# Patient Record
Sex: Female | Born: 1935 | Race: White | Hispanic: No | Marital: Married | Smoking: Never smoker
Health system: Southern US, Community
[De-identification: ages and names within clinical notes are randomized; demographics above are authoritative.]

## PROBLEM LIST (undated history)

## (undated) DIAGNOSIS — Z950 Presence of cardiac pacemaker: Secondary | ICD-10-CM

## (undated) DIAGNOSIS — I2510 Atherosclerotic heart disease of native coronary artery without angina pectoris: Secondary | ICD-10-CM

## (undated) DIAGNOSIS — Z8719 Personal history of other diseases of the digestive system: Secondary | ICD-10-CM

## (undated) DIAGNOSIS — D649 Anemia, unspecified: Secondary | ICD-10-CM

## (undated) DIAGNOSIS — J029 Acute pharyngitis, unspecified: Secondary | ICD-10-CM

## (undated) DIAGNOSIS — I10 Essential (primary) hypertension: Secondary | ICD-10-CM

## (undated) DIAGNOSIS — E785 Hyperlipidemia, unspecified: Secondary | ICD-10-CM

## (undated) DIAGNOSIS — R0989 Other specified symptoms and signs involving the circulatory and respiratory systems: Secondary | ICD-10-CM

## (undated) DIAGNOSIS — K219 Gastro-esophageal reflux disease without esophagitis: Secondary | ICD-10-CM

## (undated) DIAGNOSIS — K922 Gastrointestinal hemorrhage, unspecified: Secondary | ICD-10-CM

## (undated) DIAGNOSIS — I480 Paroxysmal atrial fibrillation: Secondary | ICD-10-CM

## (undated) DIAGNOSIS — I447 Left bundle-branch block, unspecified: Secondary | ICD-10-CM

## (undated) HISTORY — PX: COLONOSCOPY: SHX174

## (undated) HISTORY — DX: Paroxysmal atrial fibrillation: I48.0

## (undated) HISTORY — PX: CORONARY ANGIOPLASTY: SHX604

## (undated) HISTORY — DX: Gastro-esophageal reflux disease without esophagitis: K21.9

## (undated) HISTORY — DX: Essential (primary) hypertension: I10

## (undated) HISTORY — DX: Gastrointestinal hemorrhage, unspecified: K92.2

## (undated) HISTORY — PX: CATARACT EXTRACTION W/ INTRAOCULAR LENS  IMPLANT, BILATERAL: SHX1307

## (undated) HISTORY — PX: EYE SURGERY: SHX253

## (undated) HISTORY — DX: Left bundle-branch block, unspecified: I44.7

## (undated) HISTORY — DX: Hyperlipidemia, unspecified: E78.5

## (undated) HISTORY — DX: Other specified symptoms and signs involving the circulatory and respiratory systems: R09.89

## (undated) HISTORY — DX: Anemia, unspecified: D64.9

## (undated) HISTORY — DX: Atherosclerotic heart disease of native coronary artery without angina pectoris: I25.10

## (undated) HISTORY — PX: ESOPHAGOGASTRECTOMY: SHX1528

## (undated) SURGERY — ECHOCARDIOGRAM, TRANSESOPHAGEAL
Anesthesia: Moderate Sedation

---

## 1999-05-07 ENCOUNTER — Other Ambulatory Visit: Admission: RE | Admit: 1999-05-07 | Discharge: 1999-05-07 | Payer: Self-pay | Admitting: Internal Medicine

## 2002-05-25 ENCOUNTER — Ambulatory Visit (HOSPITAL_COMMUNITY): Admission: RE | Admit: 2002-05-25 | Discharge: 2002-05-25 | Payer: Self-pay

## 2002-07-22 ENCOUNTER — Other Ambulatory Visit: Admission: RE | Admit: 2002-07-22 | Discharge: 2002-07-22 | Payer: Self-pay | Admitting: Internal Medicine

## 2005-12-10 ENCOUNTER — Ambulatory Visit (HOSPITAL_COMMUNITY): Admission: RE | Admit: 2005-12-10 | Discharge: 2005-12-10 | Payer: Self-pay | Admitting: *Deleted

## 2006-08-07 ENCOUNTER — Inpatient Hospital Stay (HOSPITAL_COMMUNITY): Admission: EM | Admit: 2006-08-07 | Discharge: 2006-08-09 | Payer: Self-pay | Admitting: Emergency Medicine

## 2006-08-11 ENCOUNTER — Other Ambulatory Visit: Admission: RE | Admit: 2006-08-11 | Discharge: 2006-08-11 | Payer: Self-pay | Admitting: Internal Medicine

## 2006-09-10 ENCOUNTER — Encounter (INDEPENDENT_AMBULATORY_CARE_PROVIDER_SITE_OTHER): Payer: Self-pay | Admitting: *Deleted

## 2006-09-10 ENCOUNTER — Ambulatory Visit (HOSPITAL_COMMUNITY): Admission: RE | Admit: 2006-09-10 | Discharge: 2006-09-10 | Payer: Self-pay | Admitting: *Deleted

## 2008-01-26 ENCOUNTER — Ambulatory Visit (HOSPITAL_COMMUNITY): Admission: RE | Admit: 2008-01-26 | Discharge: 2008-01-26 | Payer: Self-pay | Admitting: *Deleted

## 2008-01-26 ENCOUNTER — Encounter (INDEPENDENT_AMBULATORY_CARE_PROVIDER_SITE_OTHER): Payer: Self-pay | Admitting: *Deleted

## 2008-10-18 ENCOUNTER — Ambulatory Visit (HOSPITAL_COMMUNITY): Admission: RE | Admit: 2008-10-18 | Discharge: 2008-10-18 | Payer: Self-pay | Admitting: *Deleted

## 2008-11-03 ENCOUNTER — Inpatient Hospital Stay (HOSPITAL_COMMUNITY): Admission: EM | Admit: 2008-11-03 | Discharge: 2008-11-04 | Payer: Self-pay | Admitting: Emergency Medicine

## 2008-11-08 ENCOUNTER — Encounter: Admission: RE | Admit: 2008-11-08 | Discharge: 2008-11-08 | Payer: Self-pay | Admitting: *Deleted

## 2009-10-03 ENCOUNTER — Other Ambulatory Visit: Admission: RE | Admit: 2009-10-03 | Discharge: 2009-10-03 | Payer: Self-pay | Admitting: Internal Medicine

## 2010-04-30 ENCOUNTER — Inpatient Hospital Stay (HOSPITAL_COMMUNITY): Admission: EM | Admit: 2010-04-30 | Discharge: 2010-04-30 | Payer: Self-pay | Admitting: Emergency Medicine

## 2010-04-30 ENCOUNTER — Encounter (INDEPENDENT_AMBULATORY_CARE_PROVIDER_SITE_OTHER): Payer: Self-pay | Admitting: Internal Medicine

## 2010-05-16 ENCOUNTER — Encounter: Admission: RE | Admit: 2010-05-16 | Discharge: 2010-05-16 | Payer: Self-pay | Admitting: Internal Medicine

## 2010-05-21 ENCOUNTER — Inpatient Hospital Stay (HOSPITAL_COMMUNITY): Admission: EM | Admit: 2010-05-21 | Discharge: 2010-05-23 | Payer: Self-pay | Admitting: Emergency Medicine

## 2010-07-23 ENCOUNTER — Encounter: Payer: Self-pay | Admitting: *Deleted

## 2010-09-11 LAB — CBC
HCT: 21.8 % — ABNORMAL LOW (ref 36.0–46.0)
HCT: 27 % — ABNORMAL LOW (ref 36.0–46.0)
HCT: 27.8 % — ABNORMAL LOW (ref 36.0–46.0)
HCT: 28.9 % — ABNORMAL LOW (ref 36.0–46.0)
Hemoglobin: 7.4 g/dL — ABNORMAL LOW (ref 12.0–15.0)
Hemoglobin: 9.1 g/dL — ABNORMAL LOW (ref 12.0–15.0)
Hemoglobin: 9.4 g/dL — ABNORMAL LOW (ref 12.0–15.0)
Hemoglobin: 9.8 g/dL — ABNORMAL LOW (ref 12.0–15.0)
MCH: 29.3 pg (ref 26.0–34.0)
MCH: 29.4 pg (ref 26.0–34.0)
MCH: 29.4 pg (ref 26.0–34.0)
MCH: 29.7 pg (ref 26.0–34.0)
MCH: 31.1 pg (ref 26.0–34.0)
MCHC: 33.7 g/dL (ref 30.0–36.0)
MCHC: 33.8 g/dL (ref 30.0–36.0)
MCHC: 33.9 g/dL (ref 30.0–36.0)
MCV: 86.8 fL (ref 78.0–100.0)
MCV: 86.8 fL (ref 78.0–100.0)
MCV: 87.3 fL (ref 78.0–100.0)
MCV: 87.7 fL (ref 78.0–100.0)
MCV: 91.6 fL (ref 78.0–100.0)
Platelets: 178 K/uL (ref 150–400)
Platelets: 180 K/uL (ref 150–400)
Platelets: 196 10*3/uL (ref 150–400)
Platelets: 207 K/uL (ref 150–400)
RBC: 2.38 MIL/uL — ABNORMAL LOW (ref 3.87–5.11)
RBC: 3.11 MIL/uL — ABNORMAL LOW (ref 3.87–5.11)
RBC: 3.17 MIL/uL — ABNORMAL LOW (ref 3.87–5.11)
RBC: 3.3 MIL/uL — ABNORMAL LOW (ref 3.87–5.11)
RBC: 3.33 MIL/uL — ABNORMAL LOW (ref 3.87–5.11)
RDW: 13.4 % (ref 11.5–15.5)
RDW: 16.7 % — ABNORMAL HIGH (ref 11.5–15.5)
RDW: 17 % — ABNORMAL HIGH (ref 11.5–15.5)
WBC: 5.3 K/uL (ref 4.0–10.5)
WBC: 5.4 K/uL (ref 4.0–10.5)
WBC: 5.7 K/uL (ref 4.0–10.5)

## 2010-09-11 LAB — CROSSMATCH
ABO/RH(D): B POS
Antibody Screen: NEGATIVE
Unit division: 0
Unit division: 0

## 2010-09-11 LAB — COMPREHENSIVE METABOLIC PANEL
ALT: 13 U/L (ref 0–35)
Albumin: 3.1 g/dL — ABNORMAL LOW (ref 3.5–5.2)
Creatinine, Ser: 0.51 mg/dL (ref 0.4–1.2)
Total Bilirubin: 2.6 mg/dL — ABNORMAL HIGH (ref 0.3–1.2)

## 2010-09-11 LAB — COMPREHENSIVE METABOLIC PANEL WITH GFR
ALT: 15 U/L (ref 0–35)
AST: 23 U/L (ref 0–37)
Albumin: 3.7 g/dL (ref 3.5–5.2)
Alkaline Phosphatase: 47 U/L (ref 39–117)
BUN: 19 mg/dL (ref 6–23)
CO2: 24 meq/L (ref 19–32)
Calcium: 9.3 mg/dL (ref 8.4–10.5)
Chloride: 111 meq/L (ref 96–112)
Creatinine, Ser: 0.61 mg/dL (ref 0.4–1.2)
GFR calc Af Amer: >60 mL/min (ref >60)
GFR calc non Af Amer: >60 mL/min (ref >60)
Glucose, Bld: 97 mg/dL (ref 70–99)
Potassium: 3.7 meq/L (ref 3.5–5.1)
Sodium: 140 meq/L (ref 135–145)
Total Bilirubin: 0.6 mg/dL (ref 0.3–1.2)
Total Protein: 6.3 g/dL (ref 6.0–8.3)

## 2010-09-11 LAB — URINALYSIS, ROUTINE W REFLEX MICROSCOPIC
Bilirubin Urine: NEGATIVE
Glucose, UA: NEGATIVE mg/dL
Hgb urine dipstick: NEGATIVE
Ketones, ur: NEGATIVE mg/dL
Nitrite: NEGATIVE
Protein, ur: NEGATIVE mg/dL
Specific Gravity, Urine: 1.008 (ref 1.005–1.030)
Urobilinogen, UA: 0.2 mg/dL (ref 0.0–1.0)
pH: 7 (ref 5.0–8.0)

## 2010-09-11 LAB — BASIC METABOLIC PANEL
BUN: 14 mg/dL (ref 6–23)
CO2: 24 mEq/L (ref 19–32)
Chloride: 113 mEq/L — ABNORMAL HIGH (ref 96–112)
Creatinine, Ser: 0.6 mg/dL (ref 0.4–1.2)
GFR calc Af Amer: >60 mL/min (ref >60)
Glucose, Bld: 103 mg/dL — ABNORMAL HIGH (ref 70–99)

## 2010-09-11 LAB — DIFFERENTIAL
Basophils Absolute: 0 K/uL (ref 0.0–0.1)
Basophils Relative: 0 % (ref 0–1)
Eosinophils Absolute: 0.1 K/uL (ref 0.0–0.7)
Eosinophils Relative: 1 % (ref 0–5)
Lymphocytes Relative: 37 % (ref 12–46)
Lymphs Abs: 2.1 K/uL (ref 0.7–4.0)
Monocytes Absolute: 0.4 K/uL (ref 0.1–1.0)
Monocytes Relative: 7 % (ref 3–12)
Neutro Abs: 3.1 K/uL (ref 1.7–7.7)
Neutrophils Relative %: 55 % (ref 43–77)

## 2010-09-11 LAB — GLUCOSE, CAPILLARY
Glucose-Capillary: 109 mg/dL — ABNORMAL HIGH (ref 70–99)
Glucose-Capillary: 114 mg/dL — ABNORMAL HIGH (ref 70–99)
Glucose-Capillary: 91 mg/dL (ref 70–99)

## 2010-09-11 LAB — PROTIME-INR
INR: 1.07 (ref 0.00–1.49)
INR: 1.14 (ref 0.00–1.49)
Prothrombin Time: 14.1 s (ref 11.6–15.2)
Prothrombin Time: 14.8 seconds (ref 11.6–15.2)

## 2010-09-11 LAB — CARDIAC PANEL(CRET KIN+CKTOT+MB+TROPI)
CK, MB: 1.3 ng/mL (ref 0.3–4.0)
CK, MB: 1.4 ng/mL (ref 0.3–4.0)
Relative Index: INVALID (ref 0.0–2.5)
Total CK: 55 U/L (ref 7–177)
Total CK: 57 U/L (ref 7–177)
Troponin I: 0.01 ng/mL (ref 0.00–0.06)

## 2010-09-11 LAB — TSH: TSH: 2.522 u[IU]/mL (ref 0.350–4.500)

## 2010-09-11 LAB — APTT: aPTT: 37 s (ref 24–37)

## 2010-09-11 LAB — ABO/RH: ABO/RH(D): B POS

## 2010-09-12 LAB — DIFFERENTIAL
Eosinophils Absolute: 0.1 10*3/uL (ref 0.0–0.7)
Eosinophils Relative: 2 % (ref 0–5)
Lymphs Abs: 4.1 10*3/uL — ABNORMAL HIGH (ref 0.7–4.0)
Monocytes Absolute: 0.5 10*3/uL (ref 0.1–1.0)
Monocytes Relative: 6 % (ref 3–12)

## 2010-09-12 LAB — BASIC METABOLIC PANEL
BUN: 10 mg/dL (ref 6–23)
Chloride: 107 mEq/L (ref 96–112)
Creatinine, Ser: 0.64 mg/dL (ref 0.4–1.2)

## 2010-09-12 LAB — CBC
MCH: 30.3 pg (ref 26.0–34.0)
MCV: 91.5 fL (ref 78.0–100.0)
Platelets: 287 10*3/uL (ref 150–400)
RDW: 13.2 % (ref 11.5–15.5)
WBC: 8 10*3/uL (ref 4.0–10.5)

## 2010-09-12 LAB — CARDIAC PANEL(CRET KIN+CKTOT+MB+TROPI)
Relative Index: INVALID (ref 0.0–2.5)
Total CK: 76 U/L (ref 7–177)

## 2010-09-12 LAB — HEPARIN LEVEL (UNFRACTIONATED): Heparin Unfractionated: 0.81 IU/mL — ABNORMAL HIGH (ref 0.30–0.70)

## 2010-09-12 LAB — APTT: aPTT: 29 seconds (ref 24–37)

## 2010-09-12 LAB — BRAIN NATRIURETIC PEPTIDE: Pro B Natriuretic peptide (BNP): 125 pg/mL — ABNORMAL HIGH (ref 0.0–100.0)

## 2010-09-12 LAB — PROTIME-INR: INR: 0.98 (ref 0.00–1.49)

## 2010-09-12 LAB — LIPID PANEL: Cholesterol: 149 mg/dL (ref 0–200)

## 2010-09-13 ENCOUNTER — Inpatient Hospital Stay (HOSPITAL_COMMUNITY)
Admission: RE | Admit: 2010-09-13 | Discharge: 2010-09-17 | DRG: 310 | Disposition: A | Discharge status: Home / Self Care | Payer: Medicare Other | Source: Ambulatory Visit | Attending: Cardiology | Admitting: Cardiology

## 2010-09-13 DIAGNOSIS — Z7982 Long term (current) use of aspirin: Secondary | ICD-10-CM

## 2010-09-13 DIAGNOSIS — I10 Essential (primary) hypertension: Secondary | ICD-10-CM | POA: Diagnosis present

## 2010-09-13 DIAGNOSIS — I447 Left bundle-branch block, unspecified: Secondary | ICD-10-CM | POA: Diagnosis present

## 2010-09-13 DIAGNOSIS — Z9861 Coronary angioplasty status: Secondary | ICD-10-CM | POA: Diagnosis present

## 2010-09-13 DIAGNOSIS — I4891 Unspecified atrial fibrillation: Principal | ICD-10-CM | POA: Diagnosis present

## 2010-09-13 DIAGNOSIS — H409 Unspecified glaucoma: Secondary | ICD-10-CM | POA: Diagnosis present

## 2010-09-13 DIAGNOSIS — I2510 Atherosclerotic heart disease of native coronary artery without angina pectoris: Secondary | ICD-10-CM | POA: Diagnosis present

## 2010-09-13 DIAGNOSIS — E876 Hypokalemia: Secondary | ICD-10-CM | POA: Diagnosis present

## 2010-09-13 DIAGNOSIS — I498 Other specified cardiac arrhythmias: Secondary | ICD-10-CM | POA: Diagnosis present

## 2010-09-13 DIAGNOSIS — K219 Gastro-esophageal reflux disease without esophagitis: Secondary | ICD-10-CM | POA: Diagnosis present

## 2010-09-13 DIAGNOSIS — E785 Hyperlipidemia, unspecified: Secondary | ICD-10-CM | POA: Diagnosis present

## 2010-09-13 DIAGNOSIS — Z79899 Other long term (current) drug therapy: Secondary | ICD-10-CM

## 2010-09-13 LAB — MAGNESIUM: Magnesium: 2.2 mg/dL (ref 1.5–2.5)

## 2010-09-13 LAB — PROTIME-INR: Prothrombin Time: 13 seconds (ref 11.6–15.2)

## 2010-09-13 LAB — BASIC METABOLIC PANEL
BUN: 9 mg/dL (ref 6–23)
Calcium: 10 mg/dL (ref 8.4–10.5)
GFR calc non Af Amer: >60 mL/min (ref >60)
Potassium: 3.9 mEq/L (ref 3.5–5.1)
Sodium: 142 mEq/L (ref 135–145)

## 2010-09-13 LAB — CBC
MCHC: 35.1 g/dL (ref 30.0–36.0)
MCV: 90.4 fL (ref 78.0–100.0)
Platelets: 239 10*3/uL (ref 150–400)
RDW: 13.3 % (ref 11.5–15.5)
WBC: 7.5 10*3/uL (ref 4.0–10.5)

## 2010-09-14 LAB — PROTIME-INR
INR: 1.06 (ref 0.00–1.49)
Prothrombin Time: 14 seconds (ref 11.6–15.2)

## 2010-09-14 LAB — HEPARIN LEVEL (UNFRACTIONATED): Heparin Unfractionated: 0.53 IU/mL (ref 0.30–0.70)

## 2010-09-14 NOTE — H&P (Signed)
Rhonda [name], Rhonda [name]               ACCOUNT NO.:  [id number]  MEDICAL RECORD NO.:  [id number]          PATIENT TYPE:  INP  LOCATION:  4710                         FACILITY:  [facility]  PHYSICIAN:  [place] Clemence Lengyel, MD         DATE OF BIRTH:  1930-03-10  DATE OF ADMISSION:  05/21/2010 DATE OF DISCHARGE:  05/23/2010                             HISTORY & PHYSICAL   CHIEF COMPLAINT:  Fast heart rate.  HISTORY OF PRESENT ILLNESS:  Rhonda [name] is a 77 year old female with a history of paroxysmal atrial fibrillation with a first episode in May 2010.  She had a second episode in October 2011, both of which were spontaneous conversion back to sinus rhythm.  In 2011, she was placed on Pradaxa.  Unfortunately, she had developed a subsequent GI bleed with a drop in hemoglobin from around 11 or 12 to 7.  After that episode, her anticoagulants was discontinued and she was kept on aspirin 81 mg.  She reports new-onset symptomatic atrial fibrillation last evening with some racing of her heart.  Due to low blood pressure and heart rate, she does not routinely take diltiazem, however, took the medication last night to slow the heart down.  Today, she presented to the office and was noted to be in atrial fibrillation with rapid ventricular response rate of 141.  She has a known left bundle branch block and therefore this is a wide complex irregular tachycardia.  PAST MEDICAL HISTORY:  Significant for hypertension, GERD, dyslipidemia and glaucoma.  She does have a history of coronary disease in the past and disease in the right coronary artery in 2008.  She underwent cardiac catheterization in 2010, which demonstrated a right dominant coronary artery system with moderate 50-60% disease in the right coronary that was predominantly unchanged from catheterization in 2008.  Although given the fact that she has known moderate coronary artery disease, she may not be a good candidate for certain typical  antiarrhythmic medications.  FAMILY HISTORY:  Significant for heart disease in both parents and three brothers.  SOCIAL HISTORY:  She is married with one child and one grandchild.  She does exercise three times a week at the [facility]  She is a nonsmoker. Denies alcohol or street drugs.  REVIEW OF SYSTEMS:  As per HPI.  ALLERGIES:  SULFA and intolerances to BETA BLOCKERS which causes bradycardia and NORVASC which causes dizziness.  MEDICATIONS: 1. Alphagan 0.15% 1 drop OD t.i.d. 2. Azor 5/40 mg 2 p.o. daily. 3. Calcium carbonate 3 daily. 4. Cosopt 1 drop OD b.i.d. 5. Lipitor 40 mg nightly. 6. Multivitamin daily. 7. Nitroglycerin p.r.n. 8. Protonix 40 mg p.o. daily. 9. Aspirin 81 mg daily. 10.Hydralazine 25 mg b.i.d. 11.Diltiazem 60 mg one q.8 h p.r.n. p.o. 12.Pataday 0.2% 1 drop OU daily. 13.Dorzolamide 10 mL 1 drop OD t.i.d. 14.Brimonidine 10 mL 1 drop OD t.i.d.  PHYSICAL EXAMINATION:  VITAL SIGNS:  Weight 126, pulse 141, blood pressure 160/90, height 5 feet 7 inches, BMI 19.5. GENERAL:  Awake, appears somewhat anxious and uncomfortable. HEENT:  Pupils equal, round, and reactive to light and accommodation. Extraocular muscles intact. NECK:  Supple.  No JVD, distention or carotid bruits. HEART:  Irregularly irregular and tachycardic. ABDOMEN:  Soft, nontender.  Bowel sounds present. EXTREMITIES:  Without edema.  EKG is a irregularly regular with a left bundle branch block demonstrates atrial fibrillation with rate of 141.  IMPRESSION: 1. Atrial fibrillation with rapid ventricular response.  PLAN:  Rhonda [name] has new-onset atrial fibrillation with rapid ventricular response.  The onset is less than 24 hours.  We discussed the possible management options.  At this point, I am leading toward hospitalization for IV heparin and plan to cardiovert today.  After cardioversion, we should strongly consider an antiarrhythmic medication. However, she does have known coronary  disease with a 50% RCA block, which has been stable by catheterization in 2010 and this may limit our antiarrhythmic options.  In addition, she has been intolerant to beta blockers, therefore we have to use medications such as sotalol with caution.  Flecainide may be also difficult to use given her underlying left bundle branch block.  We will also need to discuss with her options for ongoing anticoagulation.  Given her age of 36 and history of hypertension, she has least 2 traditional CHADS 2 to risk factors and therefore I would typically recommend Coumadin or Pradaxa, however, given her GI bleed in the past perhaps in addition of Plavix to her current dose of aspirin may be most beneficial.     [place] Ikran Patman, MD     CH/MEDQ  D:  09/13/2010  T:  09/13/2010  Job:  [id number]  cc:   Thereasa Solo. Little, M.D.  Electronically Signed by Kirtland Bouchard. Slevin Gunby M.D. on 09/14/2010 10:42:47 AM

## 2010-09-15 LAB — BASIC METABOLIC PANEL
CO2: 25 mEq/L (ref 19–32)
Calcium: 9.8 mg/dL (ref 8.4–10.5)
Creatinine, Ser: 0.56 mg/dL (ref 0.4–1.2)
GFR calc Af Amer: >60 mL/min (ref >60)
GFR calc non Af Amer: >60 mL/min (ref >60)
Glucose, Bld: 94 mg/dL (ref 70–99)

## 2010-09-15 LAB — CBC
HCT: 35.4 % — ABNORMAL LOW (ref 36.0–46.0)
Hemoglobin: 12.1 g/dL (ref 12.0–15.0)
MCH: 30.3 pg (ref 26.0–34.0)
MCHC: 34.2 g/dL (ref 30.0–36.0)
RDW: 13 % (ref 11.5–15.5)

## 2010-09-15 LAB — PROTIME-INR: Prothrombin Time: 13.7 seconds (ref 11.6–15.2)

## 2010-09-16 LAB — CBC
MCH: 31.1 pg (ref 26.0–34.0)
MCHC: 35 g/dL (ref 30.0–36.0)
Platelets: 177 10*3/uL (ref 150–400)
RDW: 13.1 % (ref 11.5–15.5)

## 2010-09-16 LAB — PROTIME-INR: Prothrombin Time: 17.5 seconds — ABNORMAL HIGH (ref 11.6–15.2)

## 2010-09-17 LAB — CBC
MCH: 30.7 pg (ref 26.0–34.0)
MCHC: 34.4 g/dL (ref 30.0–36.0)
MCV: 89.1 fL (ref 78.0–100.0)
Platelets: 183 10*3/uL (ref 150–400)
RDW: 13.1 % (ref 11.5–15.5)

## 2010-09-17 NOTE — Procedures (Signed)
  Rhonda [name], Rhonda [name]               ACCOUNT NO.:  [id number]  MEDICAL RECORD NO.:  [id number]           PATIENT TYPE:  LOCATION:                                 FACILITY:  PHYSICIAN:  Rhonda Solo. Lashya Passe, M.D. DATE OF BIRTH:  [birth year]  DATE OF PROCEDURE:  09/08/2010 DATE OF DISCHARGE:                           CARDIAC CATHETERIZATION   PROCEDURE:  Cardioversion.  INDICATIONS FOR TEST:  This 77 year old female has paroxysmal atrial fibrillation.  She was on Pradaxa because of this and had a substantial GI bleed requiring transfusion in November 2011.  She was seen by Dr. Bosie Clos with no GI source for the bleed being identified.  She has a remote history of coronary artery disease with an angioplasty in the 1990s.  Her most recent cath 2 years ago showed only mild occlusive coronary artery disease.  She had an echo done within the last year that showed left atrial dimension to be 2.8 cm.  Her ejection fraction is normal and her potassium was 3.9.  Around 10 o'clock last night, she developed the sudden onset of rapid atrial fibrillation.  She was still in rapid atrial fib this morning despite taking 30 mg of p.o. Cardizem.  She was seen in the office and sent to a Short Study for elective cardioversion within the first 24 hours of the onset of the atrial fibrillation and then admission to the hospital for anticoagulant and long-term antiarrhythmic treatment.  After obtaining 90 mg of bupivacaine, the patient obtained an appropriate level of anesthesia.  She underwent elective cardioversion with anterior and posterior pads and 120 watt seconds.  She converted from atrial fibrillation with a rate of 138 to sinus bradycardia with a rate of 53.  She is moving all 4 extremities following the cardioversion.  There was no evidence that she has had any type of embolic event.  She normally has a bradycardia in the low 50s.  I suspect what she really has is brady-tachy syndrome that  will probably ultimately require permanent pacemaker.  She has failed beta-blockers because of worsening bradycardia in the past.  She is not a good candidate for sotalol because of this and with her prior history of coronary artery disease flecainide is not a good option either.  I have asked the EP to render an opinion as to the best route of medical therapy.  In the interim, I will put her on Cardizem 30 mg q.8 h. with parameters to hold for significant bradycardia.  CONCLUSION:  Successful elective cardioversion from atrial fibrillation to sinus brady.          ______________________________ Rhonda [name], M.D.     ABL/MEDQ  D:  09/13/2010  T:  09/14/2010  Job:  [id number]  cc:   Soyla Murphy. Renne Crigler, M.D.  Electronically Signed by Julieanne Manson M.D. on 09/17/2010 04:26:05 PM

## 2010-09-19 ENCOUNTER — Other Ambulatory Visit: Payer: Self-pay | Admitting: *Deleted

## 2010-09-19 DIAGNOSIS — K921 Melena: Secondary | ICD-10-CM

## 2010-09-19 DIAGNOSIS — I4891 Unspecified atrial fibrillation: Secondary | ICD-10-CM

## 2010-09-19 MED ORDER — DOFETILIDE 500 MCG PO CAPS: 500.0000 ug | ORAL_CAPSULE | Freq: Two times a day (BID) | ORAL | Status: DC

## 2010-09-20 NOTE — Consult Note (Signed)
Rhonda [name], OGLESBY               ACCOUNT NO.:  [id number]  MEDICAL RECORD NO.:  [id number]           PATIENT TYPE:  I  LOCATION:  3703                         FACILITY:  [facility]  PHYSICIAN:  Duke Salvia, MD, FACCDATE OF BIRTH:  June 28, 1930  DATE OF CONSULTATION:  09/13/2010 DATE OF DISCHARGE:                                CONSULTATION   Thank you very much for asking [place] to see Ms. Candee Furbish in consultation because of atrial fibrillation.  She is a 77 year old lady with a history of paroxysmal atrial fibrillation dating back to May 2010 with a repeat hospitalization in October 2011, both of which were characterized by atrial fibrillation with rapid rates in the 130 range.  Anticoagulation with Pradaxa were interrupted by major GI bleeding, drop in hemoglobin from 11 down to 7. She was subsequently treated with aspirin.  At that juncture because of history of bradycardia and left bundle- branch block, it was elected to treat with p.r.n. calcium blockers. Last night she had recurrence of atrial fibrillation and I should note that all of her episodes of atrial fibrillation occurred at night which prompted her to take Cardizem.  Her heart rate slowed down.  She awakened this morning and had again significant symptoms related to this.  On arrival at [facility], her heart rate was 140.  Her blood pressure was stable at 160/90.  First 2 episodes of atrial fibrillation were characterized primarily by palpitations and anxiety.  There is mild shortness of breath.  There is no significant chest pain or fatigue.  There is also no lightheadedness or presyncope.  Her thromboembolic risk factors are notable for age - 2, hypertension - 1, and gender - 1 for CHAD-VAS score of 4 and her [place] score is 2.  Her other cardiac issues related to coronary artery disease for which she underwent POBA in the mid 1990s with nonobstructive coronary disease noted and  subsequently had repeat catheterization in 2008.  She has normal left ventricular function.  Her past medical history in addition to the above is notable for GE reflux disease, dyslipidemia, and glaucoma.  Her past surgical history is notable for glaucoma on one of her eyes.  Her family history is notable for coronary disease.  SOCIAL HISTORY:  She is married.  She has one child and one grandchild. She does not use cigarettes, alcohol, or recreational drugs.  She exercises regularly.  Her review of systems is negative apart from what is listed in the HPI and the past medical history.  OUTPATIENT MEDICATIONS: 1. Azor 540. 2. Cosopt eye drops. 3. Lipitor 40. 4. Protonix 40. 5. Aspirin 81. 6. Hydralazine 25 b.i.d. 7. __________ eye drops.  She is allergic to SULFA and BETA-BLOCKERS have caused bradycardia.  PHYSICAL EXAMINATION:  GENERAL:  She is an elderly Caucasian female appearing her stated age of 66.  She was in no acute distress and was well developed and well nourished. VITAL SIGNS:  Her blood pressure is 144/67.  Her pulse was 50, respirations were 18. HEENT:  Normal. NECK:  Veins were flat.  Her carotids are brisk and full bilaterally  without bruits. BACK:  Without kyphosis, scoliosis, or CVA tenderness. LUNGS:  Clear. HEART:  Sounds were regular but slow without murmurs or gallops. ABDOMEN:  Soft with active bowel sounds without midline pulsation or hepatomegaly. EXTREMITIES:  Femoral pulses were trace.  Distal pulses were intact. There is no clubbing, cyanosis, or edema. NEUROLOGICAL:  Grossly normal. SKIN:  Warm and dry. PSYCHIATRIC:  Her affect was engaging.  Electrocardiogram dated this afternoon demonstrated sinus rhythm at 50 with interval of 0.16/0.14/0.17 with a QTC of 0.44.  There was occasional sinus node arrest.  Electrocardiogram at 14:23 shortly after cardioversion demonstrated again sinus node arrest with actually every other beat not preceded  by a P-wave  Electrocardiogram at 15:53 hours demonstrated sinus rhythm at 61 with 1:1 AV conduction and P-waves across the electrocardiogram.  Laboratories are notable for potassium of 3.9, magnesium drawn in the fall was 2.5.  IMPRESSION: 1. Paroxysmal atrial fibrillation. 2. Bradycardia. 3. Left bundle-branch block. 4. Thromboembolic risk factors notable for hypertension, gender, and     age - 2. 5. Mild hypokalemia.  DISCUSSION:  We had a extensive discussion regarding strategies of atrial fibrillation management including rate control versus rhythm control.  As it relates to the former, she would certainly need backup brady pacing.  She would like to try and avoid this.  We talked about rhythm control as an alternative and the option would be for Tikosyn. We discussed potential for proarrhythmia and the need for 72 hours of hospitalization following initiation.  She understands this and would like to proceed along this route.  We also discussed the possibility of doing nothing; however, her anxiety I think precludes that as a possibility.  Based on the above, we will therefore: 1. Replete potassium. 2. Check magnesium. 3. Begin Tikosyn. 4. Continue her Coumadin anticoagulation.     Duke Salvia, MD, [facility]     SCK/MEDQ  D:  09/13/2010  T:  09/14/2010  Job:  [id number]  Electronically Signed by Sherryl Manges MD [facility] on 09/20/2010 08:36:03 AM

## 2010-09-30 NOTE — Discharge Summary (Signed)
NAMEVASHTI, [name]               ACCOUNT NO.:  [id number]  MEDICAL RECORD NO.:  [id number]           PATIENT TYPE:  I  LOCATION:  3703                         FACILITY:  [facility]  PHYSICIAN:  Thereasa Solo. Little, M.D. DATE OF BIRTH:  07-20-1929  DATE OF ADMISSION:  09/13/2010 DATE OF DISCHARGE:  09/17/2010                              DISCHARGE SUMMARY   DISCHARGE DIAGNOSES: 1. Atrial fibrillation with rapid ventricular response status post transesophageal echocardiography cardioversion on September 13, 2010, currently in sinus rhythm. 1. Hypertension. 2. Hypokalemia, repleted.  HOSPITAL COURSE:  Ms. Rhonda [name] is a 77 year old Caucasian female with history of paroxysmal atrial fibrillation, who is on Pradaxa.  Because of this, she had a substantial GI bleed, requiring transfusion in November 2011.  She was seen by Dr. Bosie Clos with no GI source of bleeding being identified.  She has a history of coronary artery disease with angioplasty back in the 1990s.  She had a cath approximately 2 years ago which showed only mild occlusive coronary artery disease.  She developed atrial fibrillation with an increased rate.  She has not been on anticoagulation because of her secondary to GI bleed.  She presented for elective cardioversion.  This was completed on September 13, 2010.  An EP consult was also requested, Dr. Graciela Husbands recommended she is to be started on Tikosyn.  On September 14, 2010, she stated she felt better.  She was also started on Coumadin.  She had no complaints throughout the weekend and she has been maintaining sinus rhythm.  Currently, her INR is 1.78, QTc interval of 0.468.  She had been seen by Dr. Clarene Duke who felt she was stable for discharge, follow up in 1 week.  She will be given 1 dose of Lovenox prior to discharge approximately at 1400 hours.  She will be also given increased dose of Coumadin at 7.5 mg and then she will start on 5 mg daily tomorrow, September 18, 2010.  She will follow up  with INR check in our office on Wednesday.  STUDIES/PROCEDURES: 1. Cardioversion version on September 13, 2010, was a successful     cardioversion from atrial fibrillation to sinus bradycardia.  DISCHARGE LABS:  WBC 4.7, hemoglobin 11.5, hematocrit 33.4, platelets 183.  INR 1.78.  PT 20.9.  Heparin level 0.52.  Sodium 136, potassium 3.7, chloride 106, carbon dioxide 25, glucose 94, BUN 17, creatinine 0.56, calcium of 9.8.  DISCHARGE MEDICATIONS: 1. Brimonidine 0.15% ophthalmic solution 1 drop in the right eye 2     times daily. 2. Pataday 0.2% ophthalmic both eyes 1 drop daily. 3. Tikosyn 500 mcg capsules 1 tablet twice daily. 4. Coumadin 5 mg p.o. daily. 5. Azor 5/40 mg 1 tablet by mouth in the morning. 6. Hydralazine 25 mg p.o. 1 tablet twice daily. 7. Lipitor 40 mg 1 tablet by mouth daily at bedtime. 8. Multivitamin over the counter 1 tablet daily. 9. Nitroglycerin sublingual 0.4 mg 1 tablet under the tongue every 5     minutes for chest pain up to 3 doses total. 10.Aspirin enteric coated 81 mg 1 tablet by mouth daily. 11.Protonix 40 mg 1 tablet by  mouth every morning. 12.Calcium carbonate over the counter 1 tablet by mouth 3 times daily. 13.Systane Ultra eyedrops 1 drop both eyes 3 times daily.  DISPOSITION:  Ms. [name] will be discharged home in stable condition, increase her activity slowly.  She is recommend she is to eat heart- healthy diet.  She will follow up with [facility] on Wednesday at 12:10 p.m. for an INR check and she was instructed to return for followup with Dr. Clarene Duke in 3 weeks on Monday, October 08, 2010, 11:40 a.m.    ______________________________ Wilburt Finlay, PA   ______________________________ Thereasa Solo. Little, M.D.    Jane Canary  D:  09/17/2010  T:  09/18/2010  Job:  [id number]  Electronically Signed by Wilburt Finlay PA on 09/27/2010 02:49:43 PM Electronically Signed by Julieanne Manson M.D. on 09/30/2010 10:27:05 AM

## 2010-10-09 LAB — COMPREHENSIVE METABOLIC PANEL
Alkaline Phosphatase: 52 U/L (ref 39–117)
BUN: 9 mg/dL (ref 6–23)
CO2: 26 mEq/L (ref 19–32)
Chloride: 109 mEq/L (ref 96–112)
GFR calc non Af Amer: >60 mL/min (ref >60)
Glucose, Bld: 112 mg/dL — ABNORMAL HIGH (ref 70–99)
Potassium: 3.8 mEq/L (ref 3.5–5.1)
Total Bilirubin: 0.7 mg/dL (ref 0.3–1.2)

## 2010-10-09 LAB — CARDIAC PANEL(CRET KIN+CKTOT+MB+TROPI)
CK, MB: 2.2 ng/mL (ref 0.3–4.0)
CK, MB: 2.5 ng/mL (ref 0.3–4.0)
CK, MB: 2.8 ng/mL (ref 0.3–4.0)
Relative Index: INVALID (ref 0.0–2.5)
Relative Index: INVALID (ref 0.0–2.5)
Total CK: 71 U/L (ref 7–177)
Total CK: 79 U/L (ref 7–177)
Total CK: 83 U/L (ref 7–177)
Total CK: 91 U/L (ref 7–177)
Troponin I: 0.03 ng/mL (ref 0.00–0.06)
Troponin I: 0.05 ng/mL (ref 0.00–0.06)
Troponin I: 0.06 ng/mL (ref 0.00–0.06)

## 2010-10-09 LAB — CBC
HCT: 29.9 % — ABNORMAL LOW (ref 36.0–46.0)
Hemoglobin: 10 g/dL — ABNORMAL LOW (ref 12.0–15.0)
MCHC: 33.7 g/dL (ref 30.0–36.0)
MCV: 83.8 fL (ref 78.0–100.0)
RBC: 3.55 MIL/uL — ABNORMAL LOW (ref 3.87–5.11)
RBC: 4.8 MIL/uL (ref 3.87–5.11)
WBC: 5.1 10*3/uL (ref 4.0–10.5)

## 2010-10-09 LAB — BASIC METABOLIC PANEL
BUN: 10 mg/dL (ref 6–23)
CO2: 29 mEq/L (ref 19–32)
Calcium: 8.9 mg/dL (ref 8.4–10.5)
Chloride: 105 mEq/L (ref 96–112)
Creatinine, Ser: 0.58 mg/dL (ref 0.4–1.2)
GFR calc Af Amer: >60 mL/min (ref >60)
GFR calc Af Amer: >60 mL/min (ref >60)
GFR calc non Af Amer: >60 mL/min (ref >60)
Glucose, Bld: 100 mg/dL — ABNORMAL HIGH (ref 70–99)
Glucose, Bld: 131 mg/dL — ABNORMAL HIGH (ref 70–99)
Potassium: 3.4 mEq/L — ABNORMAL LOW (ref 3.5–5.1)
Sodium: 140 mEq/L (ref 135–145)

## 2010-10-09 LAB — POCT CARDIAC MARKERS
CKMB, poc: 1.2 ng/mL (ref 1.0–8.0)
CKMB, poc: 1.7 ng/mL (ref 1.0–8.0)
Myoglobin, poc: 55.3 ng/mL (ref 12–200)
Troponin i, poc: <0.05 ng/mL (ref 0.00–0.09)

## 2010-10-09 LAB — CK TOTAL AND CKMB (NOT AT ARMC): Total CK: 77 U/L (ref 7–177)

## 2010-10-09 LAB — URINALYSIS, ROUTINE W REFLEX MICROSCOPIC
Bilirubin Urine: NEGATIVE
Hgb urine dipstick: NEGATIVE
Ketones, ur: 15 mg/dL — AB
Nitrite: NEGATIVE
Specific Gravity, Urine: 1.036 — ABNORMAL HIGH (ref 1.005–1.030)
pH: 6 (ref 5.0–8.0)

## 2010-10-09 LAB — DIFFERENTIAL
Basophils Relative: 1 % (ref 0–1)
Eosinophils Absolute: 0.1 10*3/uL (ref 0.0–0.7)
Monocytes Absolute: 0.5 10*3/uL (ref 0.1–1.0)
Neutrophils Relative %: 51 % (ref 43–77)

## 2010-10-09 LAB — HEMOGLOBIN A1C
Hgb A1c MFr Bld: 5.1 % (ref 4.6–6.1)
Mean Plasma Glucose: 100 mg/dL

## 2010-10-09 LAB — PROTIME-INR: Prothrombin Time: 13.4 seconds (ref 11.6–15.2)

## 2010-11-13 NOTE — Discharge Summary (Signed)
Rhonda [name], [place]               ACCOUNT NO.:  [id number]   MEDICAL RECORD NO.:  [id number]          PATIENT TYPE:  INP   LOCATION:  3108                         FACILITY:  [facility]   PHYSICIAN:  Antionette Char, MD    DATE OF BIRTH:  11/30/1929   DATE OF ADMISSION:  11/03/2008  DATE OF DISCHARGE:  11/04/2008                               DISCHARGE SUMMARY   FINAL DIAGNOSES:  1. Atrial fibrillation, new onset, now converted to sinus.  2. Coronary artery disease, stable, low risk.  3. Bradycardia, improved, off beta-blocker.  4. Hypertension, controlled.  5. Chest pain, atypical, noncardiac.   OPERATION DURING ADMISSION:  Cardiac cath by Dr. Mariah Milling on Nov 03, 2008.   REASON FOR ADMISSION:  This 77 year old female was admitted to [facility] on Nov 03, 2008, after the onset of palpitations, which  awakened her from sleep with pounding in her chest rapidly.  She had no  chest pain at that time, but went to the emergency room where she was  found to be in atrial fibrillation with a rapid ventricular response.  She was then admitted for medical treatment.   PAST HISTORY:  Coronary artery disease, single vessel, status post  angioplasty of her distal right coronary artery in 1994 and mild  restenosis in the middle segment.   HOSPITAL COURSE:  The patient's heart rate was controlled with  intravenous Cardizem and her palpitations improved.  She did complain of  some lower left chest pain, which radiated laterally into her back.  Her  cardiac enzymes were negative.  Her chest pain had had recent onset and  she had actually been scheduled for a cardiac stress test on the day of  admission.  With the new onset of this chest pain plus the new-onset  atrial fibrillation and past history of coronary artery disease, she was  felt to be a candidate for cardiac evaluation with cardiac cath to  define her coronary status.  She then underwent a cardiac cath on Nov 03, 2008, at noon and  findings were very similar to her findings in 2008  when she had moderate restenosis within the distal right coronary artery  stent and a new moderate stenosis in her mid right coronary artery.  This was then essentially unchanged and was felt to be stable and  represent a low risk cardiovascular status.  Following this, the patient  had a slow heart rate and mildly decreased blood pressure and her  Bystolic was held.  Her heart rate went down in the 30s also and her  Cardizem drip was discontinued.  She then converted from atrial  fibrillation and had a sinus bradycardia.  This sinus bradycardia has  improved gradually off the Bystolic and now is in the low 60s.  Her  blood pressure has also improved and she is now asymptomatic and felt to  be stable.  We would discharge her home today in improved and stable  condition.   DISPOSITION:  Followup appointment in 2 weeks with Dr. Aleen Campi in the  office.   DIET:  Heart-healthy diet.  ACTIVITY:  No restrictions.  Resume full activity with her exercise  program.   MEDICATIONS:  1. Lipitor 40 mg daily.  2. Lotrel 10/40 mg daily.  3. Allegra 180 mg daily.  4. Protonix 40 mg daily.  5. Iron tablet daily.  6. Boniva injection every 3 months.  7. Cosopt eye drops 1 right eye b.i.d.  8. Alphagan P eye drops 1 right eye b.i.d.  9. Aspirin 81 mg daily.  10.She was instructed to stop the Bystolic.   CONDITION ON DISCHARGE:  Improved and stable, now converted  spontaneously out of atrial fibrillation.      Antionette Char, MD  Electronically Signed     JRT/MEDQ  D:  11/04/2008  T:  11/05/2008  Job:  [id number]   cc:   Soyla Murphy. Renne Crigler, M.D.

## 2010-11-13 NOTE — Cardiovascular Report (Signed)
[name], [name] NO.:  [id number]   MEDICAL RECORD NO.:  [id number]          PATIENT TYPE:  INP   LOCATION:  3108                         FACILITY:  [facility]   PHYSICIAN:  Antonieta Iba, MD   DATE OF BIRTH:  1930-05-26   DATE OF PROCEDURE:  DATE OF DISCHARGE:                            CARDIAC CATHETERIZATION   PERFORMING PHYSICIAN:  Antonieta Iba, MD   REFERRING CARDIOLOGIST:  Antionette Char, MD   REASON FOR PROCEDURE:  Ms. Rhonda [name] is a pleasant 77 year old woman  with a history of mid RCA disease, estimated at 50-60% on previous  catheterizations in 2008 who presents to [facility] with chest  discomfort, new atrial fibrillation, and RVR.  She was brought to the  cardiac catheterization lab for further evaluation of her coronary  anatomy.   PROCEDURE DETAILS:  The risks and benefits of the procedure were  discussed with the patient.  The patient was brought to the cardiac  catheterization and prepped and draped in the usual sterile fashion.  The modified Seldinger technique was used to engage the right femoral  artery.  A 5-French sheath was introduced.  A 5-French JL4 catheter and  a JR4 catheter were used to engage the left main and the RCA  respectively.  A hand injection of contrast was used with cinematography  was recorded.  A 5-French pigtail catheter was used to cross the aortic  valve and for LV gram, and pullback recorded the aortic valve pressures.  At the end of the case, the sheath was removed and manual pressure held.  At the time of this dictation, no complications were reported.   CORONARY ANATOMY:  Left main; left main is a moderate to large size  vessel that bifurcates into the LAD and the left circumflex.  There is  no significant disease noted.   Left anterior descending; the left anterior descending is a moderate-to-  large size vessel that extends distally that tapers at the apex.  There  are several small  diagonal branches.  There is no significant disease  noted.   Left circumflex; the left circumflex is a moderate-sized vessel with a  distal bifurcating obtuse marginal branch.  There is no significant  disease noted.  There is a small continuing A-V groove circumflex, so  this is small in nature.   Right coronary artery; the RCA is a dominant branch that bifurcates  distally with a PL and PDA branch noted.  There is a lesion in the mid  RCA that is estimated at 50 and at most 60% that is relatively focal in  nature and around the region of the takeoff of a marginal branch.  Otherwise, there is no significant disease noted.   LV gram shows normal systolic function with estimated ejection fraction  of greater than 55%.  There is no significant mitral regurgitation and  no significant aortic stenosis on pullback of the catheter across the  aortic valve.   FINAL IMPRESSION:  Right dominant coronary artery system with moderate  disease noted in the mid right coronary artery that appears to  be  unchanged from previous catheterization in 2008.  Otherwise, no  significant  disease noted.  Her blood pressure was low with systolics in the 80s.  After very mild sedation during the case, this resulted in slightly  slower flow in the coronary arteries, most notably in the left coronary  system.  This flow improved as her blood pressure also improved.  Details of the case will be discussed with Dr. Aleen Campi.      Antonieta Iba, MD  Electronically Signed     TJG/MEDQ  D:  11/03/2008  T:  11/04/2008  Job:  [id number]

## 2010-11-13 NOTE — H&P (Signed)
NAMEEDELINE, GREENING               ACCOUNT NO.:  [id number]   MEDICAL RECORD NO.:  [id number]          PATIENT TYPE:  INP   LOCATION:  3108                         FACILITY:  [facility]   PHYSICIAN:  Antionette Char, MD    DATE OF BIRTH:  12-08-1929   DATE OF ADMISSION:  11/03/2008  DATE OF DISCHARGE:                              HISTORY & PHYSICAL   CHIEF COMPLAINT:  Palpitations.   HISTORY OF PRESENT ILLNESS:  A 77 year old white married female  presented to the emergency room around midnight with palpitations.  She  was awakened from sleep with pounding and fast heart rate.  She could  feel her heart beating rapidly in her chest.  She had no chest  discomfort at that time and presented to the emergency room.  In the ER  she was found to be atrial fibrillation, rapid ventricular response,  heart rate 132 with left bundle branch block which is old.  She received  15 mg IV Cardizem dripped at 5 mg an hour and then increased to 10 mg an  hour.  We have now given her another 10 mg bolus for her continued  elevated heart rate.  Currently she is on Cardizem drip at 10 mg an  hour.  She does have some chest discomfort left anterior chest with  radiation around to her back.  She has had this discomfort starting last  week.  Has not felt well this week and actually had seen Marciano Sequin,  the nurse practitioner for Dr. Aleen Campi and she was scheduled for a  Myoview study as [facility] today.   Additionally, the patient has a history of Barrett's esophagus and  because of this similar type of pain she has had in the past she just  recently underwent EGD in April and colonoscopy with Dr. Virginia Rochester.  She had  some old blood in her stomach but no other blood source was seen and her  colonoscopy was negative.   PAST MEDICAL HISTORY:  Coronary disease with single vessel disease with  angioplasty to the distal RCA in 1994 and has done well.  Her last cath  was in February of 2008.   She had a new right coronary lesion 50-60% in  the mid segment of the RCA.  The prior angioplasty site had been obtained.  A nuclear stress test was  done which was negative for ischemia.  She has been treated medically  for coronary disease since that time.  Cath also showed EF of 70% and  LVH.  She recently underwent 2-D echo through Dr. Adelene Idler office and  was told it was stable.  Other history hypertension, osteoporosis,  dyslipidemia with controlled HDL, last check was 62 with an LDL of 61.  Recent anemia.  Hemoglobin was 8 back in March and she has been on iron  since March and hemoglobin today is 13.   ALLERGIES:  SULFA.   OUTPATIENT MEDS:  1. Lipitor 40 daily.  2. Lotrel 10/40 daily.  This was just increased 2 weeks ago from 5/20      daily.  3.  Allegra 180 daily.  4. Protonix 40 daily.  5. Iron one daily.  Ferrous sulfate constipates the patient.  She was      getting prescription iron by Dr. Renne Crigler, unsure of the name and she      takes that once daily and it has not caused any constipation.  6. She takes Boniva injections for her osteoporosis every 3 months.      Boniva p.o. for her osteoporosis.  7. Bystolic 10 mg daily.  8. Cosopt eye drops one right eye twice a day.  9. Alphagan P eye drops one right eye twice a day.  10.Aspirin 81 mg daily.   FAMILY HISTORY:  Strongly positive for coronary disease.  Mother and  father both have coronary disease.  She has five siblings with coronary  disease.  Her youngest brother had sudden death at 70 due to an MI.  One  brother with bypass graft.  One brother with electrical problem of his  cardiac system.  The oldest brother had bypass in '76 and now has ICD  pacemaker and a sister had bypass grafting, redo bypass and then died  with pneumonia.   SOCIAL HISTORY:  She is married with one daughter, one grandson.  No  tobacco, no alcohol use.  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  Has not gone this week  due to not feeling well.   REVIEW OF SYSTEMS:  GENERAL:  Some low-grade fever over the weekend last  week, none recently in the last few days.  SKIN:  No rashes.  HEENT:  No  blurred vision.  No sinus infections.  CARDIOVASCULAR:  See HPI.  PULMONARY: No shortness breath.  GI:  Constipation with iron.  Otherwise  no melena that she is aware of.  Recent chest pain.  GU:  No hematuria.  ENDOCRINE:  No diabetes.  No thyroid disease.  MUSCULOSKELETAL:  Osteoporosis.  NEURO:  One episode of lightheadedness when her pain  started over the weekend or last week.   PHYSICAL EXAMINATION:  VITAL SIGNS:  Today initially blood pressure  160/86, pulse 130, respirations 20, temperature 97.8.  Blood pressure  now 139/78, heart rate 70s to 80s though with activity up to 115.  GENERAL:  Alert, oriented, pleasant white female in no acute distress.  SKIN:  Warm and dry.  Brisk capillary refill.  HEENT:  Sclerae clear.  Normocephalic.  NECK:  Supple.  No JVD, no bruits.  HEART:  S1-S2, irregularly irregular.  LUNGS:  Clear without rales, rhonchi or wheezes.  ABDOMEN:  Soft, nontender, positive bowel sounds.  Do not palpate liver,  spleen or masses.  LOWER EXTREMITIES:  With trace edema at the ankles only, posterior tib  pulses are present.  NEURO:  Alert and oriented x3.  Moves all extremities.  Follows  commands.   LABS:  Sodium 141, potassium 3.9, BUN 10, creatinine 0.58, glucose 131.  Pro-time 13.4, INR 1, myoglobin 67 initially and 55.3 on the second one.  CK-MB initially 1.7 and 1.2 on the second one.  Troponin I less than  0.05 x2.  Hemoglobin 13.6, hematocrit 40, platelets 257, WBC 6.8.  EKG  atrial fibrillation with rapid ventricular response and left bundle  branch block.  Portable chest x-ray no acute process and changes  compatible with COPD and probable bronchiectasis at both lung bases.   IMPRESSION:  1. Atrial fibrillation with rapid ventricular response.  2. Coronary disease with  remote angioplasty RCA  1994 and 50-60% known      stenosis to the RCA in 2008 but negative nuclear at that time.  3. Recent chest discomfort and now mild chest discomfort with negative      EGD.  Was for stress Myoview today.  4. Barrett's esophagus with negative EGD April 2010.  5. Hypertension.  6. Recent anemia on iron.  7. Dyslipidemia.   PLAN:  Atrial fibrillation could be related to ischemia with known  coronary disease in the RCA as well as recent episodes of chest  discomfort currently on Cardizem drip.  If heart rate remains low may  need to discontinue Cardizem and change to IV amiodarone.  The patient  went into the rhythm seemingly around 11:00 tonight.  Prior to  that she noticed no arrhythmias.  We will also add IV heparin monitoring  for any bleeding as well as CK-MB and troponin I, possible cath versus  stress test today so will keep n.p.o.  Dr. Aleen Campi will see her this  morning.  Additionally, if no conversion to sinus may need  cardioversion.      Darcella Gasman. Annie Paras, N.P.      Antionette Char, MD  Electronically Signed    LRI/MEDQ  D:  11/03/2008  T:  11/03/2008  Job:  [id number]

## 2010-11-13 NOTE — Op Note (Signed)
Rhonda [name], Rhonda [name]               ACCOUNT NO.:  [id number]   MEDICAL RECORD NO.:  [id number]          PATIENT TYPE:  AMB   LOCATION:  ENDO                         FACILITY:  [facility]   PHYSICIAN:  Georgiana Spinner, M.D.    DATE OF BIRTH:  06/08/1930   DATE OF PROCEDURE:  10/18/2008  DATE OF DISCHARGE:                               OPERATIVE REPORT   PROCEDURE:  Colonoscopy.   INDICATIONS:  Hemoccult positivity.   ANESTHESIA:  Fentanyl 25 mcg, Versed 2 mg.   PROCEDURE:  With the patient mildly sedated in the left lateral  decubitus position the Pentax videoscopic pediatric colonoscope was  passed under direct vision through a very tortuous colon.  We were able  to finally reach the cecum, identified by ileocecal valve and  appendiceal orifice both of which were photographed. From this point the  colonoscope was slowly withdrawn, taking circumferential views of  colonic mucosa, stopping only in the rectum which appeared normal on  direct and retroflexed view. The endoscope was straightened and  withdrawn.  The patient's vital signs and pulse oximeter remained  stable.  The patient tolerated procedure well without apparent  complications.   FINDINGS:  Negative examination but very tortuous, long colon.   PLAN:  See endoscopy note for further details.           ______________________________  Georgiana Spinner, M.D.     GMO/MEDQ  D:  10/18/2008  T:  10/18/2008  Job:  [id number]

## 2010-11-13 NOTE — Op Note (Signed)
NAMEEDOM, SCHMUHL               ACCOUNT NO.:  [id number]   MEDICAL RECORD NO.:  [id number]          PATIENT TYPE:  AMB   LOCATION:  ENDO                         FACILITY:  [facility]   PHYSICIAN:  Georgiana Spinner, M.D.    DATE OF BIRTH:  1929/08/24   DATE OF PROCEDURE:  10/18/2008  DATE OF DISCHARGE:                               OPERATIVE REPORT   PROCEDURE:  Upper endoscopy.   INDICATIONS:  Hemoccult positivity.   ANESTHESIA:  Fentanyl 50 mcg, Versed 4 mg.   DESCRIPTION OF PROCEDURE:  With the patient mildly sedated in the left  lateral decubitus position the Pentax videoscopic endoscope was inserted  in the mouth, passed under direct vision through the esophagus which  appeared normal except for one small area of Barrett's which had been  seen previously which we photographed only today.  Advance into the  stomach.  Fundus, body, antrum, duodenal bulb, second portion duodenum  were visualized . From this point the endoscope was slowly withdrawn  taking circumferential views of the duodenal mucosa until the endoscope  had been pulled back into the stomach and placed in retroflexion to view  the stomach from below.  The endoscope was straightened and withdrawn  taking circumferential views of remaining gastric and esophageal mucosa  stopping to photograph the fundus once again where two small flecks of  blood were seen.  The endoscope was withdrawn.  The patient's vital  signs and pulse oximeter remained stable.  The patient tolerated  procedure well without apparent complications.   FINDINGS:  1. Small area of Barrett's esophagus known previously.  2. Two small flecks of blood in the stomach, otherwise unremarkable      exam.   PLAN:  Proceed to colonoscopy.           ______________________________  Georgiana Spinner, M.D.     GMO/MEDQ  D:  10/18/2008  T:  10/18/2008  Job:  [id number]

## 2010-11-13 NOTE — Op Note (Signed)
NAMEYOSHIKA, Rhonda [name]               ACCOUNT NO.:  [id number]   MEDICAL RECORD NO.:  [id number]          PATIENT TYPE:  AMB   LOCATION:  ENDO                         FACILITY:  [facility]   PHYSICIAN:  Georgiana Spinner, M.D.    DATE OF BIRTH:  20-Nov-1929   DATE OF PROCEDURE:  01/26/2008  DATE OF DISCHARGE:                               OPERATIVE REPORT   PROCEDURE:  Upper endoscopy.   INDICATIONS:  Barrett's esophagus.   ANESTHESIA:  Fentanyl 50 mcg, Versed 5 mg.   PROCEDURE:  With the patient mildly sedated, in the left lateral  decubitus position, the Pentax videoscopic endoscope was inserted in the  mouth and passed under direct vision through the esophagus which  appeared normal until we reached distal esophagus and there were 2 small  islands of Barrett's esophagus tissue photographed and biopsied.  We  entered into the stomach; fundus, body, antrum, duodenal bulb, second  portion duodenum appeared normal.  From this point, the endoscope was  slowly withdrawn taking circumferential views of duodenal mucosa until  the endoscope had been pulled back into stomach, placed in retroflexion  to view the stomach from below.  The endoscope was straightened and  withdrawn taking circumferential views of the remaining gastric and  esophageal mucosa.  The patient's vital signs and pulse oximeter  remained stable.  The patient tolerated the procedure well without  apparent complication.   FINDINGS:  Two small islands of Barrett's esophagus biopsied.  Await  biopsy report.  Of note, there were small amounts of flecks of blood in  the stomach seen as well, but no other lesions noted.   PLAN:  Await biopsy report.  The patient will call me for results and  follow-up with me as needed as an outpatient.           ______________________________  Georgiana Spinner, M.D.     GMO/MEDQ  D:  01/26/2008  T:  01/26/2008  Job:  [id number]

## 2010-11-16 NOTE — Discharge Summary (Signed)
[name], [name]               ACCOUNT NO.:  [id number]   MEDICAL RECORD NO.:  [id number]          PATIENT TYPE:  INP   LOCATION:  6526                         FACILITY:  [facility]   PHYSICIAN:  Nanetta Batty, M.D.   DATE OF BIRTH:  1929/10/24   DATE OF ADMISSION:  08/07/2006  DATE OF DISCHARGE:  08/09/2006                               DISCHARGE SUMMARY   DISCHARGE DIAGNOSES:  1. Chest pain consistent with unstable angina.  2. Moderate coronary disease with catheterization this admission.  3. Prior right coronary artery intervention.  4. Treated hypertension.  5. Dyslipidemia.  6. Left bundle branch block.  7. Glaucoma.   HOSPITAL COURSE:  The patient is a 77 year old female who has had remote  PCI in the 90s to the RCA.  She is doing well from a cardiac standpoint.  She presented with chest pain, August 07, 2006, that was worrisome for  unstable angina.  She was unable to remember if her symptoms were  similar to her pre-PCI symptoms.  She was admitted by Dr. Laurice Record and  cath, the next day by Dr. Aleen Campi.  Catheterization by Dr. Aleen Campi,  August 08, 2006, showed a 50-60% RCA in mid vessel.  Prior distal RCA  angioplasty site looked okay.  She has a normal left coronary, normal LV  function.  She did receive Angio-Seal.  The patient underwent Cardiolite  study the next day on August 09, 2006.  These results are pending, but  she will be discharged later today if this is negative for ischemia.  We  did add a PPI to her current medications.   DISCHARGE MEDICATIONS:  1. Atenolol 50 mg a day.  2. Lipitor 40 mg a day.  3. Lotrel 5/20 daily.  4. Aspirin daily.  5. Alphagan and Trusopt ophthalmic drops.  6. Prevacid 20 mg a day.   LABORATORY:  White count 5.2, hemoglobin 11.7, hematocrit 33.4,  platelets 165.  Sodium 138, potassium 3.1, BUN 10, creatinine 0.5,  troponin are negative x2, LFTs are normal, LDL was 61, HDL 55, D-dimer  is less than 0.22.  Hemoglobin A1c is 5.5,  TSH is 1.47.  H. pylori less  than 0.4.  INR is 1.1, EKG shows sinus rhythm with left bundle branch  block.   DISPOSITION:  The patient will be discharged later today pending her  Cardiolite results.  She will follow up with Dr. Aleen Campi if this is  negative.      Abelino Derrick, P.A.      Nanetta Batty, M.D.  Electronically Signed    LKK/MEDQ  D:  08/09/2006  T:  08/10/2006  Job:  [id number]   cc:   Antionette Char, MD

## 2010-11-16 NOTE — Op Note (Signed)
NAMESANAYA, GWILLIAM               ACCOUNT NO.:  [id number]   MEDICAL RECORD NO.:  [id number]          PATIENT TYPE:  AMB   LOCATION:  ENDO                         FACILITY:  [facility]   PHYSICIAN:  Georgiana Spinner, M.D.    DATE OF BIRTH:  [birth year]   DATE OF PROCEDURE:  DATE OF DISCHARGE:                                 OPERATIVE REPORT   PROCEDURE:  Colonoscopy.   INDICATIONS:  Colon cancer screening.   ANESTHESIA:  Demerol 50, Versed 5 mg.   PROCEDURE:  With the patient mildly sedated in the left lateral decubitus  position the Olympus videoscopic colonoscope was inserted in the rectum and  passed under direct vision to the cecum identified by ileocecal valve and  appendiceal orifice, both which were photographed.  From this point  colonoscope was slowly withdrawn taking circumferential views of colonic  mucosa stopping only in the rectum which appeared normal on direct and  retroflexed view.  The endoscope was straightened and withdrawn.  The  patient's vital signs, pulse oximeter remained stable.  The patient  tolerated procedure well without apparent complications.   FINDINGS:  Diverticulosis of the sigmoid colon.  Otherwise an unremarkable  examination.   PLAN:  Have patient follow-up with me in 5-10 years as needed.           ______________________________  Georgiana Spinner, M.D.     GMO/MEDQ  D:  12/10/2005  T:  12/10/2005  Job:  [id number]

## 2010-11-16 NOTE — Op Note (Signed)
NAMEOREATHA, FABRY               ACCOUNT NO.:  [id number]   MEDICAL RECORD NO.:  [id number]          PATIENT TYPE:  AMB   LOCATION:  ENDO                         FACILITY:  [facility]   PHYSICIAN:  Georgiana Spinner, M.D.    DATE OF BIRTH:  December 08, 1929   DATE OF PROCEDURE:  09/10/2006  DATE OF DISCHARGE:  09/10/2006                               OPERATIVE REPORT   PROCEDURE:  Upper endoscopy.   INDICATIONS:  Abdominal pain.   ANESTHESIA:  Fentanyl 25 mcg, Versed 3 mg.   PROCEDURE:  With the patient mildly sedated in the left lateral  decubitus position, the Pentax videoscopic endoscope was inserted in the  mouth and passed under direct vision through the esophagus which  appeared normal until we reached distal esophagus and there was a  questionable area of an island of Barrett's esophagus, photographed and  biopsied.  We entered into the stomach.  Fundus, body, antrum, duodenal  bulb, second portion duodenum appeared normal.  From this point, the  endoscope was slowly withdrawn taking circumferential views of duodenal  mucosa until the endoscope had been pulled back into the stomach and  placed in retroflexion to view the stomach from below.  The endoscope  was straightened and withdrawn taking circumferential views of the  remaining gastric and esophageal mucosa.  The patient's vital signs and  pulse oximeter remained stable.  The patient tolerated procedure well  without apparent complications.   FINDINGS:  Question of Barrett's esophagus biopsied.  Await biopsy  report.  The patient will call me for results and follow-up with me as  an outpatient.           ______________________________  Georgiana Spinner, M.D.     GMO/MEDQ  D:  09/11/2006  T:  09/12/2006  Job:  [id number]

## 2010-11-16 NOTE — Cardiovascular Report (Signed)
Rhonda [name], Rhonda [name]               ACCOUNT NO.:  [id number]   MEDICAL RECORD NO.:  [id number]          PATIENT TYPE:  INP   LOCATION:  6526                         FACILITY:  [facility]   PHYSICIAN:  Antionette Char, MD    DATE OF BIRTH:  Mar 21, 1930   DATE OF PROCEDURE:  08/08/2006  DATE OF DISCHARGE:                            CARDIAC CATHETERIZATION   PROCEDURES:  1. Left heart catheterization.  2. Coronary cineangiography.  3. Intracoronary nitroglycerin injection.  4. Left ventricular cine angiogram.  5. AngioSeal of the right femoral artery.   INDICATIONS FOR PROCEDURE:  This 77 year old female has a history of  single-vessel coronary artery disease with angioplasty of her distal  right coronary artery in 1994.  She has had a repeat cardiac cath since  then showing very good long-term results in the distal segment lesion.  She has had negative cardiac stress test.  Yesterday, she presented with  the onset of anterior chest pressure which was somewhat relieved with  belching and somewhat atypical.  She was sent to the emergency room  where her electrocardiogram showed her chronic left bundle branch block  and her cardiac enzymes and cardiac markers were negative.  She was  admitted overnight because of her past history of coronary artery  disease and new onset of chest pain.  She was also scheduled for cardiac  cath today.   DESCRIPTION OF PROCEDURE:  After signing an informed consent, the  patient was premedicated with 5 mg of Valium by mouth and brought to the  cardiac catheterization lab at [facility].  Her right groin was  prepped and draped in a sterile fashion and anesthetized locally with 1%  lidocaine.  A 6-French introducer sheath was inserted percutaneously  into the right femoral artery.  A 6-French #4 Judkins coronary catheters  were used to make injections into the native coronary arteries.  Catheter tip spasm was noted in the ostium of the right coronary  artery  and the 6-French catheter was exchanged for a 4-French #4 Judkins  coronary catheter.  Further injections were made into the right coronary  artery and 200 micrograms of intracoronary nitroglycerin were given.  Follow-up injections into the right coronary artery showed partial  relief of the spasm and a residual 30% stenosis.  It also made the  midsegment lesion more prominent.  We then exchanged this catheter for a  6-French pigtail catheter which was advanced to the root of the aorta  and left ventricle.  Pressures were recorded and injections were made in  the left ventricle.  The patient tolerated the procedure well and no  complications were noted at the end of the procedure.  The catheter and  sheath removed from the right femoral artery and hemostasis was easily  obtained with an AngioSeal closure system.   MEDICATIONS GIVEN:  Intracoronary nitroglycerin 200 mcg in right  coronary artery.   HEMODYNAMIC DATA:  There is no gradient across the aortic valve.   CINE FINDINGS CORONARY CINEANGIOGRAPHY:  Left coronary artery:  The  ostium of the left main appeared normal.  Left anterior descending  appears normal.  Circumflex coronary artery appears normal.  Right  coronary artery:  Initial injections into the right coronary artery  shows a mild lesion in the ostium.  There is also a moderate lesion in  the middle segment with a gradual narrowing to approximately 50-60% in  the middle segment before the right ventricular branch.  The remainder  of the right coronary artery appears normal and has normal antegrade  flow with normal distal runoff.  The area of prior angioplasty on her  distal right coronary artery now appears normal.  Further cines showed  more stenosis in the ostium which was presumably due spasm and follow up  cines with a 4-French catheter and after intracoronary nitroglycerin  showed relief of the spasm and a residual stenosis of approximately 30%.  There was  very good antegrade flow and the abnormal areas had good  dilation secondary to the nitroglycerin.  This had an effect making the  mid-right coronary artery lesion more prominent and with the  postangioplasty appearance appeared to be in the 70% stenotic range.   LEFT VENTRICULAR CINE ANGIOGRAM:  The left ventricular chamber size and  contractility appeared normal.  The left ventricular wall thickness was  mild to moderately prominent.  The ejection fraction was estimated at  approximately 70%.  The mitral and aortic valves appeared normal.   FINAL DIAGNOSES:  1. Single-vessel coronary artery disease with moderate to severe      stenosis in the mid-right coronary artery.  2. Mild stenosis ostium right coronary artery.  3. Normal left coronary artery.  4. Evidence for spasm catheter-induced in the ostium of the right      coronary artery relieved with intracoronary nitroglycerin.  5. Evidence for concentric left ventricular hypertrophy with left      ventricular wall thickness prominent.  6. Normal left ventricular function, ejection fraction 70%.  7. Successful AngioSeal of the right femoral artery.   DISPOSITION:  When compared to the prior study, this is a new lesion in  the mid-right coronary artery and we reviewed it Dr. Jenne Campus who  recommended that a Persantine-Myoview study should be done tomorrow  prior to anticipating any intervention.  If the Myoview study is  abnormal showing ischemia in the right coronary artery distribution, he  will schedule her for stent placement on Monday.  Also when compared to  the prior study, there has been an improvement in the left ventricular  wall thickness which now appears only mild to moderate and also the left  ventricular ejection fraction is not as vigorous.  We will return to  6500 for further monitoring and to schedule the Persantine-Myoview for  tomorrow.      Antionette Char, MD  Electronically Signed    JRT/MEDQ  D:   08/08/2006  T:  08/09/2006  Job:  [id number]   cc:   Jenne Campus, M.D.  Cardiac Cath Lab at [facility]

## 2010-11-21 ENCOUNTER — Other Ambulatory Visit: Payer: Self-pay | Admitting: Gastroenterology

## 2010-11-21 ENCOUNTER — Ambulatory Visit (HOSPITAL_COMMUNITY)
Admission: RE | Admit: 2010-11-21 | Discharge: 2010-11-21 | Disposition: A | Discharge status: Home / Self Care | Payer: Medicare Other | Source: Ambulatory Visit | Attending: Gastroenterology | Admitting: Gastroenterology

## 2010-11-21 DIAGNOSIS — K5520 Angiodysplasia of colon without hemorrhage: Secondary | ICD-10-CM | POA: Insufficient documentation

## 2010-11-21 DIAGNOSIS — K5730 Diverticulosis of large intestine without perforation or abscess without bleeding: Secondary | ICD-10-CM | POA: Insufficient documentation

## 2010-11-21 DIAGNOSIS — R195 Other fecal abnormalities: Secondary | ICD-10-CM | POA: Insufficient documentation

## 2010-11-21 DIAGNOSIS — D649 Anemia, unspecified: Secondary | ICD-10-CM | POA: Insufficient documentation

## 2010-11-21 DIAGNOSIS — K449 Diaphragmatic hernia without obstruction or gangrene: Secondary | ICD-10-CM | POA: Insufficient documentation

## 2010-11-21 DIAGNOSIS — K648 Other hemorrhoids: Secondary | ICD-10-CM | POA: Insufficient documentation

## 2010-11-29 NOTE — Op Note (Signed)
  Rhonda [name], Rhonda [name]               ACCOUNT NO.:  [id number]  MEDICAL RECORD NO.:  [id number]           PATIENT TYPE:  O  LOCATION:  WLEN                         FACILITY:  [facility]  PHYSICIAN:  Shirley Friar, MDDATE OF BIRTH:  04/29/1930  DATE OF PROCEDURE: DATE OF DISCHARGE:                              OPERATIVE REPORT   PROCEDURE:  Colonoscopy.  INDICATIONS:  Heme-positive stool, anemia.  MEDICATIONS:  Fentanyl 125 mcg IV, Versed 5 mg IV.  FINDINGS:  Rectal exam was unremarkable.  A pediatric colonoscope was inserted into a tortuous colon and advanced to the cecum where ileocecal valve and appendiceal orifice were identified.  In order to reach the cecum, repeated loop reduction and abdominal pressure was necessary. The terminal ileum was intubated, was normal in appearance.  On careful withdrawal of the colonoscope, there was scattered left-sided diverticula seen.  There was evidence of nonbleeding colonic AVMs in the right colon that were noted.  Retroflexion in the rectum revealed small internal hemorrhoids.  SPECIMEN: 1. Nonbleeding colonic AVMs noted in right colon. 2. Left-sided diverticulosis. 3. Internal hemorrhoids.  PLAN: 1. Proceed with upper endoscopy. 2. No repeat colonoscopy in 10 years due to age.     Shirley Friar, MD     VCS/MEDQ  D:  11/21/2010  T:  11/21/2010  Job:  [id number]  cc:   Soyla Murphy. Renne Crigler, M.D. Fax: [phone]  Electronically Signed by Charlott Rakes MD on 11/29/2010 11:50:11 PM

## 2010-11-29 NOTE — Op Note (Signed)
  NAMEJASMARIE, Rhonda [name]               ACCOUNT NO.:  [id number]  MEDICAL RECORD NO.:  [id number]           PATIENT TYPE:  O  LOCATION:  WLEN                         FACILITY:  [facility]  PHYSICIAN:  Shirley Friar, MDDATE OF BIRTH:  Sep 30, 1929  DATE OF PROCEDURE: DATE OF DISCHARGE:                              OPERATIVE REPORT   PROCEDURE:  Upper endoscopy.  INDICATIONS:  Heme-positive, anemia.  MEDICATIONS:  Cetacaine spray x2, additional medicine given for preceding colonoscopy.  FINDINGS:  Endoscope was inserted through oropharynx, esophagus intubated which was normal in its entirety.  The Z-line was regular and noted to be approximately 38 cm from the incisors.  Endoscope was advanced down to stomach which revealed normal-appearing gastric mucosa. Retroflexion was done which revealed a small hiatal hernia.  Endoscope was straightened and advanced into the duodenal, bulb and second portion of duodenum which were unremarkable.  Biopsies were taken of the duodenum for histologic purposes to evaluate for celiac disease. Endoscope was withdrawn to confirm the above findings.  ASSESSMENT: 1. Small hiatal hernia. 2. Status post duodenal biopsies to check for celiac disease.  PLAN:  Follow up on path.     Shirley Friar, MD     VCS/MEDQ  D:  11/21/2010  T:  11/21/2010  Job:  [id number]  cc:   Soyla Murphy. Renne Crigler, M.D. Fax: [phone]  Electronically Signed by Charlott Rakes MD on 11/29/2010 11:50:18 PM

## 2011-01-25 ENCOUNTER — Other Ambulatory Visit: Payer: Self-pay | Admitting: Gastroenterology

## 2011-01-25 DIAGNOSIS — K639 Disease of intestine, unspecified: Secondary | ICD-10-CM

## 2011-01-30 ENCOUNTER — Other Ambulatory Visit: Payer: Self-pay | Admitting: Gastroenterology

## 2011-01-30 ENCOUNTER — Ambulatory Visit
Admission: RE | Admit: 2011-01-30 | Discharge: 2011-01-30 | Disposition: A | Discharge status: Home / Self Care | Payer: Medicare Other | Source: Ambulatory Visit | Attending: Gastroenterology | Admitting: Gastroenterology

## 2011-01-30 DIAGNOSIS — IMO0002 Reserved for concepts with insufficient information to code with codable children: Secondary | ICD-10-CM

## 2011-01-31 ENCOUNTER — Ambulatory Visit
Admission: RE | Admit: 2011-01-31 | Discharge: 2011-01-31 | Disposition: A | Discharge status: Home / Self Care | Payer: Medicare Other | Source: Ambulatory Visit | Attending: Gastroenterology | Admitting: Gastroenterology

## 2011-01-31 ENCOUNTER — Other Ambulatory Visit: Payer: Self-pay | Admitting: Internal Medicine

## 2011-01-31 DIAGNOSIS — R911 Solitary pulmonary nodule: Secondary | ICD-10-CM

## 2011-01-31 DIAGNOSIS — K921 Melena: Secondary | ICD-10-CM

## 2011-01-31 MED ORDER — IOHEXOL 300 MG/ML  SOLN
100.0000 mL | Freq: Once | INTRAMUSCULAR | Status: AC | PRN
  Administered 2011-01-31: 100 mL via INTRAVENOUS

## 2011-02-04 ENCOUNTER — Ambulatory Visit
Admission: RE | Admit: 2011-02-04 | Discharge: 2011-02-04 | Disposition: A | Discharge status: Home / Self Care | Payer: Medicare Other | Source: Ambulatory Visit | Attending: Internal Medicine | Admitting: Internal Medicine

## 2011-02-04 DIAGNOSIS — R911 Solitary pulmonary nodule: Secondary | ICD-10-CM

## 2011-02-05 ENCOUNTER — Other Ambulatory Visit: Payer: Self-pay | Admitting: Gastroenterology

## 2011-02-05 DIAGNOSIS — T148XXA Other injury of unspecified body region, initial encounter: Secondary | ICD-10-CM

## 2011-02-11 ENCOUNTER — Ambulatory Visit
Admission: RE | Admit: 2011-02-11 | Discharge: 2011-02-11 | Disposition: A | Discharge status: Home / Self Care | Payer: Medicare Other | Source: Ambulatory Visit | Attending: Gastroenterology | Admitting: Gastroenterology

## 2011-02-11 DIAGNOSIS — T148XXA Other injury of unspecified body region, initial encounter: Secondary | ICD-10-CM

## 2011-06-13 ENCOUNTER — Encounter (HOSPITAL_COMMUNITY): Payer: Self-pay

## 2011-06-13 ENCOUNTER — Other Ambulatory Visit: Payer: Self-pay | Admitting: Cardiology

## 2011-06-13 MED ORDER — SODIUM CHLORIDE 0.9 % IJ SOLN: 3.0000 mL | INTRAMUSCULAR | Status: DC | PRN

## 2011-06-13 NOTE — H&P (Signed)
[facility]       CONSULTATION NOTE   Reason for Admission: Symptomatic atrial fibrillation with RVR  HPI: This is a 77 y.o. female with a past medical history significant for PAF and bradycardia with beta blockers. Currently on Tikosyn for rhythm control, but not on AV node blocking agents. Symptoms present when she awoke today (as is her usual pattern). Denies presyncopal symptoms, chest pain or dyspnea, but feels generally unwell and weak.  PMHx:  PAF - last cardioversion 09/13/2010 CAD - remote PTCA to RCA, 50% mid RCA lesion by cath 2010 HTN Carotid bruits without obstructive lesions by [place] Hyperlipidemia Glaucoma GERD No past surgical history on file.  FAMHx:Numerous family members with heart disease  SOCHx:  does not have a smoking history on file. She does not have any smokeless tobacco history on file. Her alcohol and drug histories not on file.Married. Exercises regularly at [facility]  ALLERGIES: Allergies  Allergen Reactions  . Sulfa Drugs Cross Reactors Hives  Bradycardia with beta blockers, severe edema with amlodipine  ROS: Constitutional: positive for weakness Eyes: negative for redness and visual disturbance Ears, nose, mouth, throat, and face: negative for earaches, hearing loss, nasal congestion, snoring, sore throat and tinnitus Respiratory: negative for cough, dyspnea on exertion, hemoptysis, pleurisy/chest pain and wheezing Cardiovascular: negative for chest pain, chest pressure/discomfort, dyspnea, lower extremity edema, near-syncope, orthopnea and paroxysmal nocturnal dyspnea Gastrointestinal: negative for abdominal pain, constipation, diarrhea, dysphagia, melena, nausea and vomiting Genitourinary:negative for dysuria, frequency and urinary incontinence Integument/breast: negative Hematologic/lymphatic: negative for bleeding and easy bruising Musculoskeletal:negative for bone pain, muscle weakness, myalgias and stiff  joints Neurological: negative for dizziness, gait problems, memory problems, speech problems and vertigo Behavioral/Psych: negative for anxiety, bad mood and excessive alcohol consumption Endocrine: negative for diabetic symptoms including polydipsia, polyphagia, polyuria and weight loss  HOME MEDICATIONS: No prescriptions prior to admission  Tikosyn 500 mcg bid Azor 5/40 qd Lipitor 40 mg qd Pantoprazole 40 mg qd Hydralazine 50 mg bid Warfarin 5mg  qd except 2.5 mg q Friday Pataday 1 drop each eye daily ALPHAGAN 1 DOP RIGHT EYE TID DORZOLAMIDE 1 DOP RIGHT EYE BID iNTEGRA ONE DAILY mvi ONE DAILY CALCIUM SUPPL. TID  HOSPITAL MEDICATIONS: I have reviewed the patient's current medications.  VITALS: There were no vitals taken for this visit.  PHYSICAL EXAM: General appearance: alert and no distress Neck: no adenopathy, no carotid bruit, no JVD, supple, symmetrical, trachea midline and thyroid not enlarged, symmetric, no tenderness/mass/nodules Lungs: clear to auscultation bilaterally Heart: regular rate and rhythm, S1, S2 normal and systolic murmur: early systolic 1/6, crescendo and decrescendo at 2nd right intercostal space Abdomen: soft, non-tender; bowel sounds normal; no masses,  no organomegaly Extremities: extremities normal, atraumatic, no cyanosis or edema Pulses: 2+ and symmetric Skin: Skin color, texture, turgor normal. No rashes or lesions Neurologic: Alert and oriented X 3, normal strength and tone. Normal symmetric reflexes. Normal coordination and gait  LABS: No results found for this or any previous visit (from the past 48 hour(s)).  IMAGING: No results found.  IMPRESSION: 1. Symptomatic PAF with RVR. Schedule for DC sync cardioversion tomorrow.Unable to get anesthesiology assistance either today or tomorrow due to busy schedule. Plan to do in cath lab. Risks and benefits discussed and she agrees to proceed. She has been therapeutically anticoagulated consistently  since July.  RECOMMENDATION: 1. Synchronized DCCV. High likelihood she will require dual chamber permanent pacemaker for bradycardia due to necessary meds. Samples of Bystolic 10  mg daily (to start now) given for rate control.  Time Spent Directly with Patient: 45 minutes  Thurmon Fair, MD, [facility] [phone]  06/13/2011, 5:35 PM

## 2011-06-14 ENCOUNTER — Ambulatory Visit (HOSPITAL_COMMUNITY)
Admission: RE | Admit: 2011-06-14 | Discharge: 2011-06-14 | Disposition: A | Discharge status: Home / Self Care | Payer: Medicare Other | Source: Ambulatory Visit | Attending: Cardiovascular Disease | Admitting: Cardiovascular Disease

## 2011-06-14 ENCOUNTER — Encounter (HOSPITAL_COMMUNITY)
Admission: RE | Disposition: A | Discharge status: Home / Self Care | Payer: Self-pay | Source: Ambulatory Visit | Attending: Cardiovascular Disease

## 2011-06-14 ENCOUNTER — Other Ambulatory Visit: Payer: Self-pay

## 2011-06-14 DIAGNOSIS — R55 Syncope and collapse: Secondary | ICD-10-CM | POA: Insufficient documentation

## 2011-06-14 DIAGNOSIS — I4891 Unspecified atrial fibrillation: Secondary | ICD-10-CM | POA: Insufficient documentation

## 2011-06-14 DIAGNOSIS — Z5309 Procedure and treatment not carried out because of other contraindication: Secondary | ICD-10-CM | POA: Insufficient documentation

## 2011-06-14 SURGERY — CARDIOVERSION
Anesthesia: LOCAL

## 2011-06-14 MED ORDER — NEBIVOLOL HCL 10 MG PO TABS: 10.0000 mg | ORAL_TABLET | Freq: Every day | ORAL | Status: DC | PRN

## 2011-06-14 NOTE — Discharge Summary (Signed)
The patient presented for synchronized cardioversion, but was found to be in sinus bradycardia, 52 bpm, preexisting LBBB. Conversion occurred while in bed at 7 AM today and was associated with a brief episode of presyncope (suspect post arrhythmic sinus arrest).  Cardioversion deferred.  I recommend she undergo implantation of a dual chamber permanent pacemaker due to underlying sinus node dysfunction and AV node conduction disease, bradycardia due to necessary meds, tachy-brady sd.  Cannot tolerate beta blockers without PPM. Can take Bystolic prn AFib with RVR.  Will follow-up with Dr. Clarene Duke in a couple of weeks.

## 2011-09-23 ENCOUNTER — Encounter: Payer: Self-pay | Admitting: *Deleted

## 2011-09-23 ENCOUNTER — Encounter: Payer: Self-pay | Admitting: Internal Medicine

## 2011-09-23 ENCOUNTER — Ambulatory Visit (INDEPENDENT_AMBULATORY_CARE_PROVIDER_SITE_OTHER): Payer: Medicare Other | Admitting: Internal Medicine

## 2011-09-23 VITALS — BP 183/79 | HR 69 | Resp 18 | Ht 66.0 in | Wt 126.8 lb

## 2011-09-23 DIAGNOSIS — I4891 Unspecified atrial fibrillation: Secondary | ICD-10-CM

## 2011-09-23 DIAGNOSIS — I2510 Atherosclerotic heart disease of native coronary artery without angina pectoris: Secondary | ICD-10-CM

## 2011-09-23 DIAGNOSIS — I10 Essential (primary) hypertension: Secondary | ICD-10-CM

## 2011-09-23 DIAGNOSIS — I480 Paroxysmal atrial fibrillation: Secondary | ICD-10-CM

## 2011-09-23 DIAGNOSIS — R0989 Other specified symptoms and signs involving the circulatory and respiratory systems: Secondary | ICD-10-CM | POA: Insufficient documentation

## 2011-09-23 NOTE — Assessment & Plan Note (Signed)
Stable No change required today  

## 2011-09-23 NOTE — Assessment & Plan Note (Signed)
The patient has symptomatic paroxysmal atrial fibrillation refractory to medical therapy with tikosyn.  She is appropriately anticoagulated with coumadin. Therapeutic strategies for afib including medicine and ablation were discussed in detail with the patient today. Risk, benefits, and alternatives to EP study and radiofrequency ablation for afib were also discussed in detail today. These risks include but are not limited to stroke, bleeding, vascular damage, tamponade, perforation, damage to the esophagus, lungs, and other structures, pulmonary vein stenosis, worsening renal function, and death. She also recognizes that with LBBB, she has intrinsic conduction disease which may also increase her risks for AV block requiring PPM. The patient understands these risk and wishes to proceed.  We will therefore proceed with catheter ablation at the next available time. She has not had a recent echocardiogram.  I will obtain an echo to evaluate her LA size and to exclude valvular disease at this time. She will require a TEE in addition prior to her ablation procedure which I will arrange with Dr Rubie Maid.

## 2011-09-23 NOTE — Progress Notes (Signed)
Primary Care Physician: Londell Moh, MD, MD Referring Physician:  Dr Caprice Kluver   Rhonda [name] is a 78 y.o. female with a h/o paroxysmal atrial fibrillation who presents today for EP consultation regarding her atrial fibrillation.  She reports initially being diagnosed with atrial fibrillation 10 years ago after waking with "heart racing".  She was placed on atenolol.  She developed increasing frequency and duration of atrial fibrillation and was evaluated by Dr Graciela Husbands who placed her on tikosyn 3/12.  She required cardioversion at that time, though afib had been < 48 hours in duration.  She has had several episodes of atrial fibrillation since starting tikosyn.  She has not required further cardioversion.  Episodes "always occur at night".  Episodes typically last < 12 hours with associated symptoms of palpitations and fatigue. Today, she denies symptoms of chest pain, shortness of breath, orthopnea, PND, lower extremity edema, dizziness, presyncope, syncope, or neurologic sequela. The patient is tolerating medications without difficulties and is otherwise without complaint today.   Past Medical History  Diagnosis Date  . PAF (paroxysmal atrial fibrillation)   . Coronary disease     s/p PTCA 1990s  . Hypertension   . Glaucoma   . LBBB (left bundle branch block)   . GERD (gastroesophageal reflux disease)   . Anemia   . GI bleed     previously with pradaxa  . Hyperlipidemia    Past Surgical History  Procedure Date  . Coronary angioplasty 1990s    by Dr Aleen Campi    Current Outpatient Prescriptions  Medication Sig Dispense Refill  . amLODipine-olmesartan (AZOR) 5-40 MG per tablet Take 1 tablet by mouth daily.        Marland Kitchen atorvastatin (LIPITOR) 40 MG tablet Take 40 mg by mouth daily.        . brimonidine (ALPHAGAN P) 0.15 % ophthalmic solution Place 1 drop into the right eye 3 (three) times daily.        . Calcium Carbonate-Vitamin D (CALCIUM + D PO) Take 1 tablet by mouth 3  (three) times daily.      Marland Kitchen dofetilide (TIKOSYN) 500 MCG capsule Take 500 mcg by mouth 2 (two) times daily.      . dorzolamide (TRUSOPT) 2 % ophthalmic solution Place 1 drop into the right eye 2 (two) times daily.        . Fe Fum-FePoly-Vit C-Vit B3 (INTEGRA PO) Take 1 tablet by mouth at bedtime.        . hydrALAZINE (APRESOLINE) 50 MG tablet Take 50 mg by mouth 2 (two) times daily.        . Multiple Vitamin (MULITIVITAMIN WITH MINERALS) TABS Take 1 tablet by mouth daily.      . nebivolol (BYSTOLIC) 10 MG tablet Take 1 tablet (10 mg total) by mouth daily as needed (for rapid heart rate).  1 tablet  0  . Olopatadine HCl (PATADAY) 0.2 % SOLN Place 1 drop into both eyes daily.        . pantoprazole (PROTONIX) 40 MG tablet Take 40 mg by mouth daily.        Marland Kitchen warfarin (COUMADIN) 5 MG tablet Take 2.5-5 mg by mouth daily. On fridays only pt takes 2.5 mg ( 1/2 tablet) all other days pt takes 5 mg        No current facility-administered medications for this visit.   Facility-Administered Medications Ordered in Other Visits  Medication Dose Route Frequency Provider Last Rate Last Dose  . sodium chloride 0.9 %  injection 3 mL  3 mL Intravenous PRN         Allergies  Allergen Reactions  . Beta Adrenergic Blockers     Bradycardia.  . Norvasc (Amlodipine Besylate)     edema  . Sulfa Drugs Cross Reactors Hives    History   Social History  . Marital Status: Married    Spouse Name: N/A    Number of Children: N/A  . Years of Education: N/A   Occupational History  . Not on file.   Social History Main Topics  . Smoking status: Never Smoker   . Smokeless tobacco: Not on file  . Alcohol Use: No  . Drug Use: No  . Sexually Active: Not on file   Other Topics Concern  . Not on file   Social History Narrative   Pt lives in [place].  Retired form [employer].Attends [employer]    Family History  Problem Relation Age of Onset  . Hypertension      ROS- All systems are reviewed and  negative except as per the HPI above  Physical Exam: Filed Vitals:   09/23/11 1532  BP: 183/79  Pulse: 69  Resp: 18  Height: 5\' 6"  (1.676 m)  Weight: 126 lb 12.8 oz (57.516 kg)    GEN- The patient is thin appearing, alert and oriented x 3 today.   Head- normocephalic, atraumatic Eyes-  Sclera clear, conjunctiva pink Ears- hearing intact Oropharynx- clear Neck- supple, no JVP, + R carotid bruit Lymph- no cervical lymphadenopathy Lungs- Clear to ausculation bilaterally, normal work of breathing Heart- Regular rate and rhythm, no murmurs, rubs or gallops, PMI not laterally displaced GI- soft, NT, ND, + BS Extremities- no clubbing, cyanosis, or edema MS- no significant deformity or atrophy Skin- no rash or lesion Psych- euthymic mood, full affect Neuro- strength and sensation are intact  EKG today reveals sinus rhythm with LBBB  Assessment and Plan:

## 2011-09-23 NOTE — Patient Instructions (Signed)
Your physician has recommended that you have an ablation. Catheter ablation is a medical procedure used to treat some cardiac arrhythmias (irregular heartbeats). During catheter ablation, a long, thin, flexible tube is put into a blood vessel in your groin (upper thigh), or neck. This tube is called an ablation catheter. It is then guided to your heart through the blood vessel. Radio frequency waves destroy small areas of heart tissue where abnormal heartbeats may cause an arrhythmia to start. Please see the instruction sheet given to you today.   Your physician has requested that you have an echocardiogram. Echocardiography is a painless test that uses sound waves to create images of your heart. It provides your doctor with information about the size and shape of your heart and how well your heart's chambers and valves are working. This procedure takes approximately one hour. There are no restrictions for this procedure.  Your physician has requested that you have a carotid duplex. This test is an ultrasound of the carotid arteries in your neck. It looks at blood flow through these arteries that supply the brain with blood. Allow one hour for this exam. There are no restrictions or special instructions.---Dr Fredirick Maudlin office to do

## 2011-09-23 NOTE — Assessment & Plan Note (Addendum)
Elevated today Optimal blood pressure control is necessary for succesful treatment of her afib. She may benefit from addition of spironolactone.  I will defer this decision to Dr Clarene Duke.

## 2011-09-23 NOTE — Assessment & Plan Note (Addendum)
She has a R carotid bruit. I would like to have carotid dopplers performed prior to her ablation to exclude high risk stenosis. I will ask Dr Fredirick Maudlin office to perform this.

## 2011-10-09 ENCOUNTER — Telehealth: Payer: Self-pay | Admitting: Internal Medicine

## 2011-10-09 ENCOUNTER — Other Ambulatory Visit: Payer: Self-pay

## 2011-10-09 ENCOUNTER — Ambulatory Visit (HOSPITAL_COMMUNITY): Payer: Medicare Other | Attending: Internal Medicine

## 2011-10-09 DIAGNOSIS — I10 Essential (primary) hypertension: Secondary | ICD-10-CM | POA: Insufficient documentation

## 2011-10-09 DIAGNOSIS — I447 Left bundle-branch block, unspecified: Secondary | ICD-10-CM | POA: Insufficient documentation

## 2011-10-09 DIAGNOSIS — I2510 Atherosclerotic heart disease of native coronary artery without angina pectoris: Secondary | ICD-10-CM | POA: Insufficient documentation

## 2011-10-09 DIAGNOSIS — I517 Cardiomegaly: Secondary | ICD-10-CM | POA: Insufficient documentation

## 2011-10-09 DIAGNOSIS — I059 Rheumatic mitral valve disease, unspecified: Secondary | ICD-10-CM | POA: Insufficient documentation

## 2011-10-09 DIAGNOSIS — I4891 Unspecified atrial fibrillation: Secondary | ICD-10-CM | POA: Insufficient documentation

## 2011-10-09 NOTE — Telephone Encounter (Signed)
Walk In Pt Form " pt has question about ablation she is scheduled for"  Sent to [facility]  10/09/11/KM

## 2011-10-23 ENCOUNTER — Other Ambulatory Visit: Payer: Self-pay | Admitting: Internal Medicine

## 2011-10-28 ENCOUNTER — Encounter: Payer: Self-pay | Admitting: Internal Medicine

## 2011-11-04 ENCOUNTER — Encounter: Payer: Self-pay | Admitting: Internal Medicine

## 2011-11-05 ENCOUNTER — Other Ambulatory Visit (INDEPENDENT_AMBULATORY_CARE_PROVIDER_SITE_OTHER): Payer: Medicare Other

## 2011-11-05 DIAGNOSIS — I4891 Unspecified atrial fibrillation: Secondary | ICD-10-CM

## 2011-11-05 LAB — CBC WITH DIFFERENTIAL/PLATELET
Eosinophils Relative: 1.3 % (ref 0.0–5.0)
HCT: 36.3 % (ref 36.0–46.0)
Hemoglobin: 12.3 g/dL (ref 12.0–15.0)
Lymphs Abs: 1.4 10*3/uL (ref 0.7–4.0)
MCV: 94.4 fl (ref 78.0–100.0)
Monocytes Relative: 9 % (ref 3.0–12.0)
Neutro Abs: 2.2 10*3/uL (ref 1.4–7.7)
WBC: 4 10*3/uL — ABNORMAL LOW (ref 4.5–10.5)

## 2011-11-05 LAB — BASIC METABOLIC PANEL
Chloride: 106 mEq/L (ref 96–112)
GFR: 108.61 mL/min (ref >60.00)
Glucose, Bld: 81 mg/dL (ref 70–99)
Potassium: 3.9 mEq/L (ref 3.5–5.1)
Sodium: 140 mEq/L (ref 135–145)

## 2011-11-11 ENCOUNTER — Ambulatory Visit (HOSPITAL_COMMUNITY)
Admission: RE | Admit: 2011-11-11 | Discharge: 2011-11-11 | Disposition: A | Discharge status: Home / Self Care | Payer: Medicare Other | Source: Ambulatory Visit | Attending: Internal Medicine | Admitting: Internal Medicine

## 2011-11-11 ENCOUNTER — Other Ambulatory Visit: Payer: Self-pay | Admitting: *Deleted

## 2011-11-11 ENCOUNTER — Encounter (HOSPITAL_COMMUNITY): Payer: Self-pay | Admitting: *Deleted

## 2011-11-11 ENCOUNTER — Encounter (HOSPITAL_COMMUNITY)
Admission: RE | Disposition: A | Discharge status: Home / Self Care | Payer: Self-pay | Source: Ambulatory Visit | Attending: Internal Medicine

## 2011-11-11 DIAGNOSIS — I4891 Unspecified atrial fibrillation: Secondary | ICD-10-CM

## 2011-11-11 HISTORY — PX: TEE WITHOUT CARDIOVERSION: SHX5443

## 2011-11-11 SURGERY — ECHOCARDIOGRAM, TRANSESOPHAGEAL
Anesthesia: Moderate Sedation

## 2011-11-11 MED ORDER — MIDAZOLAM HCL 10 MG/2ML IJ SOLN: 10.0000 mg | Freq: Once | INTRAMUSCULAR | Status: DC

## 2011-11-11 MED ORDER — BUTAMBEN-TETRACAINE-BENZOCAINE 2-2-14 % EX AERO
INHALATION_SPRAY | CUTANEOUS | Status: DC | PRN
  Administered 2011-11-11: 2 via TOPICAL

## 2011-11-11 MED ORDER — FENTANYL CITRATE 0.05 MG/ML IJ SOLN
INTRAMUSCULAR | Status: DC | PRN
  Administered 2011-11-11 (×2): 25 ug via INTRAVENOUS

## 2011-11-11 MED ORDER — MIDAZOLAM HCL 10 MG/2ML IJ SOLN
INTRAMUSCULAR | Status: DC | PRN
  Administered 2011-11-11 (×4): 1 mg via INTRAVENOUS

## 2011-11-11 MED ORDER — FENTANYL CITRATE 0.05 MG/ML IJ SOLN
INTRAMUSCULAR | Status: AC
  Filled 2011-11-11: qty 4

## 2011-11-11 MED ORDER — SODIUM CHLORIDE 0.9 % IV SOLN
INTRAVENOUS | Status: DC
  Administered 2011-11-11: 500 mL via INTRAVENOUS

## 2011-11-11 MED ORDER — BENZOCAINE 20 % MT SOLN
1.0000 "application " | OROMUCOSAL | Status: DC | PRN
  Filled 2011-11-11: qty 57

## 2011-11-11 MED ORDER — LIDOCAINE VISCOUS 2 % MT SOLN
OROMUCOSAL | Status: AC
  Filled 2011-11-11: qty 15

## 2011-11-11 MED ORDER — MIDAZOLAM HCL 10 MG/2ML IJ SOLN
INTRAMUSCULAR | Status: AC
  Filled 2011-11-11: qty 4

## 2011-11-11 MED ORDER — FENTANYL CITRATE 0.05 MG/ML IJ SOLN: 250.0000 ug | Freq: Once | INTRAMUSCULAR | Status: DC

## 2011-11-11 MED ORDER — LIDOCAINE VISCOUS 2 % MT SOLN
OROMUCOSAL | Status: DC | PRN
  Administered 2011-11-11: 10 mL via OROMUCOSAL

## 2011-11-11 NOTE — Discharge Instructions (Addendum)
Endoscopy Care After Please read the instructions outlined below and refer to this sheet in the next few weeks. These discharge instructions provide you with general information on caring for yourself after you leave the hospital. Your doctor may also give you specific instructions. While your treatment has been planned according to the most current medical practices available, unavoidable complications occasionally occur. If you have any problems or questions after discharge, please call your doctor. HOME CARE INSTRUCTIONS Activity  You may resume your regular activity but move at a slower pace for the next 24 hours.   Take frequent rest periods for the next 24 hours.   Walking will help expel (get rid of) the air and reduce the bloated feeling in your abdomen.   No driving for 24 hours (because of the anesthesia (medicine) used during the test).   You may shower.   Do not sign any important legal documents or operate any machinery for 24 hours (because of the anesthesia used during the test).  Nutrition  Drink plenty of fluids.   You may resume your normal diet.   Begin with a light meal and progress to your normal diet.   Avoid alcoholic beverages for 24 hours or as instructed by your caregiver.  Medications You may resume your normal medications unless your caregiver tells you otherwise. What you can expect today  You may experience abdominal discomfort such as a feeling of fullness or "gas" pains.   You may experience a sore throat for 2 to 3 days. This is normal. Gargling with salt water may help this.  Follow-up Your doctor will discuss the results of your test with you. SEEK IMMEDIATE MEDICAL CARE IF:  You have excessive nausea (feeling sick to your stomach) and/or vomiting.   You have severe abdominal pain and distention (swelling).   You have trouble swallowing.   You have a temperature over 100 F (37.8 C).   You have rectal bleeding or vomiting of blood.    Document Released: 01/30/2004 Document Revised: 06/06/2011 Document Reviewed: 08/12/2007 Exit[employer] Patient Information 2012 [employer]. 

## 2011-11-11 NOTE — H&P (Signed)
     [facility]          INTERVAL PROCEDURE H&P   History and Physical Interval Note:  11/11/2011 1:03 PM  Rhonda [name] has presented today for their planned procedure. The various methods of treatment have been discussed with the patient and family. After consideration of risks, benefits and other options for treatment, the patient has consented to the procedure.  The patients' outpatient history has been reviewed, patient examined, and no change in status from most recent office note within the past 30 days. I have reviewed the patients' chart and labs and will proceed as planned. Questions were answered to the patient's satisfaction.   Chrystie Nose, MD, [facility] Attending Cardiologist [facility]  Altan Kraai C 11/11/2011, 1:03 PM

## 2011-11-11 NOTE — CV Procedure (Signed)
[facility]  TRANSESOPHAGEAL ECHOCARDIOGRAM (TEE) NOTE   INDICATIONS: a-fib, prior to ablation  PROCEDURE:   Informed consent was obtained prior to the procedure. The risks, benefits and alternatives for the procedure were discussed and the patient comprehended these risks.  Risks include, but are not limited to, cough, sore throat, vomiting, nausea, somnolence, esophageal and stomach trauma or perforation, bleeding, low blood pressure, aspiration, pneumonia, infection, trauma to the teeth and death.    After a procedural time-out, the patient was given 3 mg versed and 50 mcg fentanyl for moderate sedation.  The oropharynx was anesthetized 10 cc of topical 1% viscous lidocaine. 2 sprays of cetacaine were administered. The transesophageal probe was inserted in the esophagus and stomach without difficulty and multiple views were obtained.  The patient was kept under observation until the patient left the procedure room.  The patient left the procedure room in stable condition.   Agitated microbubble saline contrast was administered.  COMPLICATIONS:    There were no immediate complications.  FINDINGS:  1. LEFT VENTRICLE: The left ventricle is normal in structure and function without any thrombus or masses. LVEF 55-60% without focal wall motion abnormalities.  2. RIGHT VENTRICLE:  The right ventricle is normal in structure and function without any thrombus or masses.    3. LEFT ATRIUM:  The left atrium is dilated without any thrombus or masses.  4. LEFT ATRIAL APPENDAGE:  The left atrial appendage is free of any thrombus or masses.  5. ATRIAL SEPTUM:  The atrial septum is free of any thrombus or masses.  There is no evidence for interatrial shunting by color doppler or saline microbubble.  6. RIGHT ATRIUM:  The right atrium is normal in size and function without any thrombus or masses.  7. MITRAL VALVE:  The mitral valve is normal in structure and function  with trace regurgitation.  There were no vegetations or stenosis.  8. AORTIC VALVE:  The aortic valve is normal in structure and function without regurgitation.  There were no vegetations or stenosis   9. TRICUSPID VALVE:  The tricuspid valve is normal in structure and function with trace regurgitation.  There were no vegetations or stenosis.  10. PULMONIC VALVE:  The pulmonic valve is normal in structure and function without regurgitation.  There were no vegetations or stenosis. The pulmonary artery appears mildly dilated.  11. AORTIC ARCH, ASCENDING AND DESCENDING AORTA:  There is a small amount of atherosclerosis of the proximal aortic arch.   IMPRESSION:   1. Essentially normal TEE without evidence for LAA thrombus. 2. Normal LVEF of 55-60%. 3. No intra-atrial shunting.  RECOMMENDATIONS:    1. No evidence for intracardiac thrombus. Dilated LA with an intact atrial septum.  Time Spent Directly with the Patient:  45 minutes   Chrystie Nose, MD, [facility] Attending Cardiologist [facility]  11/11/2011, 3:35 PM

## 2011-11-12 ENCOUNTER — Inpatient Hospital Stay (HOSPITAL_COMMUNITY)
Admission: RE | Admit: 2011-11-12 | Discharge: 2011-11-14 | DRG: 244 | Disposition: A | Discharge status: Home / Self Care | Payer: Medicare Other | Source: Ambulatory Visit | Attending: Internal Medicine | Admitting: Internal Medicine

## 2011-11-12 ENCOUNTER — Encounter (HOSPITAL_COMMUNITY): Payer: Self-pay | Admitting: Anesthesiology

## 2011-11-12 ENCOUNTER — Ambulatory Visit (HOSPITAL_COMMUNITY): Payer: Medicare Other | Admitting: Anesthesiology

## 2011-11-12 ENCOUNTER — Encounter (HOSPITAL_COMMUNITY): Payer: Self-pay | Admitting: *Deleted

## 2011-11-12 ENCOUNTER — Encounter (HOSPITAL_COMMUNITY)
Admission: RE | Disposition: A | Discharge status: Home / Self Care | Payer: Self-pay | Source: Ambulatory Visit | Attending: Internal Medicine

## 2011-11-12 DIAGNOSIS — I442 Atrioventricular block, complete: Secondary | ICD-10-CM | POA: Diagnosis present

## 2011-11-12 DIAGNOSIS — K219 Gastro-esophageal reflux disease without esophagitis: Secondary | ICD-10-CM | POA: Diagnosis present

## 2011-11-12 DIAGNOSIS — I495 Sick sinus syndrome: Secondary | ICD-10-CM | POA: Diagnosis present

## 2011-11-12 DIAGNOSIS — Z9861 Coronary angioplasty status: Secondary | ICD-10-CM

## 2011-11-12 DIAGNOSIS — E876 Hypokalemia: Secondary | ICD-10-CM | POA: Diagnosis present

## 2011-11-12 DIAGNOSIS — I2510 Atherosclerotic heart disease of native coronary artery without angina pectoris: Secondary | ICD-10-CM | POA: Diagnosis present

## 2011-11-12 DIAGNOSIS — E785 Hyperlipidemia, unspecified: Secondary | ICD-10-CM | POA: Diagnosis present

## 2011-11-12 DIAGNOSIS — I10 Essential (primary) hypertension: Secondary | ICD-10-CM | POA: Diagnosis present

## 2011-11-12 DIAGNOSIS — I459 Conduction disorder, unspecified: Secondary | ICD-10-CM

## 2011-11-12 DIAGNOSIS — I4891 Unspecified atrial fibrillation: Secondary | ICD-10-CM

## 2011-11-12 DIAGNOSIS — D649 Anemia, unspecified: Secondary | ICD-10-CM | POA: Diagnosis present

## 2011-11-12 DIAGNOSIS — H409 Unspecified glaucoma: Secondary | ICD-10-CM | POA: Diagnosis present

## 2011-11-12 DIAGNOSIS — I480 Paroxysmal atrial fibrillation: Secondary | ICD-10-CM | POA: Diagnosis present

## 2011-11-12 DIAGNOSIS — I441 Atrioventricular block, second degree: Secondary | ICD-10-CM | POA: Diagnosis not present

## 2011-11-12 HISTORY — PX: TEMPORARY PACEMAKER INSERTION: SHX5471

## 2011-11-12 HISTORY — PX: ATRIAL FIBRILLATION ABLATION: SHX5456

## 2011-11-12 HISTORY — DX: Presence of cardiac pacemaker: Z95.0

## 2011-11-12 HISTORY — PX: OTHER SURGICAL HISTORY: SHX169

## 2011-11-12 LAB — POCT ACTIVATED CLOTTING TIME
Activated Clotting Time: 402 seconds
Activated Clotting Time: 325 seconds

## 2011-11-12 SURGERY — ATRIAL FIBRILLATION ABLATION
Anesthesia: Monitor Anesthesia Care

## 2011-11-12 MED ORDER — HEPARIN SODIUM (PORCINE) 1000 UNIT/ML IJ SOLN
INTRAMUSCULAR | Status: DC | PRN
  Administered 2011-11-12: 2000 [IU] via INTRAVENOUS
  Administered 2011-11-12: 10000 [IU] via INTRAVENOUS

## 2011-11-12 MED ORDER — HYDROCODONE-ACETAMINOPHEN 5-325 MG PO TABS
1.0000 | ORAL_TABLET | ORAL | Status: DC | PRN
  Administered 2011-11-13 (×2): 2 via ORAL
  Filled 2011-11-12 (×2): qty 2

## 2011-11-12 MED ORDER — HEPARIN SODIUM (PORCINE) 1000 UNIT/ML IJ SOLN
INTRAMUSCULAR | Status: AC
  Filled 2011-11-12: qty 1

## 2011-11-12 MED ORDER — SODIUM CHLORIDE 0.9 % IJ SOLN: 3.0000 mL | INTRAMUSCULAR | Status: DC | PRN

## 2011-11-12 MED ORDER — CHLORHEXIDINE GLUCONATE 4 % EX LIQD
60.0000 mL | Freq: Once | CUTANEOUS | Status: AC
  Administered 2011-11-12: 4 via TOPICAL
  Filled 2011-11-12: qty 60

## 2011-11-12 MED ORDER — CEFAZOLIN SODIUM-DEXTROSE 2-3 GM-% IV SOLR
2.0000 g | INTRAVENOUS | Status: DC
  Filled 2011-11-12: qty 50

## 2011-11-12 MED ORDER — SODIUM CHLORIDE 0.9 % IR SOLN
80.0000 mg | Status: DC
  Filled 2011-11-12: qty 2

## 2011-11-12 MED ORDER — SODIUM CHLORIDE 0.9 % IV SOLN: 250.0000 mL | INTRAVENOUS | Status: DC | PRN

## 2011-11-12 MED ORDER — PROPOFOL 10 MG/ML IV EMUL
INTRAVENOUS | Status: DC | PRN
  Administered 2011-11-12: 30 mg via INTRAVENOUS
  Administered 2011-11-12 (×2): 20 mg via INTRAVENOUS

## 2011-11-12 MED ORDER — SODIUM CHLORIDE 0.9 % IV SOLN
INTRAVENOUS | Status: DC
  Administered 2011-11-12: 1000 mL via INTRAVENOUS

## 2011-11-12 MED ORDER — ONDANSETRON HCL 4 MG/2ML IJ SOLN: 4.0000 mg | Freq: Four times a day (QID) | INTRAMUSCULAR | Status: DC | PRN

## 2011-11-12 MED ORDER — BRIMONIDINE TARTRATE 0.2 % OP SOLN
1.0000 [drp] | Freq: Three times a day (TID) | OPHTHALMIC | Status: DC
  Administered 2011-11-12 – 2011-11-14 (×6): 1 [drp] via OPHTHALMIC
  Filled 2011-11-12: qty 5

## 2011-11-12 MED ORDER — PROTAMINE SULFATE 10 MG/ML IV SOLN
INTRAVENOUS | Status: DC | PRN
  Administered 2011-11-12 (×3): 10 mg via INTRAVENOUS

## 2011-11-12 MED ORDER — FENTANYL CITRATE 0.05 MG/ML IJ SOLN
INTRAMUSCULAR | Status: DC | PRN
  Administered 2011-11-12 (×7): 25 ug via INTRAVENOUS

## 2011-11-12 MED ORDER — ACETAMINOPHEN 325 MG PO TABS
650.0000 mg | ORAL_TABLET | ORAL | Status: DC | PRN
  Administered 2011-11-12: 650 mg via ORAL
  Filled 2011-11-12: qty 2

## 2011-11-12 MED ORDER — SODIUM CHLORIDE 0.9 % IV SOLN
INTRAVENOUS | Status: DC | PRN
  Administered 2011-11-12: 06:00:00 via INTRAVENOUS

## 2011-11-12 MED ORDER — ONDANSETRON HCL 4 MG/2ML IJ SOLN: 4.0000 mg | Freq: Once | INTRAMUSCULAR | Status: AC | PRN

## 2011-11-12 MED ORDER — HYDROMORPHONE HCL PF 1 MG/ML IJ SOLN: 0.2500 mg | INTRAMUSCULAR | Status: DC | PRN

## 2011-11-12 MED ORDER — SODIUM CHLORIDE 0.45 % IV SOLN
INTRAVENOUS | Status: DC
  Administered 2011-11-13: 06:00:00 via INTRAVENOUS

## 2011-11-12 MED ORDER — SODIUM CHLORIDE 0.9 % IV SOLN
INTRAVENOUS | Status: DC
  Administered 2011-11-12: 22:00:00 via INTRAVENOUS

## 2011-11-12 MED ORDER — MIDAZOLAM HCL 5 MG/5ML IJ SOLN
INTRAMUSCULAR | Status: DC | PRN
  Administered 2011-11-12 (×2): 0.5 mg via INTRAVENOUS

## 2011-11-12 MED ORDER — PHENYLEPHRINE HCL 10 MG/ML IJ SOLN
INTRAMUSCULAR | Status: DC | PRN
  Administered 2011-11-12 (×2): 40 ug via INTRAVENOUS

## 2011-11-12 MED ORDER — BUPIVACAINE HCL (PF) 0.25 % IJ SOLN
INTRAMUSCULAR | Status: AC
  Filled 2011-11-12: qty 30

## 2011-11-12 MED ORDER — PANTOPRAZOLE SODIUM 40 MG PO TBEC
40.0000 mg | DELAYED_RELEASE_TABLET | Freq: Every day | ORAL | Status: DC
  Administered 2011-11-12 – 2011-11-14 (×3): 40 mg via ORAL
  Filled 2011-11-12 (×3): qty 1

## 2011-11-12 MED ORDER — PROPOFOL 10 MG/ML IV EMUL
INTRAVENOUS | Status: DC | PRN
  Administered 2011-11-12: 25 ug/kg/min via INTRAVENOUS

## 2011-11-12 MED ORDER — LACTATED RINGERS IV SOLN
INTRAVENOUS | Status: DC | PRN
  Administered 2011-11-12 (×2): via INTRAVENOUS

## 2011-11-12 MED ORDER — DORZOLAMIDE HCL 2 % OP SOLN
1.0000 [drp] | Freq: Two times a day (BID) | OPHTHALMIC | Status: DC
  Administered 2011-11-12 – 2011-11-14 (×4): 1 [drp] via OPHTHALMIC
  Filled 2011-11-12: qty 10

## 2011-11-12 MED ORDER — HYDRALAZINE HCL 50 MG PO TABS
50.0000 mg | ORAL_TABLET | Freq: Two times a day (BID) | ORAL | Status: DC
  Administered 2011-11-12 – 2011-11-14 (×4): 50 mg via ORAL
  Filled 2011-11-12 (×5): qty 1

## 2011-11-12 MED ORDER — WARFARIN - PHYSICIAN DOSING INPATIENT: Freq: Every day | Status: DC

## 2011-11-12 MED ORDER — BRIMONIDINE TARTRATE 0.15 % OP SOLN
1.0000 [drp] | Freq: Three times a day (TID) | OPHTHALMIC | Status: DC
  Filled 2011-11-12: qty 5

## 2011-11-12 MED ORDER — CHLORHEXIDINE GLUCONATE 4 % EX LIQD
60.0000 mL | Freq: Once | CUTANEOUS | Status: AC
  Administered 2011-11-13: 4 via TOPICAL
  Filled 2011-11-12: qty 60

## 2011-11-12 MED ORDER — WARFARIN SODIUM 5 MG PO TABS
5.0000 mg | ORAL_TABLET | Freq: Every day | ORAL | Status: DC
  Administered 2011-11-12: 5 mg via ORAL
  Filled 2011-11-12 (×2): qty 1

## 2011-11-12 MED ORDER — SODIUM CHLORIDE 0.9 % IJ SOLN
3.0000 mL | Freq: Two times a day (BID) | INTRAMUSCULAR | Status: DC
Start: 2011-11-12 — End: 2011-11-14
  Administered 2011-11-12 – 2011-11-13 (×2): 3 mL via INTRAVENOUS
  Administered 2011-11-13: 09:00:00 via INTRAVENOUS
  Administered 2011-11-14: 3 mL via INTRAVENOUS

## 2011-11-12 NOTE — H&P (Signed)
Primary Care Physician: Rhonda Moh, MD, MD  Referring Physician: Dr Rhonda [name]   Rhonda [name] is a 78 y.o. female with a h/o paroxysmal atrial fibrillation who presents today for EP consultation regarding her atrial fibrillation. She reports initially being diagnosed with atrial fibrillation 10 years ago after waking with "heart racing". She was placed on atenolol. She developed increasing frequency and duration of atrial fibrillation and was evaluated by Dr Rhonda [name] who placed her on tikosyn 3/12. She required cardioversion at that time, though afib had been < 48 hours in duration. She has had several episodes of atrial fibrillation since starting tikosyn. She has not required further cardioversion. Episodes "always occur at night". Episodes typically last < 12 hours with associated symptoms of palpitations and fatigue.  Today, she denies symptoms of chest pain, shortness of breath, orthopnea, PND, lower extremity edema, dizziness, presyncope, syncope, or neurologic sequela. The patient is tolerating medications without difficulties and is otherwise without complaint today.   Past Medical History   Diagnosis  Date   .  PAF (paroxysmal atrial fibrillation)    .  Coronary disease      s/p PTCA 1990s   .  Hypertension    .  Glaucoma    .  LBBB (left bundle branch block)    .  GERD (gastroesophageal reflux disease)    .  Anemia    .  GI bleed      previously with pradaxa   .  Hyperlipidemia     Past Surgical History   Procedure  Date   .  Coronary angioplasty  1990s     by Dr Rhonda [name]    Current Outpatient Prescriptions   Medication  Sig  Dispense  Refill   .  amLODipine-olmesartan (AZOR) 5-40 MG per tablet  Take 1 tablet by mouth daily.     Marland Kitchen  atorvastatin (LIPITOR) 40 MG tablet  Take 40 mg by mouth daily.     .  brimonidine (ALPHAGAN P) 0.15 % ophthalmic solution  Place 1 drop into the right eye 3 (three) times daily.     .  Calcium Carbonate-Vitamin D (CALCIUM + D PO)  Take  1 tablet by mouth 3 (three) times daily.     Marland Kitchen  dofetilide (TIKOSYN) 500 MCG capsule  Take 500 mcg by mouth 2 (two) times daily.     .  dorzolamide (TRUSOPT) 2 % ophthalmic solution  Place 1 drop into the right eye 2 (two) times daily.     .  Fe Fum-FePoly-Vit C-Vit B3 (INTEGRA PO)  Take 1 tablet by mouth at bedtime.     .  hydrALAZINE (APRESOLINE) 50 MG tablet  Take 50 mg by mouth 2 (two) times daily.     .  Multiple Vitamin (MULITIVITAMIN WITH MINERALS) TABS  Take 1 tablet by mouth daily.     .  nebivolol (BYSTOLIC) 10 MG tablet  Take 1 tablet (10 mg total) by mouth daily as needed (for rapid heart rate).  1 tablet  0   .  Olopatadine HCl (PATADAY) 0.2 % SOLN  Place 1 drop into both eyes daily.     .  pantoprazole (PROTONIX) 40 MG tablet  Take 40 mg by mouth daily.     Marland Kitchen  warfarin (COUMADIN) 5 MG tablet  Take 2.5-5 mg by mouth daily. On fridays only pt takes 2.5 mg ( 1/2 tablet) all other days pt takes 5 mg      No current facility-administered medications for this  visit.    Facility-Administered Medications Ordered in Other Visits   Medication  Dose  Route  Frequency  Provider  Last Rate  Last Dose   .  sodium chloride 0.9 % injection 3 mL  3 mL  Intravenous  PRN       Allergies   Allergen  Reactions   .  Beta Adrenergic Blockers      Bradycardia.   .  Norvasc (Amlodipine Besylate)      edema   .  Sulfa Drugs Cross Reactors  Hives    History    Social History   .  Marital Status:  Married     Spouse Name:  N/A     Number of Children:  N/A   .  Years of Education:  N/A    Occupational History   .  Not on file.    Social History Main Topics   .  Smoking status:  Never Smoker   .  Smokeless tobacco:  Not on file   .  Alcohol Use:  No   .  Drug Use:  No   .  Sexually Active:  Not on file    Other Topics  Concern   .  Not on file    Social History Narrative    Pt lives in [place]. Retired form [employer].Attends [employer]    Family History   Problem   Relation  Age of Onset   .  Hypertension      ROS- All systems are reviewed and negative except as per the HPI above  Physical Exam:  Filed Vitals:    09/23/11 1532   BP:  183/79   Pulse:  69   Resp:  18   Height:  5\' 6"  (1.676 m)   Weight:  126 lb 12.8 oz (57.516 kg)    GEN- The patient is thin appearing, alert and oriented x 3 today.  Head- normocephalic, atraumatic  Eyes- Sclera clear, conjunctiva pink  Ears- hearing intact  Oropharynx- clear  Neck- supple, no JVP, + R carotid bruit  Lymph- no cervical lymphadenopathy  Lungs- Clear to ausculation bilaterally, normal work of breathing  Heart- Regular rate and rhythm, no murmurs, rubs or gallops, PMI not laterally displaced  GI- soft, NT, ND, + BS  Extremities- no clubbing, cyanosis, or edema  MS- no significant deformity or atrophy  Skin- no rash or lesion  Psych- euthymic mood, full affect  Neuro- strength and sensation are intact  EKG today reveals sinus rhythm with LBBB  Assessment and Plan:   Paroxysmal atrial fibrillation - Rhonda Range, MD   The patient has symptomatic paroxysmal atrial fibrillation refractory to medical therapy with tikosyn. She is appropriately anticoagulated with coumadin.  Therapeutic strategies for afib including medicine and ablation were discussed in detail with the patient today. Risk, benefits, and alternatives to EP study and radiofrequency ablation for afib were also discussed in detail today. These risks include but are not limited to stroke, bleeding, vascular damage, tamponade, perforation, damage to the esophagus, lungs, and other structures, pulmonary vein stenosis, worsening renal function, and death. She also recognizes that with LBBB, she has intrinsic conduction disease which may also increase her risks for AV block requiring PPM. The patient understands these risk and wishes to proceed. We will therefore proceed with catheter ablation.

## 2011-11-12 NOTE — Transfer of Care (Signed)
Immediate Anesthesia Transfer of Care Note  Patient: Rhonda [name]  Procedure(s) Performed: Procedure(s) (LRB): ATRIAL FIBRILLATION ABLATION (N/A)  Patient Location: PACU and Cath Lab  Anesthesia Type: MAC  Level of Consciousness: awake, alert  and oriented  Airway & Oxygen Therapy: Patient Spontanous Breathing and Patient connected to nasal cannula oxygen  Post-op Assessment: Report given to PACU RN, Post -op Vital signs reviewed and stable and Patient moving all extremities  Post vital signs: Reviewed and stable  Complications: No apparent anesthesia complications

## 2011-11-12 NOTE — Anesthesia Postprocedure Evaluation (Signed)
  Anesthesia Post-op Note  Patient: Rhonda [name]  Procedure(s) Performed: Procedure(s) (LRB): ATRIAL FIBRILLATION ABLATION (N/A) TEMPORARY PACEMAKER INSERTION ()  Patient Location: PACU  Anesthesia Type: MAC  Level of Consciousness: awake, alert  and oriented  Airway and Oxygen Therapy: Patient Spontanous Breathing and Patient connected to nasal cannula oxygen  Post-op Pain: mild  Post-op Assessment: Post-op Vital signs reviewed and Patient's Cardiovascular Status Stable  Post-op Vital Signs: stable  Complications: No apparent anesthesia complications

## 2011-11-12 NOTE — Preoperative (Signed)
Beta Blockers   Reason not to administer Beta Blockers:Not Applicable 

## 2011-11-12 NOTE — Anesthesia Preprocedure Evaluation (Addendum)
Anesthesia Evaluation  Patient identified by MRN, date of birth, ID band Patient awake    Reviewed: Allergy & Precautions, H&P , NPO status , Patient's Chart, lab work & pertinent test results, reviewed documented beta blocker date and time   Airway Mallampati: II TM Distance: >3 FB     Dental  (+) Partial Upper and Teeth Intact   Pulmonary  breath sounds clear to auscultation        Cardiovascular hypertension, Pt. on medications and Pt. on home beta blockers + CAD + dysrhythmias Atrial Fibrillation Rhythm:Regular Rate:Normal     Neuro/Psych    GI/Hepatic GERD-  Medicated and Controlled,  Endo/Other    Renal/GU      Musculoskeletal   Abdominal   Peds  Hematology   Anesthesia Other Findings   Reproductive/Obstetrics                         Anesthesia Physical Anesthesia Plan  ASA: III  Anesthesia Plan: MAC   Post-op Pain Management:    Induction: Intravenous  Airway Management Planned: Simple Face Mask  Additional Equipment: Arterial line  Intra-op Plan:   Post-operative Plan:   Informed Consent: I have reviewed the patients History and Physical, chart, labs and discussed the procedure including the risks, benefits and alternatives for the proposed anesthesia with the patient or authorized representative who has indicated his/her understanding and acceptance.   Dental advisory given  Plan Discussed with: Anesthesiologist and Surgeon  Anesthesia Plan Comments:         Anesthesia Quick Evaluation

## 2011-11-12 NOTE — Op Note (Signed)
SURGEON:  [name], MD  PREPROCEDURE DIAGNOSES: 1. Paroxysmal atrial fibrillation. 2. Left bundle branch block  POSTPROCEDURE DIAGNOSES: 1. Paroxysmal  atrial fibrillation. 2. Left Bundle Branch Block 3. Mobitz II second degree AV block with transient complete heart block observed prior to ablation  PROCEDURES: 1. Comprehensive electrophysiologic study. 2. Coronary sinus pacing and recording. 3. Three-dimensional mapping of atrial fibrillation 4. Ablation of atrial fibrillation 5. Arterial blood pressure monitoring. 6. Intracardiac echocardiography. 7. Transseptal puncture of an intact septum. 8. Rotational Angiography with processing at an independent workstation 9. Temporary Transvenous pacemaker  INTRODUCTION:  Rhonda [name] is a 78 y.o. female with a history of paroxysmal atrial fibrillation and left bundle branch block who now presents for EP study and radiofrequency ablation.  The patient reports initially being diagnosed with atrial fibrillation more than 5 years ago. The patient reports increasing frequency and duration of atrial fibrillation since that time.  The patient has failed medical therapy with  Joice Lofts.  The patient therefore presents today for catheter ablation of atrial fibrillation.  DESCRIPTION OF PROCEDURE:  Informed written consent was obtained, and the patient was brought to the electrophysiology lab in a fasting state.  The patient was adequately sedated with intravenous medications as outlined in the anesthesia report.  The patient's left and right groins were prepped and draped in the usual sterile fashion by the EP lab staff.  Using a percutaneous Seldinger technique, two 7-French and one 8-French hemostasis sheaths were placed into the right common femoral vein.  A 4- [place] hemostasis sheath was placed in the right common femoral artery for blood pressure monitoring.  An 11-French hemostasis sheath was placed into the left common femoral vein.   3  Dimensional Rotational Angiography: A 5 french pigtail catheter was introduced through the right common femoral vein and advanced into the inferior venocava.  3 demential rotational angiography was then performed by power injection of 100cc of nonionic contrast.  Reprocessing at an independent work station was then performed.   This demonstrated a moderate sized left atrium with 4 separate pulmonary veins.  The superior PVs were moderate in size.  The inferior PVs were small in size.  There were no anomalous veins or significant abnormalities.  A 3 dimensional rendering of the left atrium was then merged using [employer] onto the WellPoint system and registered with intracardiac echo (see below).  The pigtail catheter was then removed.  Catheter Placement:  A 7-French Biosense Webster Decapolar coronary sinus catheter was introduced through the right common femoral vein and advanced into the coronary sinus for recording and pacing from this location.  A 6-French quadripolar Josephson catheter was introduced through the right common femoral vein and advanced into the right ventricle for recording and pacing.  This catheter was then pulled back to the His bundle location.    Initial Measurements: The patient presented to the electrophysiology lab in sinus rhythm.  Her PR interval measured with a QRS duringation of 162 (LBBB morphology) and a QT interval of 488 msec.  The AH interval measured 92 and the HV interval measured 55 msec.   The patient was observed to have frequent atrial beats with infrahisian block.  With additional sedation, the patient developed mobitz II second degree AV block and required temporary venous pacing.  With ventricular pacing concentric VA conduction was observed with an AV wenckebach cycle length of .  Intracardiac Echocardiography: A 10-French Biosense Webster AcuNav intracardiac echocardiography catheter was introduced through the left common  femoral vein and advanced into the right atrium. Intracardiac echocardiography was performed of the left atrium, and a three-dimensional anatomical rendering of the left atrium was performed using CARTO sound technology.  The patient was noted to have a moderate sized left atrium.  The interatrial septum was prominent but not aneurysmal. All 4 pulmonary veins were visualized and noted to have separate ostia.  The superior pulmonary veins were moderate in size and the inferior pumonary veins were small.  The left atrial appendage was visualized and did not reveal thrombus.   There was no evidence of pulmonary vein stenosis.   Transseptal Puncture: The middle right common femoral vein sheath was exchanged for an 8.5 [place] SL2 transseptal sheath and transseptal access was achieved in a standard fashion using a Brockenbrough needle under biplane fluoroscopy with intracardiac echocardiography confirmation of the transseptal puncture.  Once transseptal access had been achieved, heparin was administered intravenously and intra- arterially in order to maintain an ACT of greater than 350 seconds throughout the procedure.   3D Mapping and Ablation: The His bundle catheter was removed and in its place a 3.5 mm Biosense Webster EZ [employer] ablation catheter was advanced into the right atrium.  The transseptal sheath was pulled back into the IVC over a guidewire.  The ablation catheter was advanced across the transseptal hole using the wire as a guide.  The transseptal sheath was then re-advanced over the guidewire into the left atrium.  A duodecapolar Biosense Webster circular mapping catheter was introduced through the transseptal sheath and positioned over the mouth of all 4 pulmonary veins.  Three-dimensional electroanatomical mapping was performed using CARTO technology.  This demonstrated electrical activity within all four pulmonary veins at baseline. The patient underwent successful sequential electrical  isolation and anatomical encircling of all four pulmonary veins using radiofrequency current with a circular mapping catheter as a guide.  During catheter manipulation within the left superior pulmonary vein, she converted to afib.  Cardioversion: The patient was then cardioverted to sinus rhythm with a single synchronized 200-J biphasic shock with cardioversion electrodes in the anterior-posterior thoracic configuration. He maintained sinus rhythm but then had complete heart block and required ongoing ventricular pacing via a 5 french balloon tipped pacing electrode. The procedure was therefore considered completed.  All catheters were removed (except for the balloon tipped pacing electrode), and the sheaths were aspirated and flushed.  The patient was transferred to the recovery area for sheath removal per protocol.  A limited bedside transthoracic echocardiogram revealed no pericardial effusion.     CONCLUSIONS: 1. Sinus rhythm upon presentation with mobitz II second degree AV block  2. Rotational Angiography reveals a moderate sized left atrium with four separate pulmonary veins without evidence of pulmonary vein stenosis. 3. Successful electrical isolation and anatomical encircling of all four pulmonary veins with radiofrequency current. 4. Successful cardioversion to sinus rhythm. 5. Progression of mobitz II AV block to complete heart block requiring temporary back up pacing   Henritta Mutz,MD 11:26 AM 11/12/2011

## 2011-11-13 ENCOUNTER — Encounter (HOSPITAL_COMMUNITY)
Admission: RE | Disposition: A | Discharge status: Home / Self Care | Payer: Self-pay | Source: Ambulatory Visit | Attending: Internal Medicine

## 2011-11-13 DIAGNOSIS — I4891 Unspecified atrial fibrillation: Principal | ICD-10-CM

## 2011-11-13 HISTORY — PX: PERMANENT PACEMAKER INSERTION: SHX5480

## 2011-11-13 HISTORY — PX: PACEMAKER INSERTION: SHX728

## 2011-11-13 LAB — BASIC METABOLIC PANEL
BUN: 8 mg/dL (ref 6–23)
Creatinine, Ser: 0.47 mg/dL — ABNORMAL LOW (ref 0.50–1.10)
GFR calc Af Amer: >90 mL/min (ref >90)
GFR calc non Af Amer: >90 mL/min (ref >90)

## 2011-11-13 LAB — PROTIME-INR: Prothrombin Time: 32.6 seconds — ABNORMAL HIGH (ref 11.6–15.2)

## 2011-11-13 LAB — CBC
MCH: 31.6 pg (ref 26.0–34.0)
MCHC: 34.7 g/dL (ref 30.0–36.0)
MCV: 91.1 fL (ref 78.0–100.0)
Platelets: 171 10*3/uL (ref 150–400)
RDW: 12.6 % (ref 11.5–15.5)

## 2011-11-13 SURGERY — PERMANENT PACEMAKER INSERTION
Anesthesia: LOCAL

## 2011-11-13 MED ORDER — FENTANYL CITRATE 0.05 MG/ML IJ SOLN: 25.0000 ug | INTRAMUSCULAR | Status: DC | PRN

## 2011-11-13 MED ORDER — POTASSIUM CHLORIDE CRYS ER 20 MEQ PO TBCR: 40.0000 meq | EXTENDED_RELEASE_TABLET | Freq: Once | ORAL | Status: DC

## 2011-11-13 MED ORDER — ACETAMINOPHEN 325 MG PO TABS: 325.0000 mg | ORAL_TABLET | ORAL | Status: DC | PRN

## 2011-11-13 MED ORDER — POTASSIUM CHLORIDE CRYS ER 20 MEQ PO TBCR
EXTENDED_RELEASE_TABLET | ORAL | Status: AC
  Administered 2011-11-13: 40 meq
  Filled 2011-11-13: qty 2

## 2011-11-13 MED ORDER — ONDANSETRON HCL 4 MG/2ML IJ SOLN: 4.0000 mg | Freq: Four times a day (QID) | INTRAMUSCULAR | Status: DC | PRN

## 2011-11-13 MED ORDER — WARFARIN - PHYSICIAN DOSING INPATIENT: Freq: Every day | Status: DC

## 2011-11-13 MED ORDER — POTASSIUM CHLORIDE CRYS ER 20 MEQ PO TBCR
40.0000 meq | EXTENDED_RELEASE_TABLET | Freq: Once | ORAL | Status: AC
  Administered 2011-11-13: 40 meq via ORAL

## 2011-11-13 MED ORDER — FENTANYL CITRATE 0.05 MG/ML IJ SOLN
INTRAMUSCULAR | Status: AC
  Filled 2011-11-13: qty 2

## 2011-11-13 MED ORDER — LIDOCAINE HCL (PF) 1 % IJ SOLN
INTRAMUSCULAR | Status: AC
  Filled 2011-11-13: qty 60

## 2011-11-13 MED ORDER — CEFAZOLIN SODIUM 1-5 GM-% IV SOLN
1.0000 g | Freq: Four times a day (QID) | INTRAVENOUS | Status: AC
  Administered 2011-11-13 – 2011-11-14 (×3): 1 g via INTRAVENOUS
  Filled 2011-11-13 (×3): qty 50

## 2011-11-13 MED ORDER — MIDAZOLAM HCL 2 MG/2ML IJ SOLN
INTRAMUSCULAR | Status: AC
  Filled 2011-11-13: qty 2

## 2011-11-13 MED ORDER — HEPARIN (PORCINE) IN NACL 2-0.9 UNIT/ML-% IJ SOLN
INTRAMUSCULAR | Status: AC
  Filled 2011-11-13: qty 1000

## 2011-11-13 NOTE — CV Procedure (Signed)
Rhonda [name],Rhonda [name] Female, 78 y.o., 07-21-1929  Location: [facility]CATH [facility]  MRN: [id number]  CSN: [id number]  Procedure report  Procedure performed:  1. Implantation of new dual chamber permanent pacemaker 2. Fluoroscopy 3. Light sedation 4. Left upper extremity venogram  Reason for procedure: Tachycardia-bradycardia syndrome Bradycardia due to necessary medications Second degree atrioventricular block Mobitz type  II Infra-hisian block by electrophysiology study Paroxysmal atrial fibrillation status post recent radiofrequency ablation  Procedure performed by: Thurmon Fair, MD  Complications: None  Estimated blood loss: <10 mL  Medications administered during procedure: Ancef 1 g intravenously Lidocaine 1% 30 mL locally,  Fentanyl 75 mcg intravenously Versed 2 mg intravenously Omnipaque 50 mL IV  Device details:  Generator Medtronic Adapta model DDDR L1 serial number [id number] H Right atrial lead Medtronic 5076-45 cm serial number [id number] Right ventricular lead Medtronic 5076-52 cm serial number [id number]  Procedure details:  After the risks and benefits of the procedure were discussed the patient provided informed consent and was brought to the cardiac cath lab in the fasting state. The patient was prepped and draped in usual sterile fashion. Local anesthesia with 1% lidocaine was administered to to the left infraclavicular area. A 5-6 cm horizontal incision was made parallel with and 2-3 cm caudal to the left clavicle. Using electrocautery and blunt dissection a prepectoral pocket was created down to the level of the pectoralis major muscle fascia. The pocket was carefully inspected for hemostasis. Care was taken to ensure excellent hemostasis as the patient was fully anticoagulated with warfarin with an INR of 3.1An antibiotic-soaked sponge was placed in the pocket.  Under fluoroscopic guidance and using the modified Seldinger technique 2 separate venipunctures were  performed to access the left subclavian vein. considerable difficulty was encountered accessing the vein.  A venogram was performed. Central venous pressure was found to be very low and intravenous fluids are administered throughout the procedure. Two J-tip guidewires were subsequently exchanged for two 7 French safe sheaths.  Under fluoroscopic guidance the ventricular lead was advanced to level of the mid to apical right ventricular septum and thet active-fixation helix was deployed. Prominent current of injury was seen. Satisfactory pacing and sensing parameters were recorded. There was no evidence of diaphragmatic stimulation at maximum device output. The safe sheath was peeled away and the lead was secured in place with 2-0 silk.  In similar fashion the right atrial lead was advanced to the level of the atrial appendage. The active-fixation helix was deployed. There was prominent current of injury. Satisfactory  pacing and sensing parameters were recorded. There was no evidence of diaphragmatic stimulation with pacing at maximum device output. The safe sheath was peeled away and the lead was secured in place with 2-0 silk.  The antibiotic-soaked sponge was removed from the pocket. The pocket was flushed with copious amounts of antibiotic solution. Reinspection showed excellent hemostasis..  The ventricular lead was connected to the generator and appropriate ventricular pacing was seen. Subsequently the atrial lead was also connected. Repeat testing of the lead parameters later showed excellent values.  The entire system was then carefully inserted in the pocket with care been taking that the leads and device assumed a comfortable position without pressure on the incision. Great care was taken that the leads be located deep to the generator. The pocket was then closed in layers using 2 layers of 2-0 Vicryl and cutaneous staples, after which a sterile dressing was applied.  At the end of the procedure  the following lead parameters were encountered:  Right atrial lead  sensed P waves 2.4, impedance 659 ohms, threshold 1.1 V at 0.5 ms pulse width.  Right ventricular lead sensed R waves 10.6 mV, impedance 1020 ohms, threshold 0.7 V at 0.5 ms pulse width.   Cc: Julieanne Manson, MD, [name], MD  Thurmon Fair, MD, [facility] [phone] office [phone] pager 11/13/2011 12:50 PM

## 2011-11-13 NOTE — Progress Notes (Signed)
SUBJECTIVE:  Doing well.  Intrinsic conduction has returned this am.  At this time, she denies chest pain, shortness of breath, or any new concerns.     . brimonidine  1 drop Right Eye TID  .  ceFAZolin (ANCEF) IV  2 g Intravenous On Call  . chlorhexidine  60 mL Topical Once  . chlorhexidine  60 mL Topical Once  . dorzolamide  1 drop Right Eye BID  . gentamicin irrigation  80 mg Irrigation On Call  . hydrALAZINE  50 mg Oral BID  . pantoprazole  40 mg Oral Daily  . potassium chloride  40 mEq Oral Once  . potassium chloride  40 mEq Oral Once  . sodium chloride  3 mL Intravenous Q12H  . Warfarin - Physician Dosing Inpatient   Does not apply q1800  . DISCONTD: brimonidine  1 drop Right Eye TID  . DISCONTD: warfarin  5 mg Oral q1800      . sodium chloride 10 mL/hr at 11/13/11 0540  . sodium chloride 50 mL/hr at 11/13/11 0600  . DISCONTD: sodium chloride 1,000 mL (11/12/11 0634)    OBJECTIVE: Physical Exam: Filed Vitals:   11/13/11 0500 11/13/11 0600 11/13/11 0700 11/13/11 0800  BP: 134/74 150/62 127/67 139/69  Pulse: 59     Temp:      TempSrc:      Resp: 16 22 12 17   Height:      Weight:      SpO2: 95%       Intake/Output Summary (Last 24 hours) at 11/13/11 0827 Last data filed at 11/13/11 0600  Gross per 24 hour  Intake 1683.33 ml  Output   1930 ml  Net -246.67 ml    Telemetry reveals sinus rhythm  GEN- The patient is well appearing, alert and oriented x 3 today.   Head- normocephalic, atraumatic Eyes-  Sclera clear, conjunctiva pink Ears- hearing intact Oropharynx- clear Neck- supple, no JVP Lymph- no cervical lymphadenopathy Lungs- Clear to ausculation bilaterally, normal work of breathing Heart- Regular rate and rhythm, no murmurs, rubs or gallops, PMI not laterally displaced GI- soft, NT, ND, + BS Extremities- no clubbing, cyanosis, or edema R femoral sheath in place  LABS: Basic Metabolic Panel:  Basename 11/13/11 0445  NA 140  K 3.0*  CL 107    CO2 21  GLUCOSE 96  BUN 8  CREATININE 0.47*  CALCIUM 9.2  MG --  PHOS --   Liver Function Tests: No results found for this basename: AST:2,ALT:2,ALKPHOS:2,BILITOT:2,PROT:2,ALBUMIN:2 in the last 72 hours No results found for this basename: LIPASE:2,AMYLASE:2 in the last 72 hours CBC:  Basename 11/13/11 0445  WBC 6.6  NEUTROABS --  HGB 12.5  HCT 36.0  MCV 91.1  PLT 171   RADIOLOGY: No results found.  ASSESSMENT AND PLAN:  Active Problems:  Paroxysmal atrial fibrillation  CAD (coronary artery disease)  Hypertension  1. Afib- doing well s/p ablation Would keep INR 2-3 (hold coumadin tonight) tikosyn on hold with AV block  2. Mobitz II AV block Will likely have PPM by Dr Royann Shivers today  3. Hypokalemia- replete  Transfer to telemetry after PPM implant.     [name], MD 11/13/2011 8:27 AM

## 2011-11-13 NOTE — Progress Notes (Signed)
Pt was transferred to the cath lab on zoll with  RN

## 2011-11-13 NOTE — Progress Notes (Signed)
[facility]  DAILY PROGRESS NOTE   Subjective:  Rhonda [name] is a long-standing patient of Dr. Julieanne Manson who has had recurrent problems of paroxysmal atrial fibrillation and underwent atrial fibrillation ablation yesterday. Even before the ablation procedure was initiated was evidence of repeated episodes of infectious in block and following cardiac pacing she developed complete heart block without a ventricular escape rhythm. The ablation procedure was completed successfully and a temporary pacer was left in place overnight. She has had recurrence of intrinsic AV conduction but has a long-standing evidence of AV node disease by EKG. She needs a dual-chamber permanent pacemaker. She is fully anticoagulated and anticoagulations cannot be stopped due to the recent ablation procedure. Unfortunately Rhonda anticoagulations slightly supratherapeutic today with an INR of 3.1.  Objective:  Temp:  [97.7 F (36.5 C)-98.5 F (36.9 C)] 98.1 F (36.7 C) (05/15 0800) Pulse Rate:  [59-62] 59  (05/15 0500) Resp:  [10-22] 14  (05/15 1000) BP: (115-168)/(51-85) 147/55 mmHg (05/15 1000) SpO2:  [95 %-98 %] 95 % (05/15 0800) Weight:  [56.3 kg (124 lb 1.9 oz)] 56.3 kg (124 lb 1.9 oz) (05/14 1504) Weight change: -0.853 kg (-1 lb 14.1 oz)  Intake/Output from previous day: 05/14 0701 - 05/15 0700 In: 1743.3 [P.O.:120; I.V.:1623.3] Out: 1930 [Urine:1925; Blood:5]  Intake/Output from this shift: Total I/O In: 120 [I.V.:120] Out: 350 [Urine:350]  Medications: Current Facility-Administered Medications  Medication Dose Route Frequency Provider Last Rate Last Dose  . 0.45 % sodium chloride infusion   Intravenous Continuous [name], MD 10 mL/hr at 11/13/11 0700 10 mL/hr at 11/13/11 0700  . 0.9 %  sodium chloride infusion  250 mL Intravenous PRN [name], MD      . 0.9 %  sodium chloride infusion   Intravenous Continuous [name], MD 50 mL/hr at 11/13/11 0700 50 mL/hr at  11/13/11 0700  . acetaminophen (TYLENOL) tablet 650 mg  650 mg Oral Q4H PRN [name], MD   650 mg at 11/12/11 2340  . brimonidine (ALPHAGAN) 0.2 % ophthalmic solution 1 drop  1 drop Right Eye TID [name], MD   1 drop at 11/13/11 0834  . ceFAZolin (ANCEF) IVPB 2 g/50 mL premix  2 g Intravenous On Call [name], MD      . chlorhexidine (HIBICLENS) 4 % liquid 4 application  60 mL Topical Once [name], MD   4 application at 11/12/11 1945  . chlorhexidine (HIBICLENS) 4 % liquid 4 application  60 mL Topical Once [name], MD   4 application at 11/13/11 0540  . dorzolamide (TRUSOPT) 2 % ophthalmic solution 1 drop  1 drop Right Eye BID [name], MD   1 drop at 11/13/11 0834  . gentamicin (GARAMYCIN) 80 mg in sodium chloride irrigation 0.9 % 500 mL irrigation  80 mg Irrigation On Call [name], MD      . hydrALAZINE (APRESOLINE) tablet 50 mg  50 mg Oral BID [name], MD   50 mg at 11/13/11 0900  . HYDROcodone-acetaminophen (NORCO) 5-325 MG per tablet 1-2 tablet  1-2 tablet Oral Q4H PRN [name], MD      . HYDROmorphone (DILAUDID) injection 0.25-0.5 mg  0.25-0.5 mg Intravenous Q5 min PRN Kipp Brood, MD      . ondansetron [facility]) injection 4 mg  4 mg Intravenous Once PRN Kipp Brood, MD      . ondansetron [facility]) injection 4 mg  4 mg Intravenous Q6H PRN [name], MD      .  pantoprazole (PROTONIX) EC tablet 40 mg  40 mg Oral Daily [name], MD   40 mg at 11/13/11 0900  . potassium chloride SA (K-DUR,KLOR-CON) 20 MEQ CR tablet        40 mEq at 11/13/11 0843  . potassium chloride SA (K-DUR,KLOR-CON) CR tablet 40 mEq  40 mEq Oral Once [name], MD   40 mEq at 11/13/11 0829  . potassium chloride SA (K-DUR,KLOR-CON) CR tablet 40 mEq  40 mEq Oral Once [name], MD      . sodium chloride 0.9 % injection 3 mL  3 mL Intravenous Q12H [name], MD      . sodium chloride 0.9 % injection 3 mL  3 mL Intravenous PRN [name], MD      . Warfarin - Physician  Dosing Inpatient   Does not apply [id number] [name], MD      . DISCONTD: 0.9 %  sodium chloride infusion   Intravenous Continuous [name], MD 50 mL/hr at 11/12/11 0634 1,000 mL at 11/12/11 0634  . DISCONTD: brimonidine (ALPHAGAN) 0.15 % ophthalmic solution 1 drop  1 drop Right Eye TID [name], MD      . DISCONTD: warfarin (COUMADIN) tablet 5 mg  5 mg Oral q1800 [name], MD   5 mg at 11/12/11 1700   Facility-Administered Medications Ordered in Other Encounters  Medication Dose Route Frequency Provider Last Rate Last Dose  . sodium chloride 0.9 % injection 3 mL  3 mL Intravenous PRN       . DISCONTD: 0.9 %  sodium chloride infusion    Continuous PRN Randel K Temples, CRNA      . DISCONTD: fentaNYL (SUBLIMAZE) injection    PRN Randel K Temples, CRNA   25 mcg at 11/12/11 1005  . DISCONTD: heparin injection    PRN Randel K Temples, CRNA   2,000 Units at 11/12/11 1040  . DISCONTD: lactated ringers infusion    Continuous PRN Randel K Temples, CRNA      . DISCONTD: midazolam (VERSED) 5 MG/5ML injection    PRN Randel K Temples, CRNA   0.5 mg at 11/12/11 0815  . DISCONTD: phenylephrine (NEO-SYNEPHRINE) injection    PRN Randel K Temples, CRNA   40 mcg at 11/12/11 1112  . DISCONTD: propofol (DIPRIVAN) 10 mg/ml infusion    Continuous PRN Randel K Temples, CRNA   25 mcg/kg/min at 11/12/11 1103  . DISCONTD: propofol (DIPRIVAN) 10 mg/ml infusion    PRN Randel K Temples, CRNA   30 mg at 11/12/11 1054  . DISCONTD: protamine injection    PRN Randel K Temples, CRNA   10 mg at 11/12/11 1134    Physical Exam: General appearance: alert, cooperative and no distress Neck: no adenopathy, no carotid bruit, no JVD, supple, symmetrical, trachea midline and thyroid not enlarged, symmetric, no tenderness/mass/nodules Lungs: clear to auscultation bilaterally Heart: regular rate and rhythm, S1, S2 normal, no murmur, click, rub or gallop Abdomen: soft, non-tender; bowel sounds normal; no masses,  no  organomegaly Extremities: extremities normal, atraumatic, no cyanosis or edema and right femoral venous sheath site appears healthy without bleeding Pulses: 2+ and symmetric Skin: Skin color, texture, turgor normal. No rashes or lesions Neurologic: Alert and oriented X 3, normal strength and tone. Normal symmetric reflexes. Normal coordination and gait  Lab Results: Results for orders placed during the hospital encounter of 11/12/11 (from the past 48 hour(s))  PROTIME-INR     Status: Abnormal   Collection Time   11/12/11  6:16 AM      Component Value Range Comment   Prothrombin Time 27.1 (*) 11.6 - 15.2 (seconds)    INR 2.46 (*) 0.00 - 1.49    POCT ACTIVATED CLOTTING TIME     Status: Normal   Collection Time   11/12/11  9:53 AM      Component Value Range Comment   Activated Clotting Time 402     POCT ACTIVATED CLOTTING TIME     Status: Normal   Collection Time   11/12/11 10:05 AM      Component Value Range Comment   Activated Clotting Time 369     POCT ACTIVATED CLOTTING TIME     Status: Normal   Collection Time   11/12/11 10:32 AM      Component Value Range Comment   Activated Clotting Time 325     POCT ACTIVATED CLOTTING TIME     Status: Normal   Collection Time   11/12/11 11:18 AM      Component Value Range Comment   Activated Clotting Time 325     POCT ACTIVATED CLOTTING TIME     Status: Normal   Collection Time   11/12/11 12:15 PM      Component Value Range Comment   Activated Clotting Time 171     BASIC METABOLIC PANEL     Status: Abnormal   Collection Time   11/13/11  4:45 AM      Component Value Range Comment   Sodium 140  135 - 145 (mEq/L)    Potassium 3.0 (*) 3.5 - 5.1 (mEq/L)    Chloride 107  96 - 112 (mEq/L)    CO2 21  19 - 32 (mEq/L)    Glucose, Bld 96  70 - 99 (mg/dL)    BUN 8  6 - 23 (mg/dL)    Creatinine, Ser 4.09 (*) 0.50 - 1.10 (mg/dL)    Calcium 9.2  8.4 - 10.5 (mg/dL)    GFR calc non Af Amer >90  >90 (mL/min)    GFR calc Af Amer >90  >90 (mL/min)     PROTIME-INR     Status: Abnormal   Collection Time   11/13/11  4:45 AM      Component Value Range Comment   Prothrombin Time 32.6 (*) 11.6 - 15.2 (seconds)    INR 3.12 (*) 0.00 - 1.49    CBC     Status: Normal   Collection Time   11/13/11  4:45 AM      Component Value Range Comment   WBC 6.6  4.0 - 10.5 (K/uL)    RBC 3.95  3.87 - 5.11 (MIL/uL)    Hemoglobin 12.5  12.0 - 15.0 (g/dL)    HCT 81.1  91.4 - 78.2 (%)    MCV 91.1  78.0 - 100.0 (fL)    MCH 31.6  26.0 - 34.0 (pg)    MCHC 34.7  30.0 - 36.0 (g/dL)    RDW 95.6  21.3 - 08.6 (%)    Platelets 171  150 - 400 (K/uL)     Imaging: No results found.  Assessment:  1. Active Problems: 2.  Paroxysmal atrial fibrillation 3.  CAD (coronary artery disease) 4.  Hypertension 5.   Plan:  1. Proceed with dual-chamber permanent pacemaker implantation today after potassium has been repleted. The risks and benefits of dual-chamber permanent pacemaker implantation were discussed in detail with the patient. Specifically reviewed the the increased risk of bleeding because of supratherapeutic anticoagulation levels. This also  increases the risks of complications the case of lead perforation although this is an uncommon occurrence. We also reviewed the typical risks of the dislodgment and need for operation, pneumothorax and infection amongst others.  Consideration was given to delaying the procedure until tomorrow, but it is possible that anticoagulation levels will be even higher tomorrow. With a femoral transvenous temporary pacemaker in place the risk of complications exceeds the risk of bleeding at the surgical site in my opinion. Rhonda [name] and Rhonda [name] understand these considerations and are willing to proceed with pacemaker implantation today  Time Spent Directly with Patient:  30 minutes  Length of Stay:  LOS: 1 day    Destiney Sanabia 11/13/2011, 10:34 AM

## 2011-11-13 NOTE — Progress Notes (Signed)
1525 - Staining noted upon arrival to unit, staining marked, upon re-eval of site staining increased. Dr. Royann Shivers on floor and ordered pressure dsg.  Pressure dsg applied, pt tolerated well.  Will continue to monitor.  Ninetta Lights RN

## 2011-11-14 ENCOUNTER — Inpatient Hospital Stay (HOSPITAL_COMMUNITY): Payer: Medicare Other

## 2011-11-14 DIAGNOSIS — I441 Atrioventricular block, second degree: Secondary | ICD-10-CM | POA: Diagnosis not present

## 2011-11-14 DIAGNOSIS — I459 Conduction disorder, unspecified: Secondary | ICD-10-CM

## 2011-11-14 LAB — BASIC METABOLIC PANEL
BUN: 13 mg/dL (ref 6–23)
CO2: 20 mEq/L (ref 19–32)
Calcium: 9.4 mg/dL (ref 8.4–10.5)
Creatinine, Ser: 0.47 mg/dL — ABNORMAL LOW (ref 0.50–1.10)
GFR calc non Af Amer: >90 mL/min (ref >90)
Glucose, Bld: 90 mg/dL (ref 70–99)
Sodium: 140 mEq/L (ref 135–145)

## 2011-11-14 MED ORDER — HYDROCODONE-ACETAMINOPHEN 5-325 MG PO TABS: 1.0000 | ORAL_TABLET | ORAL | Status: AC | PRN

## 2011-11-14 MED ORDER — WARFARIN SODIUM 5 MG PO TABS: 5.0000 mg | ORAL_TABLET | Freq: Every day | ORAL | Status: DC

## 2011-11-14 NOTE — Discharge Summary (Signed)
CARDIOLOGY DISCHARGE SUMMARY   Patient ID: Rhonda [name] MRN: [id number] DOB/AGE: 11-20-1929 78 y.o.  Admit date: 11/12/2011 Discharge date: 11/14/2011  Primary Discharge Diagnosis: Atrial Fibrillation Secondary Discharge Diagnosis:  Past Medical History  Diagnosis Date  . PAF (paroxysmal atrial fibrillation)   . Coronary disease     s/p PTCA 1990s  . Hypertension   . Glaucoma   . LBBB (left bundle branch block)   . GERD (gastroesophageal reflux disease)   . Anemia   . GI bleed     previously with pradaxa  . Hyperlipidemia   . Pacemaker     temporary   Consults:   Procedures: 1. Comprehensive electrophysiologic study.  2. Coronary sinus pacing and recording.  3. Three-dimensional mapping of atrial fibrillation  4. Ablation of atrial fibrillation  5. Arterial blood pressure monitoring.  6. Intracardiac echocardiography.  7. Transseptal puncture of an intact septum.  8. Rotational Angiography with processing at an independent workstation  9. Temporary Transvenous pacemaker 10. Implantation of new dual chamber permanent pacemaker -  Medtronic Adapta model DDDR L1 serial number [id number] 11. Fluoroscopy  12. Light sedation  13. Left upper extremity venogram 14. 2 view CXR 15 TEE   Hospital Course: Rhonda [name] is a 78 year old female with a history of atrial fibrillation. She was referred to Dr Johney Frame for consideration of ablation. She came to the hospital for the procedure on 11/11/2011.   She had a TEE prior to the ablation which showed no thrombus, details below. She had the ablation and was successfully cardioverted to sinus rhythm but had Mobitz II heart block prior to the procedure which progressed to complete heart block, requiring a temporary pacer. Her Tikosyn was held.  Her intrinsic conduction returned the next day but with sinus node dysfunction, a permanent pacemaker was recommended. The permanent pacemaker (Medtronic Adapta model DDDR L1 serial number  [id number]) was inserted on 5/15 by Dr Royann Shivers without complication.   She had tolerated both procedures well. She had some bleeding at the pacemaker site but this resolved with a pressure dressing and will be followed by [facility] as an outpatient.   On 11/14/2011, Rhonda [name] had some soreness at the site but was otherwise without complaint. Her follow-up CXR showed no acute disease and no pneumothorax. She was evaluated by Dr Allyson Sabal and Dr Johney Frame and considered stable for discharge, to follow up as an outpatient.   Labs:   Lab Results  Component Value Date   WBC 6.6 11/13/2011   HGB 12.5 11/13/2011   HCT 36.0 11/13/2011   MCV 91.1 11/13/2011   PLT 171 11/13/2011    Lab 11/14/11 0710  NA 140  K 3.5  CL 109  CO2 20  BUN 13  CREATININE 0.47*  CALCIUM 9.4  PROT --  BILITOT --  ALKPHOS --  ALT --  AST --  GLUCOSE 90    Basename 11/14/11 0710  INR 4.37*      Ablation:  CONCLUSIONS:  1. Sinus rhythm upon presentation with mobitz II second degree AV block  2. Rotational Angiography reveals a moderate sized left atrium with four separate pulmonary veins without evidence of pulmonary vein stenosis.  3. Successful electrical isolation and anatomical encircling of all four pulmonary veins with radiofrequency current.  4. Successful cardioversion to sinus rhythm.  5. Progression of mobitz II AV block to complete heart block requiring temporary back up pacing   Radiology: Dg Chest 2 View 11/14/2011  *RADIOLOGY REPORT*  Clinical Data:  Post pacer placement  CHEST - 2 VIEW  Comparison: CT 6012  Findings: Normal cardiac silhouette.  Interval placement left pacemaker with two continuous leads.  Skin staples overlie the generator pack.  No pneumothorax.  Lungs are hyperinflated. Nodules in the right upper lobe are unchanged from prior.  IMPRESSION:  1.  No complication following pacer placement. 2.  Right lung nodules are stable from comparison CT 02/04/2011  Original Report Authenticated By: Genevive Bi, M.D.   EKG: 14-Nov-2011 06:35:20  Normal sinus rhythm Left bundle branch block Abnormal ECG Vent. rate 65 BPM PR interval 160 Rhonda QRS duration 138 Rhonda QT/QTc 434/451 Rhonda P-R-T axes 74 -20 102  Echo/TEE : 11/11/2011 Study Conclusions - Left ventricle: The cavity size was normal. Mild concentric LVH. Systolic function was normal. The estimated ejection fraction was in the range of 55% to 60%. Wall motion was normal; there were no regional wall motion abnormalities. - Aortic valve: No evidence of vegetation. - Aorta: Small amount of atheromatous plaque in the proximal ascending aorta. - Mitral valve: No evidence of vegetation. - Left atrium: The atrium was dilated. No evidence of thrombus in the appendage. - Right atrium: No evidence of thrombus in the atrial cavity or appendage. - Atrial septum: No defect or patent foramen ovale was identified. - Tricuspid valve: No evidence of vegetation. - Pulmonic valve: No evidence of vegetation.    FOLLOW UP PLANS AND APPOINTMENTS Discharge Orders    Future Appointments: Provider: Department: Dept Phone: Center:   12/25/2011 12:45 PM [name], MD [facility] [phone] [facility]   02/17/2012 10:15 AM [name], MD [facility] [phone] [facility]     Future Orders Please Complete By Expires   Diet - low sodium heart healthy      Increase activity slowly      Call MD for:  temperature >100.4      Call MD for:  severe uncontrolled pain      Call MD for:  redness, tenderness, or signs of infection (pain, swelling, redness, odor or green/yellow discharge around incision site)        Allergies  Allergen Reactions  . Beta Adrenergic Blockers     Bradycardia.  . Norvasc (Amlodipine Besylate)     edema  . Sulfa Drugs Cross Reactors Hives   Medication List  As of 11/14/2011 12:33 PM   STOP taking these medications         dofetilide 500 MCG capsule         TAKE these medications          ALPHAGAN P 0.15 % ophthalmic solution   Generic drug: brimonidine   Place 1 drop into the right eye 3 (three) times daily.      atorvastatin 40 MG tablet   Commonly known as: LIPITOR   Take 40 mg by mouth daily.      AZOR 5-40 MG per tablet   Generic drug: amLODipine-olmesartan   Take 1 tablet by mouth daily.      dorzolamide 2 % ophthalmic solution   Commonly known as: TRUSOPT   Place 1 drop into the right eye 2 (two) times daily.      hydrALAZINE 50 MG tablet   Commonly known as: APRESOLINE   Take 50 mg by mouth 2 (two) times daily.      HYDROcodone-acetaminophen 5-325 MG per tablet   Commonly known as: NORCO   Take 1-2 tablets by mouth every 4 (four) hours as needed.  INTEGRA PO   Take 1 tablet by mouth at bedtime.      iron polysaccharides 150 MG capsule   Commonly known as: NIFEREX   Take 150 mg by mouth 2 (two) times daily.      mulitivitamin with minerals Tabs   Take 1 tablet by mouth daily.      nebivolol 5 MG tablet   Commonly known as: BYSTOLIC   Take 5 mg by mouth 2 (two) times daily as needed. Heart rate over 100 take 1 tablet. If heart rate is in 90's take 1/2 tablet (2.5mg )      nitroGLYCERIN 0.4 MG SL tablet   Commonly known as: NITROSTAT   Place 0.4 mg under the tongue every 5 (five) minutes as needed. For chest pain.      OVER THE COUNTER MEDICATION   Take 1 tablet by mouth 3 (three) times daily. Calcium Magnesium Zinc Copper and vitamin d3      pantoprazole 40 MG tablet   Commonly known as: PROTONIX   Take 40 mg by mouth daily.      PATADAY 0.2 % Soln   Generic drug: Olopatadine HCl   Place 1 drop into both eyes daily.      SYSTANE OP   Place 1 drop into both eyes as needed. For moisture.      warfarin 5 MG tablet   Commonly known as: COUMADIN   Take 1 tablet (5 mg total) by mouth daily. HOLD Thurs, Fri. Take 1/2 tab Sat and start 1 tab daily on Sunday.           Follow-up Information    Follow up with Wilburt Finlay, PA on  11/19/2011. (1:00pm for pacer site check)    Contact information:   [address] [place] [address] [phone]       Follow up with Thurmon Fair, MD on 11/18/2011. (Office will call about INR check)    Contact information:   [address] [place] [address] [phone]       Follow up with [name], MD. (August 19th at  10:15 am)    Contact information:   [address] [place], [address] [place] [address] [phone]       Follow up with [name], MD. (June 26 th at 12:30 pm)    Contact information:   [address] [place], [address] [place] [address] [phone]       Follow up with LITTLE, ALFRED B, MD. (As needed)    Contact information:   [address] [place] [address] [phone]           BRING ALL MEDICATIONS WITH YOU TO FOLLOW UP APPOINTMENTS  Time spent with patient to include physician time: 42 min Signed: Theodore Demark 11/14/2011, 12:33 PM Co-Sign MD  I have seen, examined the patient, and reviewed the above assessment and plan.   Co Sign: [name], MD 11/14/2011 4:10 PM

## 2011-11-14 NOTE — Progress Notes (Signed)
Subjective:  No complaints  Objective:  Vital Signs in the last 24 hours: Temp:  [98.4 F (36.9 C)-98.5 F (36.9 C)] 98.5 F (36.9 C) (05/16 0627) Pulse Rate:  [60-70] 68  (05/16 0627) Resp:  [14-18] 18  (05/16 0627) BP: (128-153)/(55-79) 146/72 mmHg (05/16 0627) SpO2:  [95 %-96 %] 95 % (05/16 0627)  Intake/Output from previous day:  Intake/Output Summary (Last 24 hours) at 11/14/11 0858 Last data filed at 11/13/11 1000  Gross per 24 hour  Intake     60 ml  Output    300 ml  Net   -240 ml    Physical Exam: General appearance: alert, cooperative and no distress Lungs: clear to auscultation bilaterally Heart: regular rate and rhythm Pacer site without hematoma   Rate: 70  Rhythm: NSR, LBBB  Lab Results:  [facility] 11/13/11 0445  WBC 6.6  HGB 12.5  PLT 171    Basename 11/14/11 0710 11/13/11 0445  NA 140 140  K 3.5 3.0*  CL 109 107  CO2 20 21  GLUCOSE 90 96  BUN 13 8  CREATININE 0.47* 0.47*   No results found for this basename: TROPONINI:2,CK,MB:2 in the last 72 hours Hepatic Function Panel No results found for this basename: PROT,ALBUMIN,AST,ALT,ALKPHOS,BILITOT,BILIDIR,IBILI in the last 72 hours No results found for this basename: CHOL in the last 72 hours  Basename 11/14/11 0710  INR 4.37*    Imaging: Dg Chest 2 View  11/14/2011  *RADIOLOGY REPORT*  Clinical Data: Post pacer placement  CHEST - 2 VIEW  Comparison: CT 6012  Findings: Normal cardiac silhouette.  Interval placement left pacemaker with two continuous leads.  Skin staples overlie the generator pack.  No pneumothorax.  Lungs are hyperinflated. Nodules in the right upper lobe are unchanged from prior.  IMPRESSION:  1.  No complication following pacer placement. 2.  Right lung nodules are stable from comparison CT 02/04/2011  Original Report Authenticated By: Genevive Bi, M.D.    Cardiac Studies:  Assessment/Plan:   Active Problems:  Paroxysmal atrial fibrillation, S/P RFA  514/13  Heart  block, after RFA, S/P PTVDP 11/13/11  CAD (coronary artery disease)  Hypertension  Chronic anticoagulation  Plan- per Dr Johney Frame, we will arrange pacer site follow up after discharge.   Corine Shelter PA-C 11/14/2011, 8:58 AM  Agree with note written by Corine Shelter [facility]  S/Pfib ablation with CHB and subsequent PTVPM placed by Dr. Royann Shivers. INR supra therapeutic. CXR ok. Plan D/C home today. F/U with Dr. Salena Saner and Al as well as [facility].  Runell Gess 11/14/2011 10:05 AM

## 2011-11-14 NOTE — Progress Notes (Signed)
Patient: Rhonda [name] Date of Encounter: 11/14/2011, 7:56 AM Admit date: 11/12/2011     Subjective  Ms. Otterson reports soreness at her PPM site this AM but is otherwise without complaints. She denies angina, shortness of breath, palpitations or dizziness.  No focal neuro concerns   Objective   Filed Vitals:   11/14/11 0627  BP: 146/72  Pulse: 68  Temp: 98.5 F (36.9 C)  Resp: 18     Intake/Output Summary (Last 24 hours) at 11/14/11 0756 Last data filed at 11/13/11 1000  Gross per 24 hour  Intake    120 ml  Output    350 ml  Net   -230 ml    Physical Exam: General: Well developed, well appearing 78 year old female in no acute distress. Head: Normocephalic, atraumatic, sclera non-icteric, no xanthomas, nares are without discharge.  Neck: Supple. JVD not elevated. Lungs: Clear bilaterally to auscultation without wheezes, rales, or rhonchi. Breathing is unlabored. Heart: RRR S1 S2 without murmurs, rubs, or gallops.  Abdomen: Soft, non-distended.  Extremities: No clubbing or cyanosis. No edema.  Distal pedal pulses are 2+ and equal bilaterally. Groins without hematoma Neuro: Alert and oriented X 3. Moves all extremities spontaneously. No focal deficits. Pacemaker site without hematoma   Inpatient Medications:  . brimonidine  1 drop Right Eye TID  .  ceFAZolin (ANCEF) IV  1 g Intravenous Q6H  . dorzolamide  1 drop Right Eye BID  . hydrALAZINE  50 mg Oral BID  . pantoprazole  40 mg Oral Daily  . potassium chloride  40 mEq Oral Once  . potassium chloride  40 mEq Oral Once  . sodium chloride  3 mL Intravenous Q12H   Labs:  Basename 11/13/11 0445  NA 140  K 3.0*  CL 107  CO2 21  GLUCOSE 96  BUN 8  CREATININE 0.47*  CALCIUM 9.2  MG --  PHOS --    Basename 11/13/11 0445  WBC 6.6  NEUTROABS --  HGB 12.5  HCT 36.0  MCV 91.1  PLT 171   INR 4.37  Radiology/Studies: CXR this AM - Normal cardiac silhouette. Interval placement left pacemaker with two  continuous leads. Skin staples overlie the generator pack. No pneumothorax. Lungs are hyperinflated. Nodules in the right upper lobe are unchanged from prior. IMPRESSION: 1. No complication following pacer placement. 2. Right lung nodules are stable from comparison CT 02/04/2011 Original Report Authenticated By: Genevive Bi, M.D.  Telemetry: normal sinus rhythm with intermittent A pacing; AV pacing noted during device interrogation performed by industry this AM; single, brief/non-sustained episode of atrial fibrillation early this AM   Assessment and Plan  1.  Paroxysmal atrial fibrillation s/p RF ablation 11/12/2011 - doing well post ablation procedure; continue warfarin for embolic prophylaxis 2.  Mobitz II AV block - s/p PPM yesterday by Dr. Royann Shivers 3.  Coagulopathy secondary to chronic warfarin therapy - will hold warfarin today and tomorrow; restart Saturday  Signed, EDMISTEN, BROOKE PA-C   I have seen, examined the patient, and reviewed the above assessment and plan.  Doing well s/p afib ablation and subsequent PPM for baseline infrahisian block observed on EP study. Will DC to home today. Given supratherapeutic INR, she will need to hold coumadin until Saturday and take 2.5mg  Saturday, 5mg  Sunday. She will need INR check on Monday by [facility]. GIven recent ablation, INR will need to be closely managed between 2 and 3.  Pacemaker site, interrogation, and CXR are reviewed today and are  all stable. She will have a wound check by Dr C with [facility] in 10 days. Follow-up with me in 4 weeks.  Stop tikosyn but continue other home medicines.   Co Sign: [name], MD 11/14/2011 9:33 AM

## 2011-11-14 NOTE — Discharge Instructions (Signed)
Pacemaker Implantation Care After Always carry your pacemaker card with you. The card should list the implant date, device model and manufacturer.  HOME CARE INSTRUCTIONS  Keep the incision dry for a week after the procedure. It may take several weeks for the incision to heal.   For about 6 weeks, avoid sudden jerking, pulling or chopping movements that pull your arm away from your body. For instance, you should not play golf for 6 weeks.   Do not raise your arms above your shoulders for 1-2 weeks or as told by your caregiver.   Take medicine as told by your caregiver.   Learn how to check your pulse. Follow directions about when to call or be concerned.   Exercise as told by your caregiver.   Household appliances do not interfere with pacemakers.   Travel by airplane should not be a problem. Tell security you have a pacemaker before going through the metal detector. Carry your pacemaker ID card with you.   Never leave a cell phone in a pocket over the pacemaker.   Avoid strong electro-magnetic fields. You will not be able to have an MRI scan because of the strong magnets.  PACEMAKER CARE:  Avoid putting pressure over the area where the pacer was put in.   Digital cell phones should be kept 12 inches away from the pacemaker. Hold the cell phone to the ear opposite of the pacemaker.   Never leave a cell phone in a pocket over the pacemaker.   Avoid strong electro-magnetic fields. You will not be able to have an MRI scan because of the strong magnets.   Pacer batteries last about 5 years and give off warning signals when they are running low on power. Pacers may be checked every 3 months. This allows plenty of time to change the generator when it is running low on power.   Changing the battery means removing the old generator through the same cut and plugging the existing wires into the new generator.   An EKG or heart monitor is used to see if your pacer is working properly.  Sometimes signals may be sent over a land line phone to your clinic.   Wear a medical alert bracelet. This can help emergency responders know you have a pacemaker.  SEEK MEDICAL CARE IF:  You begin to gain weight and your feet and ankles swell.   You have dizzy spells or feel weak.   Your pulse rate drops below the limit or is too fast.   You have redness and swelling over your pacemaker insertion site.  SEEK IMMEDIATE MEDICAL CARE IF:  You faint or pass out.   You have chest pain or shortness of breath.   You are injured and think your pacemaker may have been damaged.   You are suddenly very tired or have pain in your back.   You have yellow drainage coming from the pacemaker incision site.   You are worried that your heart is not beating right or cannot feel your pulse.  Document Released: 01/04/2005 Document Revised: 06/06/2011 Document Reviewed: 01/18/2008 ExitCare Patient Information 2012 [employer]. 

## 2011-12-25 ENCOUNTER — Encounter: Payer: Self-pay | Admitting: Cardiology

## 2011-12-25 ENCOUNTER — Ambulatory Visit (INDEPENDENT_AMBULATORY_CARE_PROVIDER_SITE_OTHER): Payer: Medicare Other | Admitting: Cardiology

## 2011-12-25 VITALS — BP 140/80 | HR 87 | Temp 97.4°F | Wt 122.0 lb

## 2011-12-25 DIAGNOSIS — Z950 Presence of cardiac pacemaker: Secondary | ICD-10-CM

## 2011-12-25 DIAGNOSIS — I459 Conduction disorder, unspecified: Secondary | ICD-10-CM

## 2011-12-25 DIAGNOSIS — I4891 Unspecified atrial fibrillation: Secondary | ICD-10-CM

## 2011-12-25 LAB — CBC WITH DIFFERENTIAL/PLATELET
Basophils Absolute: 0 10*3/uL (ref 0.0–0.1)
Basophils Relative: 0.5 % (ref 0.0–3.0)
Eosinophils Relative: 0.5 % (ref 0.0–5.0)
Hemoglobin: 12.6 g/dL (ref 12.0–15.0)
Lymphocytes Relative: 38.5 % (ref 12.0–46.0)
Monocytes Relative: 8.9 % (ref 3.0–12.0)
Neutro Abs: 2.7 10*3/uL (ref 1.4–7.7)
RBC: 3.95 Mil/uL (ref 3.87–5.11)
WBC: 5.3 10*3/uL (ref 4.5–10.5)

## 2011-12-25 LAB — PACEMAKER DEVICE OBSERVATION
AL THRESHOLD: 0.5 V
ATRIAL PACING PM: 52
RV LEAD THRESHOLD: 0.5 V
VENTRICULAR PACING PM: 0.1

## 2011-12-25 NOTE — Progress Notes (Signed)
ELECTROPHYSIOLOGY OFFICE NOTE  Patient ID: Rhonda [name] MRN: [id number], DOB/AGE: 05-Feb-1930   Date of Visit: 12/25/2011  Primary Physician: [name], MD Primary Cardiologist: Thurmon Fair, MD Reason for Visit: 6-week post procedure follow-up  History of Present Illness Rhonda [name] is a pleasant 78 year old woman with symptomatic atrial fibrillation s/p recent RF ablation which was complicated by Mobitz II AV block post procedure requiring PPM implantation who presents today for hospital follow-up. She reports low grade fevers daily, ~99.0 degrees Fahrenheit. She denies chest pain, dysphagia or shortness of breath. She denies palpitations. She reports her implant site and groin sites have healed well. She has no other complaints/concerns.  Past Medical History  Diagnosis Date  . PAF (paroxysmal atrial fibrillation)      s/p RF ablation May 2013   . Coronary disease     s/p PTCA 1990s  . Hypertension   . Glaucoma   . LBBB (left bundle branch block)   . GERD (gastroesophageal reflux disease)   . Anemia   . GI bleed     previously with pradaxa  . Hyperlipidemia   . Pacemaker May 2013 ([employer] - followed by [facility])     Past Surgical History  Procedure Date  . Coronary angioplasty 1990s    by Dr Aleen Campi  . Colonoscopy   . Esophagogastrectomy   . Tee without cardioversion 11/11/2011    Procedure: TRANSESOPHAGEAL ECHOCARDIOGRAM (TEE);  Surgeon: Chrystie Nose, MD;  Location: [facility] ENDOSCOPY;  Service: Cardiovascular;  Laterality: N/A;    Allergies/Intolerances Allergen Reactions  . Beta Adrenergic Blockers     Bradycardia.  . Norvasc (Amlodipine Besylate)     edema  . Sulfa Drugs Cross Reactors Hives   Current Home Medications Medication Sig Dispense Refill  . amLODipine-olmesartan (AZOR) 5-40 MG per tablet Take 1 tablet by mouth daily.        Marland Kitchen atorvastatin (LIPITOR) 40 MG tablet Take 40 mg by mouth daily.        . brimonidine (ALPHAGAN P) 0.15 % ophthalmic  solution Place 1 drop into the right eye 3 (three) times daily.        . dorzolamide (TRUSOPT) 2 % ophthalmic solution Place 1 drop into the right eye 2 (two) times daily.        . Fe Fum-FePoly-Vit C-Vit B3 (INTEGRA PO) Take 1 tablet by mouth at bedtime.        . hydrALAZINE (APRESOLINE) 50 MG tablet Take 50 mg by mouth 2 (two) times daily.        . Multiple Vitamin (MULITIVITAMIN WITH MINERALS) TABS Take 1 tablet by mouth daily.      . nebivolol (BYSTOLIC) 5 MG tablet Take 5 mg by mouth 2 (two) times daily as needed. Heart rate over 100 take 1 tablet. If heart rate is in 90's take 1/2 tablet (2.5mg )      . nitroGLYCERIN (NITROSTAT) 0.4 MG SL tablet Place 0.4 mg under the tongue every 5 (five) minutes as needed. For chest pain.      Marland Kitchen Olopatadine HCl (PATADAY) 0.2 % SOLN Place 1 drop into both eyes daily.        Marland Kitchen OVER THE COUNTER MEDICATION Take 1 tablet by mouth 3 (three) times daily. Calcium Magnesium Zinc Copper and vitamin d3      . pantoprazole (PROTONIX) 40 MG tablet Take 40 mg by mouth daily.        Bertram Gala Glycol-Propyl Glycol (SYSTANE OP) Place 1 drop into both eyes  as needed. For moisture.      . warfarin (COUMADIN) 5 MG tablet Take 1 tablet (5 mg total) by mouth daily. HOLD Thurs, Fri. Take 1/2 tab Sat and start 1 tab daily on Sunday.       Social History Social History  . Marital Status: Married   Social History Main Topics  . Smoking status: Never Smoker   . Smokeless tobacco: No  . Alcohol Use: No  . Drug Use: No   Social History Narrative   Pt lives in [place].  Retired form [employer].Attends [employer]    Review of Systems General:  No night sweats or weight changes Cardiovascular:  No chest pain, dyspnea on exertion, edema, orthopnea, palpitations, paroxysmal nocturnal dyspnea Dermatological: No rash, lesions or masses Respiratory: No cough, dyspnea Urologic: No hematuria, dysuria Abdominal:   No nausea, vomiting, diarrhea, bright red blood per  rectum, melena, or hematemesis Neurologic:  No visual changes, weakness, changes in mental status All other systems reviewed and are otherwise negative except as noted above.  Physical Exam Vitals: Blood pressure 140/80, pulse 87, temp 97.4 F (36.3 C), weight 122 lb (55.339 kg).  General: Well developed, well appearing 78 year old female in no acute distress. HEENT: Normocephalic, atraumatic. EOMs intact. Sclera nonicteric. Oropharynx clear.  Neck: Supple. No JVD. Lungs: Respirations regular and unlabored, CTA bilaterally. No wheezes, rales or rhonchi. Heart: RRR. S1, S2 present. No murmurs, rub, S3 or S4. Abdomen: Soft, non-distended.  Extremities: No clubbing, cyanosis or edema. DP/PT/Radials 2+ and equal bilaterally. Psych: Normal affect. Neuro: Alert and oriented X 3. Moves all extremities spontaneously. Skin: Implant site on left upper chest is intact, well healed.   Diagnostics 12-lead ECG shows A paced V sensed at 87 bpm; LBBB present Device interrogation shows normal PPM function with stable lead parameters/measurements  Assessment and Plan 1. Low grade fever - occurring daily; none above 100 degrees Fahrenheit; probably secondary to inflammation from cardiac ablation which should resolve within a few weeks after RF ablation; however, will order CBC to check white blood cell count; PPM site intact and well healed without evidence of inflammation or infection; continue scheduled follow-up with primary cardiologist at [facility] who also follows her PPM; keep post procedure scheduled follow-up with Dr. Johney Frame in August  This plan of care was formulated with Dr. Johney Frame who was in to see the patient with me today. Rhonda [name] expressed verbal understanding and agrees with this plan of care. Signed, Kelven Flater, Nehemiah Settle, PA-C 12/25/2011, 1:32 PM

## 2011-12-25 NOTE — Patient Instructions (Signed)
Your physician recommends that you return for lab work in: today-- CBC  Keep your appointment with Dr Johney Frame as scheduled.

## 2012-01-09 ENCOUNTER — Telehealth: Payer: Self-pay | Admitting: Internal Medicine

## 2012-01-09 NOTE — Telephone Encounter (Signed)
She went to see the PA at her PCP. She has been put on Nystatin and a Pro-biotic  She is going to let Kristen in the [facility] clinic at [facility] know

## 2012-01-09 NOTE — Telephone Encounter (Signed)
New msg Pt called and said she has thrush. She is taking antibiotics-nystatin. Please call her back

## 2012-02-17 ENCOUNTER — Encounter: Payer: Self-pay | Admitting: Internal Medicine

## 2012-02-17 ENCOUNTER — Ambulatory Visit (INDEPENDENT_AMBULATORY_CARE_PROVIDER_SITE_OTHER): Payer: Medicare Other | Admitting: Internal Medicine

## 2012-02-17 VITALS — BP 136/67 | HR 74 | Resp 18 | Ht 66.0 in | Wt 124.8 lb

## 2012-02-17 DIAGNOSIS — I4891 Unspecified atrial fibrillation: Secondary | ICD-10-CM

## 2012-02-17 DIAGNOSIS — I459 Conduction disorder, unspecified: Secondary | ICD-10-CM

## 2012-02-17 DIAGNOSIS — I480 Paroxysmal atrial fibrillation: Secondary | ICD-10-CM

## 2012-02-17 DIAGNOSIS — I441 Atrioventricular block, second degree: Secondary | ICD-10-CM

## 2012-02-17 LAB — PACEMAKER DEVICE OBSERVATION
ATRIAL PACING PM: 59.7
BATTERY VOLTAGE: 2.79 V
RV LEAD AMPLITUDE: 22.4 mv
VENTRICULAR PACING PM: 0.1

## 2012-02-17 NOTE — Assessment & Plan Note (Signed)
Doing well s/p ablation without recurrence of afib post ablation No changes today  Return in 3 months  Follow-up with Dr Clarene Duke as scheduled

## 2012-02-17 NOTE — Patient Instructions (Addendum)
Your physician recommends that you schedule a follow-up appointment in 3 months with Dr Allred    

## 2012-02-17 NOTE — Assessment & Plan Note (Signed)
Normal pacemaker function See Pace Art report No changes today  

## 2012-02-17 NOTE — Progress Notes (Signed)
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 female who presents today for routine electrophysiology followup.  Since her recent afib ablation, the patient reports doing very well.  She has had no further afib.  She denies procedure related problems.  Today, she denies symptoms of palpitations, chest pain, shortness of breath,  lower extremity edema, dizziness, presyncope, or syncope.  The patient is otherwise without complaint today.   Past Medical History  Diagnosis Date  . PAF (paroxysmal atrial fibrillation)     s/p afib ablation  . Coronary disease     s/p PTCA 1990s  . Hypertension   . Glaucoma   . LBBB (left bundle branch block)   . GERD (gastroesophageal reflux disease)   . Anemia   . GI bleed     previously with pradaxa  . Hyperlipidemia   . Pacemaker     for infrahisian block observed at EP study/ mobitz II AV block   Past Surgical History  Procedure Date  . Coronary angioplasty 1990s    by Dr Aleen Campi  . Colonoscopy   . Esophagogastrectomy   . Tee without cardioversion 11/11/2011    Procedure: TRANSESOPHAGEAL ECHOCARDIOGRAM (TEE);  Surgeon: Chrystie Nose, MD;  Location: [facility] ENDOSCOPY;  Service: Cardiovascular;  Laterality: N/A;  . Atrial fibrillation ablation 11/12/11    PVI by Dr Johney Frame  . Pacemaker insertion 11/13/11    MDT implanted by Dr Royann Shivers for 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 observed on EP study    Current Outpatient Prescriptions  Medication Sig Dispense Refill  . amLODipine-olmesartan (AZOR) 5-40 MG per tablet Take 1 tablet by mouth daily.        Marland Kitchen atorvastatin (LIPITOR) 40 MG tablet Take 40 mg by mouth daily.        . brimonidine (ALPHAGAN P) 0.15 % ophthalmic solution Place 1 drop into the right eye 3 (three) times daily.        . dorzolamide (TRUSOPT) 2 % ophthalmic solution Place 1 drop into the right eye 2 (two) times daily.        . Fe Fum-FePoly-Vit C-Vit B3 (INTEGRA PO) Take 1 tablet by  mouth at bedtime.        . hydrALAZINE (APRESOLINE) 50 MG tablet Take 50 mg by mouth 2 (two) times daily.        . Multiple Vitamin (MULITIVITAMIN WITH MINERALS) TABS Take 1 tablet by mouth daily.      . Olopatadine HCl (PATADAY) 0.2 % SOLN Place 1 drop into both eyes daily.        Marland Kitchen OVER THE COUNTER MEDICATION Take 1 tablet by mouth 3 (three) times daily. Calcium Magnesium Zinc Copper and vitamin d3      . pantoprazole (PROTONIX) 40 MG tablet Take 40 mg by mouth daily.        Bertram Gala Glycol-Propyl Glycol (SYSTANE OP) Place 1 drop into both eyes as needed. For moisture.      . warfarin (COUMADIN) 5 MG tablet Take 1 tablet (5 mg total) by mouth daily. HOLD Thurs, Fri. Take 1/2 tab Sat and start 1 tab daily on Sunday.      . nebivolol (BYSTOLIC) 5 MG tablet Take 5 mg by mouth 2 (two) times daily as needed. Heart rate over 100 take 1 tablet. If heart rate is in 90's take 1/2 tablet (2.5mg )      . nitroGLYCERIN (NITROSTAT) 0.4 MG SL tablet Place 0.4 mg under the  tongue every 5 (five) minutes as needed. For chest pain.       No current facility-administered medications for this visit.   Facility-Administered Medications Ordered in Other Visits  Medication Dose Route Frequency Provider Last Rate Last Dose  . sodium chloride 0.9 % injection 3 mL  3 mL Intravenous PRN         Physical Exam: Filed Vitals:   02/17/12 1019  BP: 136/67  Pulse: 74  Resp: 18  Height: 5\' 6"  (1.676 m)  Weight: 124 lb 12.8 oz (56.609 kg)  SpO2: 97%    GEN- The patient is well appearing, alert and oriented x 3 today.   Head- normocephalic, atraumatic Eyes-  Sclera clear, conjunctiva pink Ears- hearing intact Oropharynx- clear Lungs- Clear to ausculation bilaterally, normal work of breathing Chest- pacemaker pocket is well healed Heart- Regular rate and rhythm, no murmurs, rubs or gallops, PMI not laterally displaced GI- soft, NT, ND, + BS Extremities- no clubbing, cyanosis, or edema  Pacemaker  interrogation- reviewed in detail today,  See PACEART report ekg today reveals atrial pacing at 72 bpm, LBBB, QRS  Assessment and Plan:

## 2012-02-26 NOTE — Addendum Note (Signed)
Addended by: Lacie Scotts on: 02/26/2012 04:25 PM   Modules accepted: Orders

## 2012-04-05 ENCOUNTER — Ambulatory Visit (INDEPENDENT_AMBULATORY_CARE_PROVIDER_SITE_OTHER): Payer: Medicare Other | Admitting: Emergency Medicine

## 2012-04-05 VITALS — BP 162/78 | HR 79 | Temp 97.3°F | Resp 16 | Ht 66.5 in | Wt 123.4 lb

## 2012-04-05 DIAGNOSIS — S91309A Unspecified open wound, unspecified foot, initial encounter: Secondary | ICD-10-CM

## 2012-04-05 DIAGNOSIS — L02619 Cutaneous abscess of unspecified foot: Secondary | ICD-10-CM

## 2012-04-05 DIAGNOSIS — L03119 Cellulitis of unspecified part of limb: Secondary | ICD-10-CM

## 2012-04-05 DIAGNOSIS — B009 Herpesviral infection, unspecified: Secondary | ICD-10-CM

## 2012-04-05 MED ORDER — VALACYCLOVIR HCL 1 G PO TABS: 1000.0000 mg | ORAL_TABLET | Freq: Two times a day (BID) | ORAL | Status: DC

## 2012-04-05 NOTE — Progress Notes (Signed)
  Subjective:    Patient ID: Rhonda [name], female    DOB: 1930-01-03, 78 y.o.   MRN: [id number]  HPI patient here with a blister type rash on the outer side of her left ankle. Initially this started as a burning sensation and after that she developed a cluster of blisters.    Review of Systems     Objective:   Physical Exam there is a 1.5 x 0.5" cluster of blisters on the lateral portion of the left ankle adjacent to the malleolus        Assessment & Plan:  This has the appearance is of an HSV infection. She is to keep the area clean with soap and water and we'll treat with Valtrex 1 g twice a day for 10 days cultures were done for routine and for HSV

## 2012-04-08 LAB — WOUND CULTURE
Gram Stain: NONE SEEN
Gram Stain: NONE SEEN

## 2012-04-08 LAB — HERPES SIMPLEX VIRUS CULTURE: Organism ID, Bacteria: NOT DETECTED

## 2012-04-14 ENCOUNTER — Ambulatory Visit (INDEPENDENT_AMBULATORY_CARE_PROVIDER_SITE_OTHER): Payer: Medicare Other

## 2012-04-14 DIAGNOSIS — Z23 Encounter for immunization: Secondary | ICD-10-CM

## 2012-05-18 ENCOUNTER — Ambulatory Visit (INDEPENDENT_AMBULATORY_CARE_PROVIDER_SITE_OTHER): Payer: Medicare Other | Admitting: Internal Medicine

## 2012-05-18 ENCOUNTER — Encounter: Payer: Self-pay | Admitting: Internal Medicine

## 2012-05-18 VITALS — BP 151/78 | HR 73 | Ht 66.5 in | Wt 123.6 lb

## 2012-05-18 DIAGNOSIS — I4891 Unspecified atrial fibrillation: Secondary | ICD-10-CM

## 2012-05-18 DIAGNOSIS — I480 Paroxysmal atrial fibrillation: Secondary | ICD-10-CM

## 2012-05-18 DIAGNOSIS — I441 Atrioventricular block, second degree: Secondary | ICD-10-CM

## 2012-05-18 LAB — PACEMAKER DEVICE OBSERVATION
AL IMPEDENCE PM: 474 Ohm
BATTERY VOLTAGE: 2.78 V
RV LEAD AMPLITUDE: 22.4 mv

## 2012-05-18 NOTE — Patient Instructions (Signed)
Your physician recommends that you schedule a follow-up appointment in: 3 months with Dr. Allred.  Your physician recommends that you continue on your current medications as directed. Please refer to the Current Medication list given to you today.    

## 2012-05-18 NOTE — Progress Notes (Signed)
PCP: Londell Moh, MD  Rhonda [name] is a 78 y.o. female who presents today for routine electrophysiology followup.  Since last being seen in our clinic, the patient reports doing very well.  She had one nocturnal episode of afib 05/09/12 with symptoms of palpitations.  She went to sleep and had returned to sinus upon waking.  She has had no further episodes.  Today, she denies symptoms of palpitations, chest pain, shortness of breath,  lower extremity edema, dizziness, presyncope, or syncope.  The patient is otherwise without complaint today.   Past Medical History  Diagnosis Date  . PAF (paroxysmal atrial fibrillation)     s/p afib ablation  . Coronary disease     s/p PTCA 1990s  . Hypertension   . Glaucoma(365)   . LBBB (left bundle branch block)   . GERD (gastroesophageal reflux disease)   . Anemia   . GI bleed     previously with pradaxa  . Hyperlipidemia   . Pacemaker     for infrahisian block observed at EP study/ mobitz II AV block   Past Surgical History  Procedure Date  . Coronary angioplasty 1990s    by Dr Aleen Campi  . Colonoscopy   . Esophagogastrectomy   . Tee without cardioversion 11/11/2011    Procedure: TRANSESOPHAGEAL ECHOCARDIOGRAM (TEE);  Surgeon: Chrystie Nose, MD;  Location: [facility] ENDOSCOPY;  Service: Cardiovascular;  Laterality: N/A;  . Atrial fibrillation ablation 11/12/11    PVI by Dr Johney Frame  . Pacemaker insertion 11/13/11    MDT implanted by Dr Royann Shivers for 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 observed on EP study    Current Outpatient Prescriptions  Medication Sig Dispense Refill  . amLODipine-olmesartan (AZOR) 5-40 MG per tablet Take 1 tablet by mouth daily.        Marland Kitchen atorvastatin (LIPITOR) 40 MG tablet Take 40 mg by mouth daily.        . brimonidine (ALPHAGAN P) 0.15 % ophthalmic solution Place 1 drop into the right eye 3 (three) times daily.        . dorzolamide (TRUSOPT) 2 % ophthalmic solution Place 1 drop into the right eye 2 (two)  times daily.        . Fe Fum-FePoly-Vit C-Vit B3 (INTEGRA PO) Take 1 tablet by mouth at bedtime.        . hydrALAZINE (APRESOLINE) 50 MG tablet Take 50 mg by mouth 2 (two) times daily.        . Multiple Vitamin (MULITIVITAMIN WITH MINERALS) TABS Take 1 tablet by mouth daily.      . nebivolol (BYSTOLIC) 5 MG tablet Take 5 mg by mouth 2 (two) times daily as needed. Heart rate over 100 take 1 tablet. If heart rate is in 90's take 1/2 tablet (2.5mg )      . nitroGLYCERIN (NITROSTAT) 0.4 MG SL tablet Place 0.4 mg under the tongue every 5 (five) minutes as needed. For chest pain.      Marland Kitchen Olopatadine HCl (PATADAY) 0.2 % SOLN Place 1 drop into both eyes daily.        Marland Kitchen OVER THE COUNTER MEDICATION Take 1 tablet by mouth 3 (three) times daily. Calcium Magnesium Zinc Copper and vitamin d3      . pantoprazole (PROTONIX) 40 MG tablet Take 40 mg by mouth daily.        Bertram Gala Glycol-Propyl Glycol (SYSTANE OP) Place 1 drop into both eyes as needed. For moisture.      . Travoprost (TRAVATAN  Z OP) Apply 1 drop to eye at bedtime. Right Eye only      . valACYclovir (VALTREX) 1000 MG tablet Take 1 tablet (1,000 mg total) by mouth 2 (two) times daily.  20 tablet  0  . warfarin (COUMADIN) 5 MG tablet Take 5 mg by mouth daily. 5mg  daily, fridays 2.5mg       . [DISCONTINUED] warfarin (COUMADIN) 5 MG tablet Take 1 tablet (5 mg total) by mouth daily. HOLD Thurs, Fri. Take 1/2 tab Sat and start 1 tab daily on Sunday.       No current facility-administered medications for this visit.   Facility-Administered Medications Ordered in Other Visits  Medication Dose Route Frequency Provider Last Rate Last Dose  . sodium chloride 0.9 % injection 3 mL  3 mL Intravenous PRN         Physical Exam: Filed Vitals:   05/18/12 1028  BP: 151/78  Pulse: 73  Height: 5' 6.5" (1.689 m)  Weight: 123 lb 9.6 oz (56.065 kg)  SpO2: 98%    GEN- The patient is well appearing, alert and oriented x 3 today.   Head- normocephalic,  atraumatic Eyes-  Sclera clear, conjunctiva pink Ears- hearing intact Oropharynx- clear Lungs- Clear to ausculation bilaterally, normal work of breathing Heart- Regular rate and rhythm, no murmurs, rubs or gallops, PMI not laterally displaced GI- soft, NT, ND, + BS Extremities- no clubbing, cyanosis, or edema  Pacemaker interrogation today is reviewed and normal (see paceart)  Assessment and Plan:  Paroxysmal atrial fibrillation  Doing well s/p ablation with one short episode of afib post ablation No changes today  Return in 3 months    Second degree Mobitz II AV block  See Pace Art report  No changes today

## 2012-06-12 ENCOUNTER — Other Ambulatory Visit: Payer: Self-pay | Admitting: Internal Medicine

## 2012-06-12 DIAGNOSIS — R911 Solitary pulmonary nodule: Secondary | ICD-10-CM

## 2012-06-25 ENCOUNTER — Ambulatory Visit
Admission: RE | Admit: 2012-06-25 | Discharge: 2012-06-25 | Disposition: A | Discharge status: Home / Self Care | Payer: Medicare Other | Source: Ambulatory Visit | Attending: Internal Medicine | Admitting: Internal Medicine

## 2012-06-25 DIAGNOSIS — R911 Solitary pulmonary nodule: Secondary | ICD-10-CM

## 2012-08-20 ENCOUNTER — Encounter: Payer: Self-pay | Admitting: Internal Medicine

## 2012-08-20 ENCOUNTER — Ambulatory Visit (INDEPENDENT_AMBULATORY_CARE_PROVIDER_SITE_OTHER): Payer: Medicare Other | Admitting: Cardiology

## 2012-08-20 ENCOUNTER — Encounter: Payer: Self-pay | Admitting: Cardiology

## 2012-08-20 VITALS — BP 170/72 | HR 75 | Ht 66.0 in | Wt 126.0 lb

## 2012-08-20 DIAGNOSIS — I441 Atrioventricular block, second degree: Secondary | ICD-10-CM

## 2012-08-20 DIAGNOSIS — I480 Paroxysmal atrial fibrillation: Secondary | ICD-10-CM

## 2012-08-20 DIAGNOSIS — Z950 Presence of cardiac pacemaker: Secondary | ICD-10-CM

## 2012-08-20 DIAGNOSIS — I4891 Unspecified atrial fibrillation: Secondary | ICD-10-CM

## 2012-08-20 LAB — PACEMAKER DEVICE OBSERVATION
AL THRESHOLD: 0.5 V
BAMS-0001: 155 {beats}/min
RV LEAD AMPLITUDE: 22.4 mv
RV LEAD THRESHOLD: 0.5 V

## 2012-08-20 NOTE — Patient Instructions (Addendum)
Your physician recommends that you schedule a follow-up appointment in: May with Dr Johney Frame

## 2012-08-20 NOTE — Progress Notes (Signed)
ELECTROPHYSIOLOGY OFFICE NOTE  Patient ID: Rhonda [name] MRN: [id number], DOB/AGE: 1929/11/05   Date of Visit: 08/20/2012  Primary Physician: Londell Moh, MD Primary Cardiologist: Thurmon Fair, MD Primary EP: [name], MD Reason for Visit: EP/device/AF follow-up  History of Present Illness  Rhonda [name] is an 79 year old woman with CAD and PAF s/p AF ablation May 2013 which was complicated by Mobitz II AV block requiring PPM implantation who presents today for routine 3 month electrophysiology/device followup. Since last being seen in our clinic, she reports she is doing well. Today, she denies chest pain, shortness of breath. She denies palpitations, dizziness, near syncope or syncope. She denies LE swelling, orthopnea, PND or recent weight gain. Ms. Brunke reports she is compliant and tolerating medications without difficulty.  Past Medical History Past Medical History  Diagnosis Date  . PAF (paroxysmal atrial fibrillation)     s/p afib ablation  . Coronary disease     s/p PTCA 1990s  . Hypertension   . Glaucoma(365)   . LBBB (left bundle branch block)   . GERD (gastroesophageal reflux disease)   . Anemia   . GI bleed     previously with pradaxa  . Hyperlipidemia   . Pacemaker     for infrahisian block observed at EP study/ mobitz II AV block    Past Surgical History Past Surgical History  Procedure Laterality Date  . Coronary angioplasty  1990s    by Dr Aleen Campi  . Colonoscopy    . Esophagogastrectomy    . Tee without cardioversion  11/11/2011    Procedure: TRANSESOPHAGEAL ECHOCARDIOGRAM (TEE);  Surgeon: Chrystie Nose, MD;  Location: [facility] ENDOSCOPY;  Service: Cardiovascular;  Laterality: N/A;  . Atrial fibrillation ablation  11/12/11    PVI by Dr Johney Frame  . Pacemaker insertion  11/13/11    MDT implanted by Dr Royann Shivers for 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 observed on EP study     Allergies/Intolerances Allergies  Allergen Reactions  .  Beta Adrenergic Blockers     Bradycardia.  . Norvasc (Amlodipine Besylate)     edema  . Sulfa Drugs Cross Reactors Hives    Current Home Medications Current Outpatient Prescriptions  Medication Sig Dispense Refill  . amLODipine-olmesartan (AZOR) 5-40 MG per tablet Take 1 tablet by mouth daily.        Marland Kitchen atorvastatin (LIPITOR) 40 MG tablet Take 40 mg by mouth daily.        . brimonidine (ALPHAGAN P) 0.15 % ophthalmic solution Place 1 drop into the right eye 3 (three) times daily.        . dorzolamide (TRUSOPT) 2 % ophthalmic solution Place 1 drop into the right eye 2 (two) times daily.        . Fe Fum-FePoly-Vit C-Vit B3 (INTEGRA PO) Take 1 tablet by mouth at bedtime.        . hydrALAZINE (APRESOLINE) 50 MG tablet Take 50 mg by mouth 2 (two) times daily.        . Multiple Vitamin (MULITIVITAMIN WITH MINERALS) TABS Take 1 tablet by mouth daily.      . nebivolol (BYSTOLIC) 5 MG tablet Take 5 mg by mouth 2 (two) times daily as needed. Heart rate over 100 take 1 tablet. If heart rate is in 90's take 1/2 tablet (2.5mg )      . nitroGLYCERIN (NITROSTAT) 0.4 MG SL tablet Place 0.4 mg under the tongue every 5 (five) minutes as needed. For chest pain.      Marland Kitchen  Olopatadine HCl (PATADAY) 0.2 % SOLN Place 1 drop into both eyes daily.        Marland Kitchen OVER THE COUNTER MEDICATION Take 1 tablet by mouth 3 (three) times daily. Calcium Magnesium Zinc Copper and vitamin d3      . pantoprazole (PROTONIX) 40 MG tablet Take 40 mg by mouth daily.        Bertram Gala Glycol-Propyl Glycol (SYSTANE OP) Place 1 drop into both eyes as needed. For moisture.      . Travoprost (TRAVATAN Z OP) Apply 1 drop to eye at bedtime. Right Eye only      . warfarin (COUMADIN) 5 MG tablet         Social History Social History  . Marital Status: Married   Social History Main Topics  . Smoking status: Never Smoker   . Smokeless tobacco: Not on file  . Alcohol Use: No  . Drug Use: No   Social History Narrative   Pt lives in [place].   Retired form [employer].   Attends [employer]    Review of Systems General: No chills, fever, night sweats or weight changes Cardiovascular: No chest pain, dyspnea on exertion, edema, orthopnea, palpitations, paroxysmal nocturnal dyspnea Dermatological: No rash, lesions or masses Respiratory: No cough, dyspnea Urologic: No hematuria, dysuria Abdominal: No nausea, vomiting, diarrhea, bright red blood per rectum, melena, or hematemesis Neurologic: No visual changes, weakness, changes in mental status All other systems reviewed and are otherwise negative except as noted above.  Physical Exam Blood pressure 170/72, pulse 75, height 5\' 6"  (1.676 m), weight 126 lb (57.153 kg), SpO2 98.00%.  General: Well developed, well appearing 79 year old female in no acute distress. HEENT: Normocephalic, atraumatic. EOMs intact. Sclera nonicteric. Oropharynx clear.  Neck: Supple without bruits. No JVD. Lungs: Respirations regular and unlabored, CTA bilaterally. No wheezes, rales or rhonchi. Heart: RRR. S1, S2 present. No murmurs, rub, S3 or S4. Abdomen: Soft, non-distended.  Extremities: No clubbing, cyanosis or edema. DP/PT/Radials 2+ and equal bilaterally. Psych: Normal affect. Neuro: Alert and oriented X 3. Moves all extremities spontaneously.   Diagnostics Device interrogation performed today Normal device function. Thresholds, sensing, impedances consistent with previous measurements. Device programmed to maximize longevity. 4 mode switch episodes, <0.1% of the time, 0 AHR episodes. 16 VHR episodes, longest 5 seconds in duration. No evidence of sustained atrial fibrillation. Device programmed at appropriate safety margins. Histogram distribution appropriate for patient activity level. Device programmed to optimize intrinsic conduction. Estimated longevity 12 years.    Assessment and Plan 1. Second degree AV block, Mobitz II s/p PPM implant Normal device function No programming changes  made See PaceArt report Return to clinic for follow-up with Dr. Johney Frame in 3 months 2. Atrial fibrillation No evidence of sustained AF by device interrogation today Continue current medical regimen as previously directed by Dr. Johney Frame 3. HTN Ms. Quant reports she has "whitecoat hypertension" and states her BP is normal when she checks it at home Followed by her PCP  Signed, Rick Duff, PA-C 08/20/2012, 12:43 PM

## 2012-10-17 ENCOUNTER — Encounter: Payer: Self-pay | Admitting: Pharmacist Clinician (PhC)/ Clinical Pharmacy Specialist

## 2012-10-17 DIAGNOSIS — Z7901 Long term (current) use of anticoagulants: Secondary | ICD-10-CM

## 2012-10-17 DIAGNOSIS — I480 Paroxysmal atrial fibrillation: Secondary | ICD-10-CM

## 2012-11-18 ENCOUNTER — Encounter: Payer: Self-pay | Admitting: Internal Medicine

## 2012-11-18 ENCOUNTER — Ambulatory Visit (INDEPENDENT_AMBULATORY_CARE_PROVIDER_SITE_OTHER): Payer: Medicare Other | Admitting: Internal Medicine

## 2012-11-18 VITALS — BP 188/94 | HR 79 | Ht 66.0 in | Wt 123.4 lb

## 2012-11-18 DIAGNOSIS — I480 Paroxysmal atrial fibrillation: Secondary | ICD-10-CM

## 2012-11-18 DIAGNOSIS — I441 Atrioventricular block, second degree: Secondary | ICD-10-CM

## 2012-11-18 DIAGNOSIS — I4891 Unspecified atrial fibrillation: Secondary | ICD-10-CM

## 2012-11-18 LAB — PACEMAKER DEVICE OBSERVATION
AL AMPLITUDE: 2.8 mv
AL IMPEDENCE PM: 467 Ohm
BATTERY VOLTAGE: 2.78 V
RV LEAD AMPLITUDE: 16 mv

## 2012-11-18 NOTE — Progress Notes (Signed)
PCP:  Londell Moh, MD  The patient presents today for routine electrophysiology followup.  Since last being seen in our clinic, the patient reports doing very well.  Today, she denies symptoms of palpitations, chest pain, shortness of breath, orthopnea, PND, lower extremity edema, dizziness, presyncope, syncope, or neurologic sequela.  The patient feels that she is tolerating medications without difficulties and is otherwise without complaint today.   Patient is s/p PVI in May of 2013 with no significant return of atrial fibrillation.    Her Coumadin is followed by her PCP, Dr Renne Crigler.   Past Medical History  Diagnosis Date  . PAF (paroxysmal atrial fibrillation)     s/p afib ablation  . Coronary disease     s/p PTCA 1990s  . Hypertension   . Glaucoma(365)   . LBBB (left bundle branch block)   . GERD (gastroesophageal reflux disease)   . Anemia   . GI bleed     previously with pradaxa  . Hyperlipidemia   . Pacemaker     for infrahisian block observed at EP study/ mobitz II AV block   Past Surgical History  Procedure Laterality Date  . Coronary angioplasty  1990s    by Dr Aleen Campi  . Colonoscopy    . Esophagogastrectomy    . Tee without cardioversion  11/11/2011    Procedure: TRANSESOPHAGEAL ECHOCARDIOGRAM (TEE);  Surgeon: Chrystie Nose, MD;  Location[facility] ENDOSCOPY;  Service: Cardiovascular;  Laterality: N/A;  . Atrial fibrillation ablation  11/12/11    PVI by Dr Johney Frame  . Pacemaker insertion  11/13/11    MDT implanted by Dr Royann Shivers for 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 observed on EP study    Current Outpatient Prescriptions  Medication Sig Dispense Refill  . amLODipine-olmesartan (AZOR) 5-40 MG per tablet Take 1 tablet by mouth daily.        Marland Kitchen atorvastatin (LIPITOR) 40 MG tablet Take 40 mg by mouth daily.        . brimonidine (ALPHAGAN P) 0.15 % ophthalmic solution Place 1 drop into the right eye 3 (three) times daily.        . dorzolamide (TRUSOPT)  2 % ophthalmic solution Place 1 drop into the right eye 2 (two) times daily.        . Fe Fum-FePoly-Vit C-Vit B3 (INTEGRA PO) Take 1 tablet by mouth at bedtime.        . hydrALAZINE (APRESOLINE) 50 MG tablet Take 50 mg by mouth 2 (two) times daily.        . Multiple Vitamin (MULITIVITAMIN WITH MINERALS) TABS Take 1 tablet by mouth daily.      . nebivolol (BYSTOLIC) 5 MG tablet Take 5 mg by mouth 2 (two) times daily as needed. Heart rate over 100 take 1 tablet. If heart rate is in 90's take 1/2 tablet (2.5mg )      . nitroGLYCERIN (NITROSTAT) 0.4 MG SL tablet Place 0.4 mg under the tongue every 5 (five) minutes as needed. For chest pain.      Marland Kitchen Olopatadine HCl (PATADAY) 0.2 % SOLN Place 1 drop into both eyes daily.        Marland Kitchen OVER THE COUNTER MEDICATION Take 1 tablet by mouth 3 (three) times daily. Calcium Magnesium Zinc Copper and vitamin d3      . pantoprazole (PROTONIX) 40 MG tablet Take 40 mg by mouth daily.        Bertram Gala Glycol-Propyl Glycol (SYSTANE OP) Place 1 drop into both eyes  as needed. For moisture.      . Travoprost (TRAVATAN Z OP) Apply 1 drop to eye at bedtime. Right Eye only      . warfarin (COUMADIN) 5 MG tablet        No current facility-administered medications for this visit.   Facility-Administered Medications Ordered in Other Visits  Medication Dose Route Frequency Provider Last Rate Last Dose  . sodium chloride 0.9 % injection 3 mL  3 mL Intravenous PRN         Allergies  Allergen Reactions  . Beta Adrenergic Blockers     Bradycardia.  . Norvasc (Amlodipine Besylate)     edema  . Sulfa Drugs Cross Reactors Hives    History   Social History  . Marital Status: Married    Spouse Name: N/A    Number of Children: N/A  . Years of Education: N/A   Occupational History  . Not on file.   Social History Main Topics  . Smoking status: Never Smoker   . Smokeless tobacco: Not on file  . Alcohol Use: No  . Drug Use: No  . Sexually Active: Not Currently   Other  Topics Concern  . Not on file   Social History Narrative   Pt lives in [place].  Retired form [employer].   Attends [employer]    Family History  Problem Relation Age of Onset  . Hypertension     Physical Exam: Filed Vitals:   11/18/12 1142  BP: 188/94  Pulse: 79  Height: 5\' 6"  (1.676 m)  Weight: 123 lb 6.4 oz (55.974 kg)    GEN- The patient is well appearing, alert and oriented x 3 today.   Head- normocephalic, atraumatic Eyes-  Sclera clear, conjunctiva pink Ears- hearing intact Oropharynx- clear Neck- supple, no JVP Lymph- no cervical lymphadenopathy Lungs- Clear to ausculation bilaterally, normal work of breathing Heart- Regular rate and rhythm, no murmurs, rubs or gallops, PMI not laterally displaced GI- soft, NT, ND, + BS Extremities- no clubbing, cyanosis, or edema MS- no significant deformity or atrophy Skin- no rash or lesion Psych- euthymic mood, full affect Neuro- strength and sensation are intact  Device interrogation today is reviewed and normal  Assessment and Plan:  1. Second degree AV block Normal pacemaker function See Pace Art report No changes today  2. afib Well controlled Continue coumadin  Return to see me in 6 months

## 2012-11-18 NOTE — Patient Instructions (Addendum)
Remote monitoring is used to monitor your Pacemaker of ICD from home. This monitoring reduces the number of office visits required to check your device to one time per year. It allows [place] to keep an eye on the functioning of your device to ensure it is working properly. You are scheduled for a device check from home on February 22, 2013. You may send your transmission at any time that day. If you have a wireless device, the transmission will be sent automatically. After your physician reviews your transmission, you will receive a postcard with your next transmission date.    Your physician wants you to follow-up in: 6 months with Dr Jacquiline Doe will receive a reminder letter in the mail two months in advance. If you don't receive a letter, please call our office to schedule the follow-up appointment.

## 2012-11-20 MED ORDER — HYDRALAZINE HCL 50 MG PO TABS: 50.0000 mg | ORAL_TABLET | Freq: Two times a day (BID) | ORAL | Status: DC

## 2012-11-20 MED ORDER — AMLODIPINE-OLMESARTAN 5-40 MG PO TABS: 1.0000 | ORAL_TABLET | Freq: Every day | ORAL | Status: DC

## 2013-02-22 ENCOUNTER — Other Ambulatory Visit: Payer: Self-pay | Admitting: Cardiovascular Disease

## 2013-02-22 DIAGNOSIS — I441 Atrioventricular block, second degree: Secondary | ICD-10-CM

## 2013-02-22 DIAGNOSIS — I442 Atrioventricular block, complete: Secondary | ICD-10-CM

## 2013-02-22 LAB — PACEMAKER DEVICE OBSERVATION

## 2013-03-04 ENCOUNTER — Encounter: Payer: Self-pay | Admitting: *Deleted

## 2013-03-04 LAB — REMOTE PACEMAKER DEVICE
AL AMPLITUDE: 2.8 mv
AL THRESHOLD: 0.5 V
ATRIAL PACING PM: 69.5
BAMS-0001: 155 {beats}/min
RV LEAD THRESHOLD: 0.5 V
VENTRICULAR PACING PM: 0.1

## 2013-03-15 ENCOUNTER — Telehealth: Payer: Self-pay | Admitting: Pharmacist Clinician (PhC)/ Clinical Pharmacy Specialist

## 2013-03-15 NOTE — Telephone Encounter (Signed)
Overdue INR letter sent 

## 2013-03-25 ENCOUNTER — Telehealth: Payer: Self-pay | Admitting: Pharmacist Clinician (PhC)/ Clinical Pharmacy Specialist

## 2013-03-25 ENCOUNTER — Ambulatory Visit: Payer: Self-pay | Admitting: Pharmacist Clinician (PhC)/ Clinical Pharmacy Specialist

## 2013-03-25 DIAGNOSIS — Z7901 Long term (current) use of anticoagulants: Secondary | ICD-10-CM

## 2013-03-25 DIAGNOSIS — I480 Paroxysmal atrial fibrillation: Secondary | ICD-10-CM

## 2013-03-25 NOTE — Telephone Encounter (Signed)
Rhonda [name] states that she is now seeing Dr. Johney Frame at [place] and having her protime done at her primary care physician at [facility].

## 2013-04-05 ENCOUNTER — Encounter: Payer: Self-pay | Admitting: Cardiovascular Disease

## 2013-05-17 ENCOUNTER — Ambulatory Visit: Payer: Medicare Other | Admitting: Internal Medicine

## 2013-05-31 ENCOUNTER — Encounter (INDEPENDENT_AMBULATORY_CARE_PROVIDER_SITE_OTHER): Payer: Self-pay

## 2013-05-31 ENCOUNTER — Encounter: Payer: Self-pay | Admitting: Internal Medicine

## 2013-05-31 ENCOUNTER — Ambulatory Visit (INDEPENDENT_AMBULATORY_CARE_PROVIDER_SITE_OTHER): Payer: Medicare Other | Admitting: Internal Medicine

## 2013-05-31 VITALS — BP 158/60 | HR 90 | Ht 66.0 in | Wt 122.0 lb

## 2013-05-31 DIAGNOSIS — I480 Paroxysmal atrial fibrillation: Secondary | ICD-10-CM

## 2013-05-31 DIAGNOSIS — Z7901 Long term (current) use of anticoagulants: Secondary | ICD-10-CM

## 2013-05-31 DIAGNOSIS — I4891 Unspecified atrial fibrillation: Secondary | ICD-10-CM

## 2013-05-31 DIAGNOSIS — R011 Cardiac murmur, unspecified: Secondary | ICD-10-CM

## 2013-05-31 DIAGNOSIS — I10 Essential (primary) hypertension: Secondary | ICD-10-CM

## 2013-05-31 MED ORDER — DILTIAZEM HCL 30 MG PO TABS: 30.0000 mg | ORAL_TABLET | Freq: Four times a day (QID) | ORAL | Status: DC

## 2013-05-31 NOTE — Progress Notes (Signed)
PCP: Londell Moh, MD  Rhonda [name] is a 79 y.o. female who presents today for routine electrophysiology followup.  Since last being seen in our clinic, the patient reports doing very well.  She had one nocturnal episode of afib 05/11/13 with symptoms of palpitations.  She went to sleep and had returned to sinus upon waking.  She has had no further episodes.  Today, she denies symptoms of palpitations, chest pain, shortness of breath,  lower extremity edema, dizziness, presyncope, or syncope.  The patient is otherwise without complaint today.   Past Medical History  Diagnosis Date  . PAF (paroxysmal atrial fibrillation)     s/p afib ablation  . Coronary disease     s/p PTCA 1990s  . Hypertension   . Glaucoma   . LBBB (left bundle branch block)   . GERD (gastroesophageal reflux disease)   . Anemia   . GI bleed     previously with pradaxa  . Hyperlipidemia   . Pacemaker     for infrahisian block observed at EP study/ mobitz II AV block   Past Surgical History  Procedure Laterality Date  . Coronary angioplasty  1990s    by Dr Aleen Campi  . Colonoscopy    . Esophagogastrectomy    . Tee without cardioversion  11/11/2011    Procedure: TRANSESOPHAGEAL ECHOCARDIOGRAM (TEE);  Surgeon: Chrystie Nose, MD;  Location: [facility] ENDOSCOPY;  Service: Cardiovascular;  Laterality: N/A;  . Atrial fibrillation ablation  11/12/11    PVI by Dr Johney Frame  . Pacemaker insertion  11/13/11    MDT implanted by Dr Royann Shivers for 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 observed on EP study    Current Outpatient Prescriptions  Medication Sig Dispense Refill  . amLODipine-olmesartan (AZOR) 5-40 MG per tablet Take 1 tablet by mouth daily.  90 tablet  3  . atorvastatin (LIPITOR) 40 MG tablet Take 40 mg by mouth daily.        . brimonidine (ALPHAGAN P) 0.15 % ophthalmic solution Place 1 drop into the right eye 3 (three) times daily.        . Cholecalciferol (VITAMIN D3) 1000 UNITS CAPS Take 1 capsule by mouth  daily.      . dorzolamide (TRUSOPT) 2 % ophthalmic solution Place 1 drop into the right eye 2 (two) times daily.        . hydrALAZINE (APRESOLINE) 50 MG tablet Take 1 tablet (50 mg total) by mouth 2 (two) times daily.  180 tablet  3  . Multiple Vitamin (MULITIVITAMIN WITH MINERALS) TABS Take 1 tablet by mouth daily.      . nitroGLYCERIN (NITROSTAT) 0.4 MG SL tablet Place 0.4 mg under the tongue every 5 (five) minutes as needed. For chest pain.      Marland Kitchen Olopatadine HCl (PATADAY) 0.2 % SOLN Place 1 drop into both eyes daily.        Marland Kitchen OVER THE COUNTER MEDICATION Take 1 tablet by mouth 3 (three) times daily. Calcium Magnesium Zinc Copper and vitamin d3      . pantoprazole (PROTONIX) 40 MG tablet Take 40 mg by mouth daily.        Bertram Gala Glycol-Propyl Glycol (SYSTANE OP) Place 1 drop into both eyes as needed. For moisture.      . Travoprost (TRAVATAN Z OP) Apply 1 drop to eye at bedtime. Right Eye only      . warfarin (COUMADIN) 5 MG tablet Take as directed by coumadin clinic  No current facility-administered medications for this visit.   Facility-Administered Medications Ordered in Other Visits  Medication Dose Route Frequency Provider Last Rate Last Dose  . sodium chloride 0.9 % injection 3 mL  3 mL Intravenous PRN         Physical Exam: Filed Vitals:   05/31/13 1107  BP: 158/60  Pulse: 90  Height: 5\' 6"  (1.676 m)  Weight: 122 lb (55.339 kg)    GEN- The patient is well appearing, alert and oriented x 3 today.   Head- normocephalic, atraumatic Eyes-  Sclera clear, conjunctiva pink Ears- hearing intact Oropharynx- clear Lungs- Clear to ausculation bilaterally, normal work of breathing Heart- Regular rate and rhythm, 2/6 SEM at the apex GI- soft, NT, ND, + BS Extremities- no clubbing, cyanosis, or edema  Pacemaker interrogation today is reviewed and normal (see paceart)  Assessment and Plan:  Paroxysmal atrial fibrillation  Doing well s/p ablation with one short episode of  afib post ablation No changes today  She will take cardizem as needed for afib with RVR Return in 3 months  Given murmur and recurrent episode of afib, will obtain an echo to evaluate for structural changes   Second degree Mobitz II AV block  See Pace Art report  No changes today  [facility] Return to see me in 6 months

## 2013-05-31 NOTE — Patient Instructions (Signed)
Your physician wants you to follow-up in: 6 months with Dr Jacquiline Doe will receive a reminder letter in the mail two months in advance. If you don't receive a letter, please call our office to schedule the follow-up appointment.  Remote monitoring is used to monitor your Pacemaker or ICD from home. This monitoring reduces the number of office visits required to check your device to one time per year. It allows [place] to keep an eye on the functioning of your device to ensure it is working properly. You are scheduled for a device check from home on 08/31/13. You may send your transmission at any time that day. If you have a wireless device, the transmission will be sent automatically. After your physician reviews your transmission, you will receive a postcard with your next transmission date.  Your physician has requested that you have an echocardiogram. Echocardiography is a painless test that uses sound waves to create images of your heart. It provides your doctor with information about the size and shape of your heart and how well your heart's chambers and valves are working. This procedure takes approximately one hour. There are no restrictions for this procedure.  Your physician has recommended you make the following change in your medication:  1) Start Cardizem 30mg  as needed for afib---1 every 6 hours as needed

## 2013-06-03 ENCOUNTER — Encounter: Payer: Self-pay | Admitting: Cardiovascular Disease

## 2013-06-16 ENCOUNTER — Encounter: Payer: Self-pay | Admitting: Cardiology

## 2013-06-16 ENCOUNTER — Ambulatory Visit (HOSPITAL_COMMUNITY): Payer: Medicare Other | Attending: Cardiology | Admitting: Radiology

## 2013-06-16 DIAGNOSIS — I2510 Atherosclerotic heart disease of native coronary artery without angina pectoris: Secondary | ICD-10-CM | POA: Insufficient documentation

## 2013-06-16 DIAGNOSIS — I447 Left bundle-branch block, unspecified: Secondary | ICD-10-CM | POA: Insufficient documentation

## 2013-06-16 DIAGNOSIS — E785 Hyperlipidemia, unspecified: Secondary | ICD-10-CM | POA: Insufficient documentation

## 2013-06-16 DIAGNOSIS — I10 Essential (primary) hypertension: Secondary | ICD-10-CM | POA: Insufficient documentation

## 2013-06-16 DIAGNOSIS — I4891 Unspecified atrial fibrillation: Secondary | ICD-10-CM

## 2013-06-16 DIAGNOSIS — R011 Cardiac murmur, unspecified: Secondary | ICD-10-CM

## 2013-06-16 NOTE — Progress Notes (Signed)
Echocardiogram performed.  

## 2013-07-07 ENCOUNTER — Telehealth: Payer: Self-pay | Admitting: Internal Medicine

## 2013-07-07 NOTE — Telephone Encounter (Signed)
New message ° ° ° ° ° ° ° ° ° °Pt returning nurses call °

## 2013-07-07 NOTE — Telephone Encounter (Signed)
Spoke with patient's husband  Aware of echo results

## 2013-08-03 ENCOUNTER — Telehealth: Payer: Self-pay | Admitting: Internal Medicine

## 2013-08-03 NOTE — Telephone Encounter (Signed)
F/u    Pt waiting on call from nurse concerning previous message.

## 2013-08-03 NOTE — Telephone Encounter (Signed)
New message   Dr. Danielle Dess office want to take patient off coumadin for 5 days due to upcoming back surgery. Husband has some questions concerning this request.

## 2013-08-03 NOTE — Telephone Encounter (Signed)
Dr Johney Frame will be here tomorrow to address

## 2013-08-04 ENCOUNTER — Telehealth: Payer: Self-pay | Admitting: Internal Medicine

## 2013-08-04 ENCOUNTER — Other Ambulatory Visit: Payer: Self-pay | Admitting: Neurological Surgery

## 2013-08-04 NOTE — Telephone Encounter (Signed)
Form was faxed over today after Dr Johney Frame signed.  Her surgery is scheduled for Mon at 5:30am.  Virgina Evener follows her Coumadin and has recommended Lovenox.  She has never had a stroke and Dr Johney Frame said ok to stop Coumadin without Lovenox bridge but she is going to do the Lovenox per Brian's recomendation

## 2013-08-04 NOTE — Telephone Encounter (Signed)
Follow up    Office calling to address to Lovenox bridges .

## 2013-08-04 NOTE — Telephone Encounter (Signed)
Received request from Nurse fax box, documents faxed for surgical clearance. To: [facility] Fax number: [phone] Attention: 2.4.15/kdm

## 2013-08-06 ENCOUNTER — Encounter (HOSPITAL_COMMUNITY): Payer: Self-pay

## 2013-08-06 ENCOUNTER — Encounter (HOSPITAL_COMMUNITY)
Admission: RE | Admit: 2013-08-06 | Discharge: 2013-08-06 | Disposition: A | Discharge status: Home / Self Care | Payer: Medicare Other | Source: Ambulatory Visit | Attending: Neurological Surgery | Admitting: Neurological Surgery

## 2013-08-06 ENCOUNTER — Encounter (HOSPITAL_COMMUNITY): Payer: Self-pay | Admitting: Pharmacy Technician

## 2013-08-06 ENCOUNTER — Ambulatory Visit (HOSPITAL_COMMUNITY)
Admission: RE | Admit: 2013-08-06 | Discharge: 2013-08-06 | Disposition: A | Discharge status: Home / Self Care | Payer: Medicare Other | Source: Ambulatory Visit | Attending: Neurological Surgery | Admitting: Neurological Surgery

## 2013-08-06 DIAGNOSIS — M8448XA Pathological fracture, other site, initial encounter for fracture: Secondary | ICD-10-CM | POA: Insufficient documentation

## 2013-08-06 DIAGNOSIS — J4489 Other specified chronic obstructive pulmonary disease: Secondary | ICD-10-CM | POA: Insufficient documentation

## 2013-08-06 DIAGNOSIS — Z950 Presence of cardiac pacemaker: Secondary | ICD-10-CM | POA: Insufficient documentation

## 2013-08-06 DIAGNOSIS — R911 Solitary pulmonary nodule: Secondary | ICD-10-CM | POA: Insufficient documentation

## 2013-08-06 DIAGNOSIS — I10 Essential (primary) hypertension: Secondary | ICD-10-CM | POA: Insufficient documentation

## 2013-08-06 DIAGNOSIS — I447 Left bundle-branch block, unspecified: Secondary | ICD-10-CM | POA: Insufficient documentation

## 2013-08-06 DIAGNOSIS — I495 Sick sinus syndrome: Secondary | ICD-10-CM | POA: Insufficient documentation

## 2013-08-06 DIAGNOSIS — J449 Chronic obstructive pulmonary disease, unspecified: Secondary | ICD-10-CM | POA: Insufficient documentation

## 2013-08-06 HISTORY — DX: Personal history of other diseases of the digestive system: Z87.19

## 2013-08-06 LAB — BASIC METABOLIC PANEL
BUN: 8 mg/dL (ref 6–23)
CALCIUM: 9.9 mg/dL (ref 8.4–10.5)
CO2: 28 mEq/L (ref 19–32)
Chloride: 101 mEq/L (ref 96–112)
Creatinine, Ser: 0.51 mg/dL (ref 0.50–1.10)
GFR calc Af Amer: >90 mL/min (ref >90)
GFR, EST NON AFRICAN AMERICAN: 88 mL/min — AB (ref >90)
Glucose, Bld: 89 mg/dL (ref 70–99)
Potassium: 4.4 mEq/L (ref 3.7–5.3)
SODIUM: 140 meq/L (ref 137–147)

## 2013-08-06 LAB — CBC
HCT: 36.3 % (ref 36.0–46.0)
Hemoglobin: 12.6 g/dL (ref 12.0–15.0)
MCH: 32.1 pg (ref 26.0–34.0)
MCHC: 34.7 g/dL (ref 30.0–36.0)
MCV: 92.4 fL (ref 78.0–100.0)
PLATELETS: 185 10*3/uL (ref 150–400)
RBC: 3.93 MIL/uL (ref 3.87–5.11)
RDW: 13.2 % (ref 11.5–15.5)
WBC: 5.1 10*3/uL (ref 4.0–10.5)

## 2013-08-06 LAB — APTT: aPTT: 36 seconds (ref 24–37)

## 2013-08-06 LAB — PROTIME-INR
INR: 1.41 (ref 0.00–1.49)
PROTHROMBIN TIME: 16.9 s — AB (ref 11.6–15.2)

## 2013-08-06 LAB — SURGICAL PCR SCREEN
MRSA, PCR: NEGATIVE
Staphylococcus aureus: NEGATIVE

## 2013-08-06 NOTE — Progress Notes (Addendum)
ERROR ON NOTE RE; PLAVIX AND ASA(WRONG PATIENT).

## 2013-08-06 NOTE — Progress Notes (Signed)
Anesthesia Note:  Chart given to me after 5 PM on Friday for surgery on Monday morning 08/09/13, L2 kyphoplasty by Dr. Ellin Goodie.  Notes indicate that her cardiologist Dr. Johney Frame is aware of plans for surgery and gave permission to stop Coumadin without Lovenox bridge--however patient is doing Lovenox bridge per Arlys John Bray's recommendation (apparently he normally follows her Coumadin).  She had a dual chamber PPM placed for tachy-brady syndrome and 2nd degree AVG type II on 11/13/11. She also has known left BBB. Today's labs and CXR noted.  Repeat PT/PTT on arrival.  Further review and evaluation by her assigned anesthesiologist on Monday.  Velna Ochs [facility] Short Stay Center/Anesthesiology Phone [phone] 08/06/2013 5:20 PM

## 2013-08-06 NOTE — Pre-Procedure Instructions (Signed)
Rhonda [name]  08/06/2013   Your procedure is scheduled on:    Monday  08/09/13  Report to [facility] Short Stay [facility]  2 * 3 at 530 AM.  Call this number if you have problems the morning of surgery: [phone]   Remember:   Do not eat food or drink liquids after midnight.   Take these medicines the morning of surgery with A SIP OF WATER: DILTIZEM, EYE DROPS, PANTOPRAZOLE, TRAMADOL  (STOP ASPIRIN, COUMADIN, PLAVIX, EFFIENT, HERBAL MEDICINES)   Do not wear jewelry, make-up or nail polish.  Do not wear lotions, powders, or perfumes. You may wear deodorant.  Do not shave 48 hours prior to surgery. Men may shave face and neck.  Do not bring valuables to the hospital.  [facility] is not responsible                  for any belongings or valuables.               Contacts, dentures or bridgework may not be worn into surgery.  Leave suitcase in the car. After surgery it may be brought to your room.  For patients admitted to the hospital, discharge time is determined by your                treatment team.               Patients discharged the day of surgery will not be allowed to drive  home.  Name and phone number of your driver:   Special Instructions:  Special Instructions: [place] - Preparing for Surgery  Before surgery, you can play an important role.  Because skin is not sterile, your skin needs to be as free of germs as possible.  You can reduce the number of germs on you skin by washing with CHG (chlorahexidine gluconate) soap before surgery.  CHG is an antiseptic cleaner which kills germs and bonds with the skin to continue killing germs even after washing.  Please DO NOT use if you have an allergy to CHG or antibacterial soaps.  If your skin becomes reddened/irritated stop using the CHG and inform your nurse when you arrive at Short Stay.  Do not shave (including legs and underarms) for at least 48 hours prior to the first CHG shower.  You may shave your face.  Please  follow these instructions carefully:   1.  Shower with CHG Soap the night before surgery and the morning of Surgery.  2.  If you choose to wash your hair, wash your hair first as usual with your normal shampoo.  3.  After you shampoo, rinse your hair and body thoroughly to remove the Shampoo.  4.  Use CHG as you would any other liquid soap. You can apply chg directly to the skin and wash gently with scrungie or a clean washcloth.  5.  Apply the CHG Soap to your body ONLY FROM THE NECK DOWN.  Do not use on open wounds or open sores.  Avoid contact with your eyes, ears, mouth and genitals (private parts).  Wash genitals (private parts with your normal soap.  6.  Wash thoroughly, paying special attention to the area where your surgery will be performed.  7.  Thoroughly rinse your body with warm water from the neck down.  8.  DO NOT shower/wash with your normal soap after using and rinsing off the CHG Soap.  9.  Pat yourself dry with a clean towel.  10.  Wear clean pajamas.            11.  Place clean sheets on your bed the night of your first shower and do not sleep with pets.  Day of Surgery  Do not apply any lotions/deodorants the morning of surgery.  Please wear clean clothes to the hospital/surgery center.   Please read over the following fact sheets that you were given: Pain Booklet, Coughing and Deep Breathing, MRSA Information and Surgical Site Infection Prevention

## 2013-08-08 MED ORDER — CEFAZOLIN SODIUM-DEXTROSE 2-3 GM-% IV SOLR: 2.0000 g | INTRAVENOUS | Status: DC

## 2013-08-08 NOTE — H&P (Signed)
CHIEF COMPLAINT:                                          Back pain.  HISTORY OF PRESENT ILLNESS:                     Ms. Rhonda [name] is an 80 year old individual who has developed back pain on 07/01/2013.  At that time, she was bending and twisting and suddenly felt a pain that started to become progressively worse.  She was seen by Dr. Nicki Reaper.  X-rays demonstrated what was thought to be an old L2 fracture.  When she did not improve, her repeat radiographs demonstrated that there was progression of the fracture and suggested that this was acute or subacute.  She is referred here for further evaluation.  She was also seen by Dr. [name] and she did have some pain medication, but she notes that despite the pain medication and passage of time, she continues to be in considerable pain.  REVIEW OF SYSTEMS:                                    Notable for wearing of glasses, glaucoma, high blood pressure, irregular pulse, heart murmur, high cholesterol, back pain and leg pain on a 14-point review sheet noted in the office today.  PAST MEDICAL HISTORY:    Current Medical Conditions:  Significant for hypertension.  She has a history of atrial fibrillation, has a pacemaker in place, and also is on Coumadin.    Prior Operations:  Previous surgeries include angioplasty in 1993 and then coronary ablation in 2013.    Medications and Allergies:  SHE NOTES AN ALLERGY TO SULFA.  Her current medication list includes pantoprazole, atorvastatin, hydralazine, warfarin, Brimonidine tartrate, dorzolamide solution for the eyes, Travatan, another eye drop, calcium, multivitamins, vitamin D3, Tylenol, and tramadol.  SOCIAL HISTORY:                                            Personal history reveals that she does not smoke or use alcohol.  PHYSICAL EXAMINATION:                                Height and weight have been stable of 5 feet 6-1/2 inches and 124 pounds.  On physical examination, she stands with a  slight forward hunch.  Palpation of her back reproduces significant pain in the midportion of the lumbar spine.  Percussion also reproduces substantial pain.  Her motor strength appears intact in the iliopsoas, quads, tibialis anterior, and gastrocs.  Deep tendon reflexes are 2+ in the biceps and triceps, trace in the patellae, and absent in the Achilles.  Babinski's are downgoing.  IMPRESSION/PLAN:                                         The patient has evidence of an L2 fracture that is now subacute.  She has had terrible pain and this has not been relieved with conservative measures.  She has  been advised regarding the need for kyphoplasty.  I discussed the risks and benefits of the procedure with her.  Because of her Coumadinization, she will need to be relieved of this.  We will work together, scheduled at the earliest convenience, possibly this coming Tuesday.  I  discussed the major risks and benefits of the procedure with her.  She is eager to proceed.

## 2013-08-09 ENCOUNTER — Encounter (HOSPITAL_COMMUNITY)
Admission: RE | Disposition: A | Discharge status: Home / Self Care | Payer: Self-pay | Source: Ambulatory Visit | Attending: Neurological Surgery

## 2013-08-09 ENCOUNTER — Encounter (HOSPITAL_COMMUNITY): Payer: Self-pay | Admitting: *Deleted

## 2013-08-09 ENCOUNTER — Ambulatory Visit (HOSPITAL_COMMUNITY)
Admission: RE | Admit: 2013-08-09 | Discharge: 2013-08-09 | Disposition: A | Discharge status: Home / Self Care | Payer: Medicare Other | Source: Ambulatory Visit | Attending: Neurological Surgery | Admitting: Neurological Surgery

## 2013-08-09 ENCOUNTER — Ambulatory Visit (HOSPITAL_COMMUNITY): Payer: Medicare Other

## 2013-08-09 ENCOUNTER — Encounter (HOSPITAL_COMMUNITY): Payer: Medicare Other | Admitting: Vascular Surgery

## 2013-08-09 ENCOUNTER — Ambulatory Visit (HOSPITAL_COMMUNITY): Payer: Medicare Other | Admitting: Anesthesiology

## 2013-08-09 DIAGNOSIS — I10 Essential (primary) hypertension: Secondary | ICD-10-CM | POA: Diagnosis not present

## 2013-08-09 DIAGNOSIS — M810 Age-related osteoporosis without current pathological fracture: Secondary | ICD-10-CM | POA: Insufficient documentation

## 2013-08-09 DIAGNOSIS — S32029A Unspecified fracture of second lumbar vertebra, initial encounter for closed fracture: Secondary | ICD-10-CM | POA: Diagnosis present

## 2013-08-09 DIAGNOSIS — I4891 Unspecified atrial fibrillation: Secondary | ICD-10-CM | POA: Insufficient documentation

## 2013-08-09 DIAGNOSIS — K219 Gastro-esophageal reflux disease without esophagitis: Secondary | ICD-10-CM | POA: Insufficient documentation

## 2013-08-09 DIAGNOSIS — H409 Unspecified glaucoma: Secondary | ICD-10-CM | POA: Diagnosis not present

## 2013-08-09 DIAGNOSIS — M8448XA Pathological fracture, other site, initial encounter for fracture: Secondary | ICD-10-CM | POA: Insufficient documentation

## 2013-08-09 DIAGNOSIS — Z7901 Long term (current) use of anticoagulants: Secondary | ICD-10-CM | POA: Insufficient documentation

## 2013-08-09 DIAGNOSIS — K449 Diaphragmatic hernia without obstruction or gangrene: Secondary | ICD-10-CM | POA: Insufficient documentation

## 2013-08-09 DIAGNOSIS — I2510 Atherosclerotic heart disease of native coronary artery without angina pectoris: Secondary | ICD-10-CM | POA: Insufficient documentation

## 2013-08-09 DIAGNOSIS — Z950 Presence of cardiac pacemaker: Secondary | ICD-10-CM | POA: Insufficient documentation

## 2013-08-09 DIAGNOSIS — E7800 Pure hypercholesterolemia, unspecified: Secondary | ICD-10-CM | POA: Insufficient documentation

## 2013-08-09 HISTORY — PX: KYPHOPLASTY: SHX5884

## 2013-08-09 LAB — PROTIME-INR
INR: 1.02 (ref 0.00–1.49)
Prothrombin Time: 13.2 seconds (ref 11.6–15.2)

## 2013-08-09 LAB — APTT: aPTT: 29 seconds (ref 24–37)

## 2013-08-09 SURGERY — KYPHOPLASTY
Anesthesia: General

## 2013-08-09 MED ORDER — ROCURONIUM BROMIDE 100 MG/10ML IV SOLN
INTRAVENOUS | Status: DC | PRN
  Administered 2013-08-09: 25 mg via INTRAVENOUS

## 2013-08-09 MED ORDER — LACTATED RINGERS IV SOLN
INTRAVENOUS | Status: DC | PRN
  Administered 2013-08-09: 07:00:00 via INTRAVENOUS

## 2013-08-09 MED ORDER — ONDANSETRON HCL 4 MG/2ML IJ SOLN: 4.0000 mg | INTRAMUSCULAR | Status: DC | PRN

## 2013-08-09 MED ORDER — PHENYLEPHRINE 40 MCG/ML (10ML) SYRINGE FOR IV PUSH (FOR BLOOD PRESSURE SUPPORT)
PREFILLED_SYRINGE | INTRAVENOUS | Status: AC
  Filled 2013-08-09: qty 10

## 2013-08-09 MED ORDER — EPHEDRINE SULFATE 50 MG/ML IJ SOLN
INTRAMUSCULAR | Status: AC
  Filled 2013-08-09: qty 1

## 2013-08-09 MED ORDER — SUCCINYLCHOLINE CHLORIDE 20 MG/ML IJ SOLN
INTRAMUSCULAR | Status: AC
  Filled 2013-08-09: qty 1

## 2013-08-09 MED ORDER — ACETAMINOPHEN 650 MG RE SUPP: 650.0000 mg | RECTAL | Status: DC | PRN

## 2013-08-09 MED ORDER — SODIUM CHLORIDE 0.9 % IJ SOLN: 3.0000 mL | INTRAMUSCULAR | Status: DC | PRN

## 2013-08-09 MED ORDER — LIDOCAINE-EPINEPHRINE 1 %-1:100000 IJ SOLN
INTRAMUSCULAR | Status: DC | PRN
  Administered 2013-08-09: 2 mL

## 2013-08-09 MED ORDER — CEFAZOLIN SODIUM 1-5 GM-% IV SOLN
1.0000 g | Freq: Three times a day (TID) | INTRAVENOUS | Status: DC
  Administered 2013-08-09: 1 g via INTRAVENOUS
  Filled 2013-08-09 (×2): qty 50

## 2013-08-09 MED ORDER — MENTHOL 3 MG MT LOZG: 1.0000 | LOZENGE | OROMUCOSAL | Status: DC | PRN

## 2013-08-09 MED ORDER — FENTANYL CITRATE 0.05 MG/ML IJ SOLN
INTRAMUSCULAR | Status: AC
  Filled 2013-08-09: qty 5

## 2013-08-09 MED ORDER — SODIUM CHLORIDE 0.9 % IJ SOLN
INTRAMUSCULAR | Status: AC
  Filled 2013-08-09: qty 10

## 2013-08-09 MED ORDER — MORPHINE SULFATE 2 MG/ML IJ SOLN: 1.0000 mg | INTRAMUSCULAR | Status: DC | PRN

## 2013-08-09 MED ORDER — NEOSTIGMINE METHYLSULFATE 1 MG/ML IJ SOLN
INTRAMUSCULAR | Status: AC
  Filled 2013-08-09: qty 10

## 2013-08-09 MED ORDER — ROCURONIUM BROMIDE 50 MG/5ML IV SOLN
INTRAVENOUS | Status: AC
  Filled 2013-08-09: qty 1

## 2013-08-09 MED ORDER — OXYCODONE-ACETAMINOPHEN 5-325 MG PO TABS: 1.0000 | ORAL_TABLET | ORAL | Status: DC | PRN

## 2013-08-09 MED ORDER — FENTANYL CITRATE 0.05 MG/ML IJ SOLN
INTRAMUSCULAR | Status: AC
  Filled 2013-08-09: qty 2

## 2013-08-09 MED ORDER — ACETAMINOPHEN 325 MG PO TABS: 650.0000 mg | ORAL_TABLET | ORAL | Status: DC | PRN

## 2013-08-09 MED ORDER — LIDOCAINE HCL (CARDIAC) 20 MG/ML IV SOLN
INTRAVENOUS | Status: AC
  Filled 2013-08-09: qty 5

## 2013-08-09 MED ORDER — FENTANYL CITRATE 0.05 MG/ML IJ SOLN
25.0000 ug | INTRAMUSCULAR | Status: DC | PRN
  Administered 2013-08-09: 50 ug via INTRAVENOUS
  Administered 2013-08-09 (×2): 25 ug via INTRAVENOUS

## 2013-08-09 MED ORDER — PHENOL 1.4 % MT LIQD: 1.0000 | OROMUCOSAL | Status: DC | PRN

## 2013-08-09 MED ORDER — NEOSTIGMINE METHYLSULFATE 1 MG/ML IJ SOLN
INTRAMUSCULAR | Status: DC | PRN
Start: 2013-08-09 — End: 2013-08-09
  Administered 2013-08-09: 3 mg via INTRAVENOUS

## 2013-08-09 MED ORDER — PROPOFOL 10 MG/ML IV BOLUS
INTRAVENOUS | Status: AC
  Filled 2013-08-09: qty 20

## 2013-08-09 MED ORDER — LIDOCAINE HCL (CARDIAC) 20 MG/ML IV SOLN
INTRAVENOUS | Status: DC | PRN
  Administered 2013-08-09: 40 mg via INTRAVENOUS

## 2013-08-09 MED ORDER — SODIUM CHLORIDE 0.9 % IJ SOLN: 3.0000 mL | Freq: Two times a day (BID) | INTRAMUSCULAR | Status: DC

## 2013-08-09 MED ORDER — FENTANYL CITRATE 0.05 MG/ML IJ SOLN
INTRAMUSCULAR | Status: DC | PRN
  Administered 2013-08-09: 50 ug via INTRAVENOUS

## 2013-08-09 MED ORDER — GLYCOPYRROLATE 0.2 MG/ML IJ SOLN
INTRAMUSCULAR | Status: AC
  Filled 2013-08-09: qty 2

## 2013-08-09 MED ORDER — KETOROLAC TROMETHAMINE 30 MG/ML IJ SOLN
INTRAMUSCULAR | Status: AC
  Filled 2013-08-09: qty 1

## 2013-08-09 MED ORDER — IOHEXOL 300 MG/ML  SOLN
INTRAMUSCULAR | Status: DC | PRN
  Administered 2013-08-09: 50 mL

## 2013-08-09 MED ORDER — GLYCOPYRROLATE 0.2 MG/ML IJ SOLN
INTRAMUSCULAR | Status: DC | PRN
  Administered 2013-08-09: .4 mg via INTRAVENOUS

## 2013-08-09 MED ORDER — PROPOFOL 10 MG/ML IV BOLUS
INTRAVENOUS | Status: DC | PRN
  Administered 2013-08-09: 75 mg via INTRAVENOUS

## 2013-08-09 MED ORDER — BUPIVACAINE HCL (PF) 0.5 % IJ SOLN
INTRAMUSCULAR | Status: DC | PRN
  Administered 2013-08-09: 2 mL

## 2013-08-09 MED ORDER — KETOROLAC TROMETHAMINE 15 MG/ML IJ SOLN
15.0000 mg | Freq: Four times a day (QID) | INTRAMUSCULAR | Status: DC
Start: 2013-08-09 — End: 2013-08-09
  Administered 2013-08-09 (×2): 15 mg via INTRAVENOUS
  Filled 2013-08-09 (×3): qty 1

## 2013-08-09 MED ORDER — ONDANSETRON HCL 4 MG/2ML IJ SOLN
INTRAMUSCULAR | Status: AC
  Filled 2013-08-09: qty 2

## 2013-08-09 MED ORDER — SODIUM CHLORIDE 0.9 % IV SOLN: 250.0000 mL | INTRAVENOUS | Status: DC

## 2013-08-09 MED ORDER — ARTIFICIAL TEARS OP OINT
TOPICAL_OINTMENT | OPHTHALMIC | Status: AC
  Filled 2013-08-09: qty 3.5

## 2013-08-09 SURGICAL SUPPLY — 44 items
BANDAGE ADHESIVE 1X3 (GAUZE/BANDAGES/DRESSINGS) ×8 IMPLANT
BLADE SURG 11 STRL SS (BLADE) ×2 IMPLANT
BLADE SURG ROTATE 9660 (MISCELLANEOUS) IMPLANT
CEMENT BONE KYPHX HV R (Orthopedic Implant) ×2 IMPLANT
CEMENT KYPHON C01A KIT/MIXER (Cement) ×2 IMPLANT
CONT SPEC 4OZ CLIKSEAL STRL BL (MISCELLANEOUS) ×4 IMPLANT
DECANTER SPIKE VIAL GLASS SM (MISCELLANEOUS) ×2 IMPLANT
DERMABOND ADHESIVE PROPEN (GAUZE/BANDAGES/DRESSINGS) ×1
DERMABOND ADVANCED (GAUZE/BANDAGES/DRESSINGS)
DERMABOND ADVANCED .7 DNX12 (GAUZE/BANDAGES/DRESSINGS) IMPLANT
DERMABOND ADVANCED .7 DNX6 (GAUZE/BANDAGES/DRESSINGS) ×1 IMPLANT
DRAPE C-ARM 42X72 X-RAY (DRAPES) ×2 IMPLANT
DRAPE INCISE IOBAN 66X45 STRL (DRAPES) ×2 IMPLANT
DRAPE LAPAROTOMY 100X72X124 (DRAPES) ×2 IMPLANT
DRAPE PROXIMA HALF (DRAPES) ×2 IMPLANT
DURAPREP 26ML APPLICATOR (WOUND CARE) ×2 IMPLANT
GAUZE SPONGE 4X4 16PLY XRAY LF (GAUZE/BANDAGES/DRESSINGS) ×2 IMPLANT
GLOVE BIO SURGEON STRL SZ7.5 (GLOVE) IMPLANT
GLOVE BIOGEL PI IND STRL 7.5 (GLOVE) IMPLANT
GLOVE BIOGEL PI IND STRL 8.5 (GLOVE) ×1 IMPLANT
GLOVE BIOGEL PI INDICATOR 7.5 (GLOVE)
GLOVE BIOGEL PI INDICATOR 8.5 (GLOVE) ×1
GLOVE ECLIPSE 8.5 STRL (GLOVE) ×2 IMPLANT
GLOVE EXAM NITRILE LRG STRL (GLOVE) IMPLANT
GLOVE EXAM NITRILE MD LF STRL (GLOVE) IMPLANT
GLOVE EXAM NITRILE XL STR (GLOVE) IMPLANT
GLOVE EXAM NITRILE XS STR PU (GLOVE) IMPLANT
GOWN BRE IMP SLV AUR LG STRL (GOWN DISPOSABLE) IMPLANT
GOWN BRE IMP SLV AUR XL STRL (GOWN DISPOSABLE) ×2 IMPLANT
GOWN STRL REIN 2XL LVL4 (GOWN DISPOSABLE) ×2 IMPLANT
KIT BASIN OR (CUSTOM PROCEDURE TRAY) ×2 IMPLANT
KIT ROOM TURNOVER OR (KITS) ×2 IMPLANT
NEEDLE HYPO 25X1 1.5 SAFETY (NEEDLE) ×2 IMPLANT
NS IRRIG 1000ML POUR BTL (IV SOLUTION) ×2 IMPLANT
PACK SURGICAL SETUP 50X90 (CUSTOM PROCEDURE TRAY) ×2 IMPLANT
PAD ARMBOARD 7.5X6 YLW CONV (MISCELLANEOUS) ×6 IMPLANT
SPECIMEN JAR SMALL (MISCELLANEOUS) IMPLANT
SUT VIC AB 3-0 SH 8-18 (SUTURE) ×2 IMPLANT
SUT VIC AB 4-0 P-3 18X BRD (SUTURE) ×1 IMPLANT
SUT VIC AB 4-0 P3 18 (SUTURE) ×1
SYR CONTROL 10ML LL (SYRINGE) ×4 IMPLANT
TOWEL OR 17X24 6PK STRL BLUE (TOWEL DISPOSABLE) ×2 IMPLANT
TOWEL OR 17X26 10 PK STRL BLUE (TOWEL DISPOSABLE) ×2 IMPLANT
TRAY KYPHOPAK 15/3 ONESTEP 1ST (MISCELLANEOUS) ×2 IMPLANT

## 2013-08-09 NOTE — Progress Notes (Signed)
[employer] Rep. Contacted regarding pacemaker. Will be here by approx [phone].  [phone]

## 2013-08-09 NOTE — Preoperative (Signed)
Beta Blockers   Reason not to administer Beta Blockers:Not Applicable 

## 2013-08-09 NOTE — Discharge Summary (Signed)
Physician Discharge Summary  Patient ID: Rhonda [name] MRN: [id number] DOB/AGE: [id number] 78 y.o.  Admit date: 08/09/2013 Discharge date: 08/09/2013  Admission Diagnoses: L2 pathologic compression fracture,  Discharge Diagnoses: O2 pathologic compression fracture, osteoporosis  Active Problems:   L2 vertebral fracture   Discharged Condition: good  Hospital Course: Patient was admitted to undergo acrylic balloon kyphoplasty of L2. She tolerated procedure well  Consults: None  Significant Diagnostic Studies: None  Treatments: surgery: L2 acrylic balloon kyphoplasty  Discharge Exam: Blood pressure 153/68, pulse 64, temperature 98.5 F (36.9 C), temperature source Oral, resp. rate 18, SpO2 94.00%. Motor function is intact ambulatory status intact  Disposition: 01-Home or Self Care  Discharge Orders   Future Appointments Provider Department Dept Phone   08/31/2013 8:10 AM [facility] Office [phone]   Future Orders Complete By Expires   Call MD for:  redness, tenderness, or signs of infection (pain, swelling, redness, odor or green/yellow discharge around incision site)  As directed    Call MD for:  severe uncontrolled pain  As directed    Call MD for:  temperature >100.4  As directed    Diet - low sodium heart healthy  As directed    Discharge instructions  As directed    Comments:     Okay to shower. Do not apply salves or appointments to incision. No heavy lifting with the upper extremities greater than 15 pounds. May resume driving when not requiring pain medication and patient feels comfortable with doing so.   Increase activity slowly  As directed        Medication List         acetaminophen 500 MG tablet  Commonly known as:  TYLENOL  Take 500 mg by mouth daily as needed for mild pain.     ALPHAGAN P 0.15 % ophthalmic solution  Generic drug:  brimonidine  Place 1 drop into the right eye 3 (three) times daily.     amLODipine-olmesartan 5-40 MG per tablet  Commonly known as:  AZOR  Take 1 tablet by mouth daily.     atorvastatin 40 MG tablet  Commonly known as:  LIPITOR  Take 40 mg by mouth daily.     diltiazem 30 MG tablet  Commonly known as:  CARDIZEM  Take 1 tablet (30 mg total) by mouth 4 (four) times daily.     dorzolamide 2 % ophthalmic solution  Commonly known as:  TRUSOPT  Place 1 drop into the right eye 2 (two) times daily.     enoxaparin 40 MG/0.4ML injection  Commonly known as:  LOVENOX  Inject 40 mg into the skin daily.     hydrALAZINE 50 MG tablet  Commonly known as:  APRESOLINE  Take 50 mg by mouth 2 (two) times daily.     INTEGRA 62.5-62.5-40-3 MG Caps  Take 1 capsule by mouth daily.     multivitamin with minerals Tabs tablet  Take 1 tablet by mouth daily.     nitroGLYCERIN 0.4 MG SL tablet  Commonly known as:  NITROSTAT  Place 0.4 mg under the tongue every 5 (five) minutes as needed. For chest pain.     OVER THE COUNTER MEDICATION  Take 1 tablet by mouth 3 (three) times daily. Calcium Magnesium Zinc Copper and vitamin d3     pantoprazole 40 MG tablet  Commonly known as:  PROTONIX  Take 40 mg by mouth daily.     PATADAY 0.2 % Soln  Generic drug:  Olopatadine HCl  Place 1 drop into both eyes daily.     SYSTANE OP  Place 1 drop into both eyes daily as needed (for dry eyes). For moisture.     traMADol 50 MG tablet  Commonly known as:  ULTRAM  Take 50 mg by mouth 3 (three) times daily.     TRAVATAN Z OP  Place 1 drop into the right eye at bedtime. Right Eye only     Vitamin D3 1000 UNITS Caps  Take 1 capsule by mouth daily.     warfarin 5 MG tablet  Commonly known as:  COUMADIN  Take as directed by coumadin clinic         Signed: [name] 08/09/2013, 1:04 PM

## 2013-08-09 NOTE — Op Note (Signed)
Date of surgery: 08/09/2013 Preoperative diagnosis: L2 pathologic compression fracture Postoperative diagnosis: L2 pathologic compression fracture osteoporosis Procedure: L2 acrylic balloon kyphoplasty Surgeon: Barnett Abu Anesthesia: General endotracheal Indications: Rhonda [name] is an 80 year old individual who had sustained a spontaneous compression fracture the L2 vertebrae. X-rays and CAT scan demonstrate progressive compression of L2 vertebral body. She's been advised regarding the need for acrylic kyphoplasty to help control her pain which has been unrelenting for the past month and a half.  Procedure: The patient was brought to the operating room supine on a stretcher. After the smooth induction of general endotracheal anesthesia, she was turned prone. The back was prepped with alcohol and DuraPrep. Fluoroscopic guidance was used to localize the L2 vertebrae on the left side in the AP and lateral projections. Then by choosing an entry site in the left lateral position at the 9:00 position from the pedicle the skin was infiltrated with 1 cc of lidocaine without epinephrine and a stab incision was made with 11 blade. A Jamshidi needle was inserted through this incision and directed towards the lateral inferior aspect of the pedicle of L3. The pedicle was entered and a bone was noted to be extremely soft. When the tip of the needle was in the vertebral body from the lateral projection carefully avoiding the spinal canal the inner cannula was removed. A bone drill was placed into the vertebral body. The contents were extremely soft and very mushy. The needle was withdrawn and a balloon was inserted into the cannula. It was inflated to a total of 7 cc. This restored some of the height of the vertebral body. Cement was mixed to the appropriate consistency. Then 1 cannula after another of the cement was inserted into the vertebral body for a total of 7-1/2 cc. At this point filling of the vertebral body  was good reached across the midline and fill the anterior two thirds of the vertebral body. The procedure was stopped at this point. Jamshidi was removed carefully under fluoroscopic guidance to make sure no details of cement followed. Final radiographs were obtained. The skin was closed with a single inverted 3-0 Vicryl suture. Dermabond was placed on the skin. Blood loss nil.

## 2013-08-09 NOTE — Anesthesia Postprocedure Evaluation (Signed)
  Anesthesia Post-op Note  Patient: Rhonda [name]  Procedure(s) Performed: Procedure(s) with comments: KYPHOPLASTY (N/A) - L2 Kyphoplasty  Patient Location: PACU  Anesthesia Type:General  Level of Consciousness: awake  Airway and Oxygen Therapy: Patient Spontanous Breathing  Post-op Pain: mild  Post-op Assessment: Post-op Vital signs reviewed  Post-op Vital Signs: Reviewed  Complications: No apparent anesthesia complications

## 2013-08-09 NOTE — Transfer of Care (Signed)
Immediate Anesthesia Transfer of Care Note  Patient: Rhonda [name]  Procedure(s) Performed: Procedure(s) with comments: KYPHOPLASTY (N/A) - L2 Kyphoplasty  Patient Location: PACU  Anesthesia Type:General  Level of Consciousness: awake, alert  and oriented  Airway & Oxygen Therapy: Patient Spontanous Breathing and Patient connected to nasal cannula oxygen  Post-op Assessment: Report given to PACU RN and Post -op Vital signs reviewed and stable  Post vital signs: Reviewed and stable  Complications: No apparent anesthesia complications

## 2013-08-09 NOTE — Anesthesia Preprocedure Evaluation (Addendum)
Anesthesia Evaluation  Patient identified by MRN, date of birth, ID band Patient awake    Reviewed: Allergy & Precautions, H&P , NPO status , Patient's Chart, lab work & pertinent test results  History of Anesthesia Complications (+) PONV  Airway Mallampati: II      Dental   Pulmonary neg pulmonary ROS,  breath sounds clear to auscultation        Cardiovascular hypertension, + CAD + dysrhythmias + pacemaker Rhythm:Regular Rate:Normal     Neuro/Psych    GI/Hepatic Neg liver ROS, hiatal hernia, GERD-  ,  Endo/Other    Renal/GU negative Renal ROS     Musculoskeletal   Abdominal   Peds  Hematology   Anesthesia Other Findings   Reproductive/Obstetrics                         Anesthesia Physical Anesthesia Plan  ASA: III  Anesthesia Plan: General   Post-op Pain Management:    Induction: Intravenous  Airway Management Planned: Oral ETT  Additional Equipment:   Intra-op Plan:   Post-operative Plan:   Informed Consent: I have reviewed the patients History and Physical, chart, labs and discussed the procedure including the risks, benefits and alternatives for the proposed anesthesia with the patient or authorized representative who has indicated his/her understanding and acceptance.   Dental advisory given  Plan Discussed with: CRNA, Anesthesiologist and Surgeon  Anesthesia Plan Comments:         Anesthesia Quick Evaluation

## 2013-08-09 NOTE — Progress Notes (Signed)
Pt doing well. Pt and family given D/C instructions with verbal understanding. Pt D/C'd home via wheelchair @ 1410 per MD order. Pt is stable @ D/C and has no there needs. Rema Fendt, RN

## 2013-08-12 ENCOUNTER — Encounter (HOSPITAL_COMMUNITY): Payer: Self-pay | Admitting: Neurological Surgery

## 2013-08-28 ENCOUNTER — Emergency Department (HOSPITAL_COMMUNITY): Payer: Medicare Other

## 2013-08-28 ENCOUNTER — Emergency Department (HOSPITAL_COMMUNITY)
Admission: EM | Admit: 2013-08-28 | Discharge: 2013-08-28 | Disposition: A | Discharge status: Home / Self Care | Payer: Medicare Other | Attending: Emergency Medicine | Admitting: Emergency Medicine

## 2013-08-28 ENCOUNTER — Encounter (HOSPITAL_COMMUNITY): Payer: Self-pay | Admitting: Emergency Medicine

## 2013-08-28 DIAGNOSIS — I10 Essential (primary) hypertension: Secondary | ICD-10-CM | POA: Insufficient documentation

## 2013-08-28 DIAGNOSIS — Z862 Personal history of diseases of the blood and blood-forming organs and certain disorders involving the immune mechanism: Secondary | ICD-10-CM | POA: Insufficient documentation

## 2013-08-28 DIAGNOSIS — I2510 Atherosclerotic heart disease of native coronary artery without angina pectoris: Secondary | ICD-10-CM | POA: Insufficient documentation

## 2013-08-28 DIAGNOSIS — Z9889 Other specified postprocedural states: Secondary | ICD-10-CM | POA: Insufficient documentation

## 2013-08-28 DIAGNOSIS — Z79899 Other long term (current) drug therapy: Secondary | ICD-10-CM | POA: Insufficient documentation

## 2013-08-28 DIAGNOSIS — Z950 Presence of cardiac pacemaker: Secondary | ICD-10-CM | POA: Insufficient documentation

## 2013-08-28 DIAGNOSIS — M549 Dorsalgia, unspecified: Secondary | ICD-10-CM

## 2013-08-28 DIAGNOSIS — Z7901 Long term (current) use of anticoagulants: Secondary | ICD-10-CM | POA: Insufficient documentation

## 2013-08-28 DIAGNOSIS — Z8669 Personal history of other diseases of the nervous system and sense organs: Secondary | ICD-10-CM | POA: Insufficient documentation

## 2013-08-28 DIAGNOSIS — I4891 Unspecified atrial fibrillation: Secondary | ICD-10-CM | POA: Insufficient documentation

## 2013-08-28 DIAGNOSIS — E785 Hyperlipidemia, unspecified: Secondary | ICD-10-CM | POA: Insufficient documentation

## 2013-08-28 DIAGNOSIS — K219 Gastro-esophageal reflux disease without esophagitis: Secondary | ICD-10-CM | POA: Insufficient documentation

## 2013-08-28 DIAGNOSIS — Z9861 Coronary angioplasty status: Secondary | ICD-10-CM | POA: Insufficient documentation

## 2013-08-28 LAB — CBC WITH DIFFERENTIAL/PLATELET
Basophils Absolute: 0 10*3/uL (ref 0.0–0.1)
Basophils Relative: 0 % (ref 0–1)
EOS PCT: 0 % (ref 0–5)
Eosinophils Absolute: 0 10*3/uL (ref 0.0–0.7)
HEMATOCRIT: 35.8 % — AB (ref 36.0–46.0)
HEMOGLOBIN: 12.4 g/dL (ref 12.0–15.0)
LYMPHS ABS: 1.5 10*3/uL (ref 0.7–4.0)
Lymphocytes Relative: 24 % (ref 12–46)
MCH: 32.1 pg (ref 26.0–34.0)
MCHC: 34.6 g/dL (ref 30.0–36.0)
MCV: 92.7 fL (ref 78.0–100.0)
MONOS PCT: 8 % (ref 3–12)
Monocytes Absolute: 0.5 10*3/uL (ref 0.1–1.0)
Neutro Abs: 4.1 10*3/uL (ref 1.7–7.7)
Neutrophils Relative %: 68 % (ref 43–77)
PLATELETS: 214 10*3/uL (ref 150–400)
RBC: 3.86 MIL/uL — AB (ref 3.87–5.11)
RDW: 13.1 % (ref 11.5–15.5)
WBC: 6 10*3/uL (ref 4.0–10.5)

## 2013-08-28 LAB — COMPREHENSIVE METABOLIC PANEL
ALT: 16 U/L (ref 0–35)
AST: 21 U/L (ref 0–37)
Albumin: 3.9 g/dL (ref 3.5–5.2)
Alkaline Phosphatase: 99 U/L (ref 39–117)
BUN: 8 mg/dL (ref 6–23)
CALCIUM: 10.3 mg/dL (ref 8.4–10.5)
CO2: 27 meq/L (ref 19–32)
Chloride: 100 mEq/L (ref 96–112)
Creatinine, Ser: 0.59 mg/dL (ref 0.50–1.10)
GFR, EST NON AFRICAN AMERICAN: 84 mL/min — AB (ref >90)
GLUCOSE: 95 mg/dL (ref 70–99)
Potassium: 4 mEq/L (ref 3.7–5.3)
Sodium: 140 mEq/L (ref 137–147)
Total Bilirubin: 0.8 mg/dL (ref 0.3–1.2)
Total Protein: 7.5 g/dL (ref 6.0–8.3)

## 2013-08-28 LAB — PROTIME-INR
INR: 2.08 — ABNORMAL HIGH (ref 0.00–1.49)
Prothrombin Time: 22.7 seconds — ABNORMAL HIGH (ref 11.6–15.2)

## 2013-08-28 LAB — APTT: APTT: 46 s — AB (ref 24–37)

## 2013-08-28 MED ORDER — ONDANSETRON HCL 4 MG/2ML IJ SOLN
4.0000 mg | Freq: Once | INTRAMUSCULAR | Status: AC
  Administered 2013-08-28: 4 mg via INTRAVENOUS
  Filled 2013-08-28: qty 2

## 2013-08-28 MED ORDER — MORPHINE SULFATE 4 MG/ML IJ SOLN
4.0000 mg | Freq: Once | INTRAMUSCULAR | Status: AC
  Administered 2013-08-28: 4 mg via INTRAVENOUS
  Filled 2013-08-28: qty 1

## 2013-08-28 MED ORDER — HYDROMORPHONE HCL PF 1 MG/ML IJ SOLN
1.0000 mg | Freq: Once | INTRAMUSCULAR | Status: AC
  Administered 2013-08-28: 1 mg via INTRAVENOUS
  Filled 2013-08-28: qty 1

## 2013-08-28 MED ORDER — OXYCODONE HCL 5 MG PO TABS: 5.0000 mg | ORAL_TABLET | Freq: Four times a day (QID) | ORAL | Status: DC | PRN

## 2013-08-28 MED ORDER — DIAZEPAM 5 MG/ML IJ SOLN
5.0000 mg | Freq: Once | INTRAMUSCULAR | Status: AC
  Administered 2013-08-28: 5 mg via INTRAVENOUS
  Filled 2013-08-28: qty 2

## 2013-08-28 NOTE — ED Notes (Signed)
Patient presents with severe 10/10 lower back pain, s/p lumbar 2 kyphoplasty on 08/09/13, pain is non-radiating, denies changes in bowel and bladder, full motor and sensation bilaterally.  denies chest pain, sob.

## 2013-08-28 NOTE — ED Notes (Signed)
Patient returned from XR. 

## 2013-08-28 NOTE — Discharge Instructions (Signed)
Please call Dr. Verlee RossettiElsner's office and make an appointment for early next week.  You will need to rest and avoid walking, bending, lifting, and twisting as much as possible.  Please take your medications as prescribed.  Back Pain, Adult Low back pain is very common. About 1 in 5 people have back pain.The cause of low back pain is rarely dangerous. The pain often gets better over time.About half of people with a sudden onset of back pain feel better in just 2 weeks. About 8 in 10 people feel better by 6 weeks.  CAUSES Some common causes of back pain include:  Strain of the muscles or ligaments supporting the spine.  Wear and tear (degeneration) of the spinal discs.  Arthritis.  Direct injury to the back. DIAGNOSIS Most of the time, the direct cause of low back pain is not known.However, back pain can be treated effectively even when the exact cause of the pain is unknown.Answering your caregiver's questions about your overall health and symptoms is one of the most accurate ways to make sure the cause of your pain is not dangerous. If your caregiver needs more information, he or she may order lab work or imaging tests (X-rays or MRIs).However, even if imaging tests show changes in your back, this usually does not require surgery. HOME CARE INSTRUCTIONS For many people, back pain returns.Since low back pain is rarely dangerous, it is often a condition that people can learn to [facility] their own.   Remain active. It is stressful on the back to sit or stand in one place. Do not sit, drive, or stand in one place for more than 30 minutes at a time. Take short walks on level surfaces as soon as pain allows.Try to increase the length of time you walk each day.  Do not stay in bed.Resting more than 1 or 2 days can delay your recovery.  Do not avoid exercise or work.Your body is made to move.It is not dangerous to be active, even though your back may hurt.Your back will likely heal faster if you  return to being active before your pain is gone.  Pay attention to your body when you bend and lift. Many people have less discomfortwhen lifting if they bend their knees, keep the load close to their bodies,and avoid twisting. Often, the most comfortable positions are those that put less stress on your recovering back.  Find a comfortable position to sleep. Use a firm mattress and lie on your side with your knees slightly bent. If you lie on your back, put a pillow under your knees.  Only take over-the-counter or prescription medicines as directed by your caregiver. Over-the-counter medicines to reduce pain and inflammation are often the most helpful.Your caregiver may prescribe muscle relaxant drugs.These medicines help dull your pain so you can more quickly return to your normal activities and healthy exercise.  Put ice on the injured area.  Put ice in a plastic bag.  Place a towel between your skin and the bag.  Leave the ice on for 15-20 minutes, 03-04 times a day for the first 2 to 3 days. After that, ice and heat may be alternated to reduce pain and spasms.  Ask your caregiver about trying back exercises and gentle massage. This may be of some benefit.  Avoid feeling anxious or stressed.Stress increases muscle tension and can worsen back pain.It is important to recognize when you are anxious or stressed and learn ways to manage it.Exercise is a great option. SEEK MEDICAL  CARE IF:  You have pain that is not relieved with rest or medicine.  You have pain that does not improve in 1 week.  You have new symptoms.  You are generally not feeling well. SEEK IMMEDIATE MEDICAL CARE IF:   You have pain that radiates from your back into your legs.  You develop new bowel or bladder control problems.  You have unusual weakness or numbness in your arms or legs.  You develop nausea or vomiting.  You develop abdominal pain.  You feel faint. Document Released: 06/17/2005  Document Revised: 12/17/2011 Document Reviewed: 11/05/2010 [facility] Patient Information 2014 [place], [place].

## 2013-08-28 NOTE — ED Provider Notes (Signed)
CSN: [id number]     Arrival date & time 08/28/13  1427 History   First MD Initiated Contact with Patient 08/28/13 1506     Chief Complaint  Patient presents with  . Back Pain     (Consider location/radiation/quality/duration/timing/severity/associated sxs/prior Treatment) HPI Comments: Patient presents to the ED with a chief complaint of back pain.  She is s/p kyphoplasty on 08/09/13 by Dr. Danielle Dess.  She states that she had a follow-up appointment on Wednesday of this week and that everything looked ok.  She states that since the appointment her back pain has been worsening.  She denies any fevers.  Denies saddle anesthesia or bowel or bladder incontinence.  She states that she has been able to ambulate like normal.  She states that the pain is 10/10.  The pain does not radiate.  She has tried taking Ultram for pain with no relief.  The history is provided by the patient. No language interpreter was used.    Past Medical History  Diagnosis Date  . PAF (paroxysmal atrial fibrillation)     s/p afib ablation  . Hypertension   . Glaucoma   . LBBB (left bundle branch block)   . GERD (gastroesophageal reflux disease)   . Anemia   . GI bleed     previously with pradaxa  . Hyperlipidemia   . Coronary disease     s/p PTCA 1990s  . 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 H/O hiatal hernia    Past Surgical History  Procedure Laterality Date  . Coronary angioplasty  1990s    by Dr Aleen Campi  . Colonoscopy    . Esophagogastrectomy    . Tee without cardioversion  11/11/2011    Procedure: TRANSESOPHAGEAL ECHOCARDIOGRAM (TEE);  Surgeon: Chrystie Nose, MD;  Location: [facility] ENDOSCOPY;  Service: Cardiovascular;  Laterality: N/A;  . Atrial fibrillation ablation  11/12/11    PVI by Dr Johney Frame  . Pacemaker insertion  11/13/11    MDT implanted by Dr Royann Shivers for 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 observed on EP study  . Cataract extraction w/ intraocular  lens  implant, bilateral    . Eye surgery      GLAUCOMA SURG LT EYE  . Kyphoplasty N/A 08/09/2013    Procedure: LUMBAR TWO KYPHOPLASTY;  Surgeon: Barnett Abu, MD;  Location: [facility] NEURO ORS;  Service: Neurosurgery;  Laterality: N/A;  L2 Kyphoplasty   Family History  Problem Relation Age of Onset  . Hypertension     History  Substance Use Topics  . Smoking status: Never Smoker   . Smokeless tobacco: Not on file  . Alcohol Use: No   OB History   Grav Para Term Preterm Abortions TAB SAB Ect Mult Living                 Review of Systems  All other systems reviewed and are negative.      Allergies  Azor; Beta adrenergic blockers; Norvasc; Pradaxa; Questran; and Sulfa drugs cross reactors  Home Medications   Current Outpatient Rx  Name  Route  Sig  Dispense  Refill  . acetaminophen (TYLENOL) 500 MG tablet   Oral   Take 500 mg by mouth daily as needed for mild pain.         Marland Kitchen amLODipine-olmesartan (AZOR) 5-40 MG per tablet   Oral   Take 1 tablet by mouth daily.         Marland Kitchen  atorvastatin (LIPITOR) 40 MG tablet   Oral   Take 40 mg by mouth daily.          . brimonidine (ALPHAGAN P) 0.15 % ophthalmic solution   Right Eye   Place 1 drop into the right eye 3 (three) times daily.          . Cholecalciferol (VITAMIN D3) 1000 UNITS CAPS   Oral   Take 1 capsule by mouth daily.         Marland Kitchen diltiazem (CARDIZEM) 30 MG tablet   Oral   Take 1 tablet (30 mg total) by mouth 4 (four) times daily.   30 tablet   1   . dorzolamide (TRUSOPT) 2 % ophthalmic solution   Right Eye   Place 1 drop into the right eye 2 (two) times daily.          Marland Kitchen enoxaparin (LOVENOX) 40 MG/0.4ML injection   Subcutaneous   Inject 40 mg into the skin daily.         . Fe Fum-FePoly-Vit C-Vit B3 (INTEGRA) 62.5-62.5-40-3 MG CAPS   Oral   Take 1 capsule by mouth daily.         . hydrALAZINE (APRESOLINE) 50 MG tablet   Oral   Take 50 mg by mouth 2 (two) times daily.         . Multiple  Vitamin (MULITIVITAMIN WITH MINERALS) TABS   Oral   Take 1 tablet by mouth daily.         . nitroGLYCERIN (NITROSTAT) 0.4 MG SL tablet   Sublingual   Place 0.4 mg under the tongue every 5 (five) minutes as needed. For chest pain.         Marland Kitchen Olopatadine HCl (PATADAY) 0.2 % SOLN   Both Eyes   Place 1 drop into both eyes daily.          Marland Kitchen OVER THE COUNTER MEDICATION   Oral   Take 1 tablet by mouth 3 (three) times daily. Calcium Magnesium Zinc Copper and vitamin d3         . pantoprazole (PROTONIX) 40 MG tablet   Oral   Take 40 mg by mouth daily.          Bertram Gala Glycol-Propyl Glycol (SYSTANE OP)   Both Eyes   Place 1 drop into both eyes daily as needed (for dry eyes). For moisture.         . traMADol (ULTRAM) 50 MG tablet   Oral   Take 50 mg by mouth 3 (three) times daily.         . Travoprost (TRAVATAN Z OP)   Right Eye   Place 1 drop into the right eye at bedtime. Right Eye only         . warfarin (COUMADIN) 5 MG tablet      Take as directed by coumadin clinic          BP 132/62  Pulse 75  Temp(Src) 98 F (36.7 C) (Oral)  Resp 17  SpO2 99% Physical Exam  Nursing note and vitals reviewed. Constitutional: She is oriented to person, place, and time. She appears well-developed and well-nourished. No distress.  HENT:  Head: Normocephalic and atraumatic.  Eyes: Conjunctivae and EOM are normal. Right eye exhibits no discharge. Left eye exhibits no discharge. No scleral icterus.  Neck: Normal range of motion. Neck supple. No tracheal deviation present.  Cardiovascular: Normal rate, regular rhythm and normal heart sounds.  Exam reveals no gallop and no  friction rub.   No murmur heard. Pulmonary/Chest: Effort normal and breath sounds normal. No respiratory distress. She has no wheezes.  Abdominal: Soft. She exhibits no distension. There is no tenderness.  Musculoskeletal: Normal range of motion.  Lumbar paraspinal muscles tender to palpation, no bony  tenderness, step-offs, or gross abnormality or deformity of spine, patient is able to ambulate, moves all extremities  Bilateral great toe extension intact Bilateral plantar/dorsiflexion intact  Neurological: She is alert and oriented to person, place, and time. She has normal reflexes.  Sensation and strength intact bilaterally Symmetrical reflexes  Skin: Skin is warm. She is not diaphoretic.  Psychiatric: She has a normal mood and affect. Her behavior is normal. Judgment and thought content normal.    ED Course  Procedures (including critical care time) Results for orders placed during the hospital encounter of 08/28/13  CBC WITH DIFFERENTIAL      Result Value Ref Range   WBC 6.0  4.0 - 10.5 K/uL   RBC 3.86 (*) 3.87 - 5.11 MIL/uL   Hemoglobin 12.4  12.0 - 15.0 g/dL   HCT 60.0 (*) 45.9 - 97.7 %   MCV 92.7  78.0 - 100.0 fL   MCH 32.1  26.0 - 34.0 pg   MCHC 34.6  30.0 - 36.0 g/dL   RDW 41.4  23.9 - 53.2 %   Platelets 214  150 - 400 K/uL   Neutrophils Relative % 68  43 - 77 %   Neutro Abs 4.1  1.7 - 7.7 K/uL   Lymphocytes Relative 24  12 - 46 %   Lymphs Abs 1.5  0.7 - 4.0 K/uL   Monocytes Relative 8  3 - 12 %   Monocytes Absolute 0.5  0.1 - 1.0 K/uL   Eosinophils Relative 0  0 - 5 %   Eosinophils Absolute 0.0  0.0 - 0.7 K/uL   Basophils Relative 0  0 - 1 %   Basophils Absolute 0.0  0.0 - 0.1 K/uL  COMPREHENSIVE METABOLIC PANEL      Result Value Ref Range   Sodium 140  137 - 147 mEq/L   Potassium 4.0  3.7 - 5.3 mEq/L   Chloride 100  96 - 112 mEq/L   CO2 27  19 - 32 mEq/L   Glucose, Bld 95  70 - 99 mg/dL   BUN 8  6 - 23 mg/dL   Creatinine, Ser 0.23  0.50 - 1.10 mg/dL   Calcium 34.3  8.4 - 56.8 mg/dL   Total Protein 7.5  6.0 - 8.3 g/dL   Albumin 3.9  3.5 - 5.2 g/dL   AST 21  0 - 37 U/L   ALT 16  0 - 35 U/L   Alkaline Phosphatase 99  39 - 117 U/L   Total Bilirubin 0.8  0.3 - 1.2 mg/dL   GFR calc non Af Amer 84 (*) >90 mL/min   GFR calc Af Amer >90  >90 mL/min   PROTIME-INR      Result Value Ref Range   Prothrombin Time 22.7 (*) 11.6 - 15.2 seconds   INR 2.08 (*) 0.00 - 1.49  APTT      Result Value Ref Range   aPTT 46 (*) 24 - 37 seconds   Dg Chest 2 View  08/06/2013   CLINICAL DATA:  Hypertension.  EXAM: CHEST  2 VIEW  COMPARISON:  CT chest 06/25/2012 and 05/16/2010. Chest x-rays 11/14/2011 and 04/30/2010  FINDINGS: Mediastinum and hilar structures are normal. Hyperexpansion of both  lung fields noted consistent with COPD. Stable changes of pleural parenchymal scarring noted. Stable pulmonary nodule noted right upper lobe, this is unchanged from multiple prior studies dating to 2011 and thus benign. Heart size normal. Cardiac pacer noted with lead tips in right atrium and right ventricle. A lower thoracic, upper lumbar near complete compression fracture noted, age undetermined. MRI can be obtained for further evaluation .  IMPRESSION: 1. COPD.  No acute cardiopulmonary disease. 2. Stable sub cm pulmonary nodule right upper lobe unchanged from multiple prior studies dating to 04/30/2010 and thus benign. 3. Lower thoracic, upper lumbar compression fracture, near complete. MRI can be obtained for further evaluation as needed.   Electronically Signed   By: Maisie Fushomas  Register   On: 08/06/2013 14:17   Dg Lumbar Spine 2-3 Views  08/09/2013   CLINICAL DATA:  L2 kyphoplasty  EXAM: DG C-ARM 1-60 MIN; LUMBAR SPINE - 2-3 VIEW  COMPARISON:  DG CHEST 2 VIEW dated 08/06/2013; DG OUTSIDE FILMS SPINE dated 07/12/2013  FLUOROSCOPY TIME:  Forty-five second  FINDINGS: Two spot intraoperative radiographic images of the thoracolumbar junction a provided for review.  Images demonstrate the sequela of kyphoplasty/ vertebroplasty of the suspected L1 vertebral body, though note, exact spinal labeling is difficult secondary to exclusion of the lumbosacral junction.  There is a persistent moderate to severe compression deformity of the suspected L1 vertebral body.  IMPRESSION: Post presumed L1  vertebroplasty/kyphoplasty.   Electronically Signed   By: Simonne ComeJohn  Watts M.D.   On: 08/09/2013 13:44   Dg Lumbar Spine Complete  08/28/2013   CLINICAL DATA:  Low back/pelvic pain x2 months  EXAM: LUMBAR SPINE - COMPLETE 4+ VIEW  COMPARISON:  [facility] lumbar spine radiographs dated 08/25/2013. CT abdomen pelvis dated 01/31/2011.  FINDINGS: Normal lumbar lordosis.  Grade 1 anterolisthesis of L5 on S1.  Mild to moderate superior endplate compression fracture deformities at L1 and L2, with prior vertebral augmentation at L2. This appearance is unchanged from recent lumbar spine radiographs but is new from prior 2012 CT.  Visualized bony pelvis is intact.  IMPRESSION: Mild to moderate superior endplate compression fracture deformities at L1 and L2, with prior vertebral augmentation at L2, unchanged.  Grade 1 anterolisthesis of L5 on S1.   Electronically Signed   By: Charline BillsSriyesh  Krishnan M.D.   On: 08/28/2013 17:05   Dg C-arm 1-60 Min  08/09/2013   CLINICAL DATA:  L2 kyphoplasty  EXAM: DG C-ARM 1-60 MIN; LUMBAR SPINE - 2-3 VIEW  COMPARISON:  DG CHEST 2 VIEW dated 08/06/2013; DG OUTSIDE FILMS SPINE dated 07/12/2013  FLUOROSCOPY TIME:  Forty-five second  FINDINGS: Two spot intraoperative radiographic images of the thoracolumbar junction a provided for review.  Images demonstrate the sequela of kyphoplasty/ vertebroplasty of the suspected L1 vertebral body, though note, exact spinal labeling is difficult secondary to exclusion of the lumbosacral junction.  There is a persistent moderate to severe compression deformity of the suspected L1 vertebral body.  IMPRESSION: Post presumed L1 vertebroplasty/kyphoplasty.   Electronically Signed   By: Simonne ComeJohn  Watts M.D.   On: 08/09/2013 13:44      EKG Interpretation None      MDM   Final diagnoses:  Back pain    Patient with back pain.  Recent kyphoplasty.  Discussed with Dr. Silverio LayYao, who has seen the patient.  Will check lumbar films.  Patient with back pain.  No  neurological deficits and normal neuro exam.  Patient can walk but states is painful.  No loss of  bowel or bladder control.  No concern for cauda equina.  No fever, night sweats, weight loss, h/o cancer, IVDU.    5:45 PM Patient discussed with Dr. Newell Coral, who believes that the patient has a new L1 fracture and will need to likely have another kyphoplasty.  Dr. Newell Coral recommends starting the patient on oxycodone 5mg  IR q6hr.  Dr. Newell Coral also recommends that the patient remain as sedentary as possible.  I have discussed the plan with Dr. Silverio Lay and with the patient.  All are in agreement.  Will discharge to home with pain meds and follow-up instructions.    Roxy Horseman, PA-C 08/28/13 1749

## 2013-08-28 NOTE — ED Notes (Signed)
Roxy Horseman, PA at bedside

## 2013-08-28 NOTE — ED Notes (Signed)
Pt presents to department for evaluation of back pain. Recent back surgery on 08/09/2013 by Dr. Danielle Dess. Unable to control pain at home. 10/10 pain upon arrival. Pt is alert and oriented x4.

## 2013-08-28 NOTE — ED Notes (Signed)
Robert Browning, PA at bedside 

## 2013-08-29 NOTE — ED Provider Notes (Signed)
Medical screening examination/treatment/procedure(s) were performed by non-physician practitioner and as supervising physician I was immediately available for consultation/collaboration.   EKG Interpretation None        Adell Koval H Shelbylynn Walczyk, MD 08/29/13 0001 

## 2013-08-31 ENCOUNTER — Ambulatory Visit (INDEPENDENT_AMBULATORY_CARE_PROVIDER_SITE_OTHER): Payer: Medicare Other | Admitting: *Deleted

## 2013-08-31 DIAGNOSIS — I441 Atrioventricular block, second degree: Secondary | ICD-10-CM

## 2013-08-31 DIAGNOSIS — I4891 Unspecified atrial fibrillation: Secondary | ICD-10-CM

## 2013-08-31 DIAGNOSIS — I480 Paroxysmal atrial fibrillation: Secondary | ICD-10-CM

## 2013-08-31 LAB — PACEMAKER DEVICE OBSERVATION

## 2013-09-01 ENCOUNTER — Other Ambulatory Visit: Payer: Self-pay | Admitting: Neurological Surgery

## 2013-09-02 ENCOUNTER — Encounter (HOSPITAL_COMMUNITY): Payer: Self-pay

## 2013-09-05 LAB — MDC_IDC_ENUM_SESS_TYPE_REMOTE
Battery Impedance: 158 Ohm
Battery Remaining Longevity: 134 mo
Brady Statistic AP VS Percent: 55 %
Brady Statistic AS VS Percent: 45 %
Date Time Interrogation Session: 20150303160633
Lead Channel Impedance Value: 424 Ohm
Lead Channel Impedance Value: 464 Ohm
Lead Channel Pacing Threshold Amplitude: 0.5 V
Lead Channel Pacing Threshold Amplitude: 0.75 V
Lead Channel Pacing Threshold Pulse Width: 0.4 ms
Lead Channel Pacing Threshold Pulse Width: 0.4 ms
Lead Channel Sensing Intrinsic Amplitude: 22.4 mV
Lead Channel Setting Sensing Sensitivity: 5.6 mV
MDC IDC MSMT BATTERY VOLTAGE: 2.78 V
MDC IDC MSMT LEADCHNL RA SENSING INTR AMPL: 2.8 mV
MDC IDC SET LEADCHNL RA PACING AMPLITUDE: 2 V
MDC IDC SET LEADCHNL RV PACING AMPLITUDE: 2.5 V
MDC IDC SET LEADCHNL RV PACING PULSEWIDTH: 0.4 ms
MDC IDC STAT BRADY AP VP PERCENT: 0 %
MDC IDC STAT BRADY AS VP PERCENT: 0 %

## 2013-09-06 ENCOUNTER — Encounter (HOSPITAL_COMMUNITY): Payer: Self-pay

## 2013-09-06 ENCOUNTER — Encounter (HOSPITAL_COMMUNITY)
Admission: RE | Admit: 2013-09-06 | Discharge: 2013-09-06 | Disposition: A | Discharge status: Home / Self Care | Payer: Medicare Other | Source: Ambulatory Visit | Attending: Neurological Surgery | Admitting: Neurological Surgery

## 2013-09-06 HISTORY — DX: Acute pharyngitis, unspecified: J02.9

## 2013-09-06 LAB — APTT: aPTT: 33 seconds (ref 24–37)

## 2013-09-06 LAB — BASIC METABOLIC PANEL
BUN: 7 mg/dL (ref 6–23)
CO2: 26 mEq/L (ref 19–32)
Calcium: 10.1 mg/dL (ref 8.4–10.5)
Chloride: 103 mEq/L (ref 96–112)
Creatinine, Ser: 0.46 mg/dL — ABNORMAL LOW (ref 0.50–1.10)
Glucose, Bld: 91 mg/dL (ref 70–99)
Potassium: 4.2 mEq/L (ref 3.7–5.3)
Sodium: 140 mEq/L (ref 137–147)

## 2013-09-06 LAB — CBC
HCT: 34.5 % — ABNORMAL LOW (ref 36.0–46.0)
Hemoglobin: 12 g/dL (ref 12.0–15.0)
MCH: 31.7 pg (ref 26.0–34.0)
MCHC: 34.8 g/dL (ref 30.0–36.0)
MCV: 91 fL (ref 78.0–100.0)
PLATELETS: 211 10*3/uL (ref 150–400)
RBC: 3.79 MIL/uL — ABNORMAL LOW (ref 3.87–5.11)
RDW: 12.8 % (ref 11.5–15.5)
WBC: 4.1 10*3/uL (ref 4.0–10.5)

## 2013-09-06 LAB — SURGICAL PCR SCREEN
MRSA, PCR: NEGATIVE
Staphylococcus aureus: NEGATIVE

## 2013-09-06 LAB — PROTIME-INR
INR: 1.2 (ref 0.00–1.49)
Prothrombin Time: 14.9 seconds (ref 11.6–15.2)

## 2013-09-06 MED ORDER — CEFAZOLIN SODIUM-DEXTROSE 2-3 GM-% IV SOLR
2.0000 g | INTRAVENOUS | Status: AC
  Administered 2013-09-07: 2 g via INTRAVENOUS
  Filled 2013-09-06: qty 50

## 2013-09-06 NOTE — Pre-Procedure Instructions (Signed)
Rhonda [name]  09/06/2013   Your procedure is scheduled on:  Tues, Mar 10 @ 10:00 AM  Report to [facility] Short Stay Entrance A  at 8:00 AM.  Call this number if you have problems the morning of surgery: [phone]   Remember:   Do not eat food or drink liquids after midnight.   Take these medicines the morning of surgery with A SIP OF WATER: Albuterol<Bring Your Inhaler With You>,Eye Drops,Apresoline(Hydralazine),Protonix(Pantoprazole),and Tramadol(Ultram)              No Goody's,BC's,Aleve,Aspirin,Fish Oil,or any Herbal Medications   Do not wear jewelry, make-up or nail polish.  Do not wear lotions, powders, or perfumes. You may wear deodorant.  Do not shave 48 hours prior to surgery.   Do not bring valuables to the hospital.  [facility] is not responsible                  for any belongings or valuables.               Contacts, dentures or bridgework may not be worn into surgery.  Leave suitcase in the car. After surgery it may be brought to your room.  For patients admitted to the hospital, discharge time is determined by your                treatment team.               Patients discharged the day of surgery will not be allowed to drive  home.    Special Instructions:  [facility] - Preparing for Surgery  Before surgery, you can play an important role.  Because skin is not sterile, your skin needs to be as free of germs as possible.  You can reduce the number of germs on you skin by washing with CHG (chlorahexidine gluconate) soap before surgery.  CHG is an antiseptic cleaner which kills germs and bonds with the skin to continue killing germs even after washing.  Please DO NOT use if you have an allergy to CHG or antibacterial soaps.  If your skin becomes reddened/irritated stop using the CHG and inform your nurse when you arrive at Short Stay.  Do not shave (including legs and underarms) for at least 48 hours prior to the first CHG shower.  You may shave your face.  Please  follow these instructions carefully:   1.  Shower with CHG Soap the night before surgery and the                                morning of Surgery.  2.  If you choose to wash your hair, wash your hair first as usual with your       normal shampoo.  3.  After you shampoo, rinse your hair and body thoroughly to remove the                      Shampoo.  4.  Use CHG as you would any other liquid soap.  You can apply chg directly       to the skin and wash gently with scrungie or a clean washcloth.  5.  Apply the CHG Soap to your body ONLY FROM THE NECK DOWN.        Do not use on open wounds or open sores.  Avoid contact with your eyes,  ears, mouth and genitals (private parts).  Wash genitals (private parts)       with your normal soap.  6.  Wash thoroughly, paying special attention to the area where your surgery        will be performed.  7.  Thoroughly rinse your body with warm water from the neck down.  8.  DO NOT shower/wash with your normal soap after using and rinsing off       the CHG Soap.  9.  Pat yourself dry with a clean towel.            10.  Wear clean pajamas.            11.  Place clean sheets on your bed the night of your first shower and do not        sleep with pets.  Day of Surgery  Do not apply any lotions/deoderants the morning of surgery.  Please wear clean clothes to the hospital/surgery center.     Please read over the following fact sheets that you were given: Pain Booklet, Coughing and Deep Breathing, MRSA Information and Surgical Site Infection Prevention

## 2013-09-06 NOTE — Progress Notes (Signed)
Anesthesia Chart Review:  Patient is a 80 year old female scheduled for L1 kyphoplasty on 09/07/13 by Dr. Danielle Dess.  She is s/p L2 acrylic balloon kyphoplasty on 08/09/13. See my note from 08/06/13.  Other history includes non-smoker, afib/PAF s/p cardioversion 09/08/10, left BBB, dual chamber Medtr[employer] PPM for tachy-brady syndrome and 2nd degree AVB on 11/13/11, CAD s/p RCA stent '90's, GERD, hiatal hernia, HTN, anemia, HLD, GI bleed, glaucoma, cataract extraction.  Cardiologist is Dr. Johney Frame. PCP is Dr. [name].  EKG on 08/06/13 showed NSR, left BBB.  Echo on 06/16/13 showed: - Left ventricle: The cavity size was normal. There was mild concentric hypertrophy. Systolic function was normal. The estimated ejection fraction was in the range of 55% to 60%. Wall motion was normal; there were no regional wall motion abnormalities. Doppler parameters are consistent with abnormal left ventricular relaxation (grade 1 diastolic dysfunction). - Tricuspid valve: Mild regurgitation. - Pulmonary arteries: Systolic pressure was within the normal range.  She thought she may have had a stress test in 2011 at the prior [facility].  Records requested, but are still pending.  Cardiac cath on 11/03/08 showed: FINAL IMPRESSION: Right dominant coronary artery system with moderate disease noted in the mid right coronary artery (50-60%) that appears to be unchanged from previous catheterization in 2008. Otherwise, no significant disease noted. EF 55%.  Mildly abnormal, but no significant ICA stenosis by carotid duplex in 10/2011.  CXR report on 08/06/13 noted.  Preoperative labs noted. She held Coumadin starting 09/01/13 and started a Lovenox bridge on 09/02/13 in anticipation for this procedure.  She tolerated kyphoplasty last month.  If no acute changes then I would anticipate that she can proceed as planned.  Velna [facility] Short Stay Center/Anesthesiology Phone [phone] 09/06/2013  2:06 PM

## 2013-09-06 NOTE — Progress Notes (Addendum)
CARDIOLOGIST IS DR.ALLRE  STRESS TEST DONE AT [facility] IN 2013-TO REQUEST  Multiple echo reports in epic with most recent in 2014  Heart cath report in epic from 2008  EKG and CXR in epic from 08-06-13  Medical Md is Dr.Walter Renne Crigler

## 2013-09-07 ENCOUNTER — Ambulatory Visit (HOSPITAL_COMMUNITY): Payer: Medicare Other

## 2013-09-07 ENCOUNTER — Observation Stay (HOSPITAL_COMMUNITY)
Admission: RE | Admit: 2013-09-07 | Discharge: 2013-09-07 | Disposition: A | Discharge status: Home / Self Care | Payer: Medicare Other | Source: Ambulatory Visit | Attending: Neurological Surgery | Admitting: Neurological Surgery

## 2013-09-07 ENCOUNTER — Encounter (HOSPITAL_COMMUNITY): Payer: Self-pay | Admitting: Surgery

## 2013-09-07 ENCOUNTER — Encounter (HOSPITAL_COMMUNITY)
Admission: RE | Disposition: A | Discharge status: Home / Self Care | Payer: Self-pay | Source: Ambulatory Visit | Attending: Neurological Surgery

## 2013-09-07 ENCOUNTER — Ambulatory Visit (HOSPITAL_COMMUNITY): Payer: Medicare Other | Admitting: Anesthesiology

## 2013-09-07 ENCOUNTER — Encounter (HOSPITAL_COMMUNITY): Payer: Medicare Other | Admitting: Vascular Surgery

## 2013-09-07 DIAGNOSIS — I10 Essential (primary) hypertension: Secondary | ICD-10-CM | POA: Insufficient documentation

## 2013-09-07 DIAGNOSIS — Z01812 Encounter for preprocedural laboratory examination: Secondary | ICD-10-CM | POA: Insufficient documentation

## 2013-09-07 DIAGNOSIS — E7800 Pure hypercholesterolemia, unspecified: Secondary | ICD-10-CM | POA: Insufficient documentation

## 2013-09-07 DIAGNOSIS — Z7901 Long term (current) use of anticoagulants: Secondary | ICD-10-CM | POA: Insufficient documentation

## 2013-09-07 DIAGNOSIS — I2510 Atherosclerotic heart disease of native coronary artery without angina pectoris: Secondary | ICD-10-CM | POA: Insufficient documentation

## 2013-09-07 DIAGNOSIS — I4891 Unspecified atrial fibrillation: Secondary | ICD-10-CM | POA: Insufficient documentation

## 2013-09-07 DIAGNOSIS — D649 Anemia, unspecified: Secondary | ICD-10-CM | POA: Insufficient documentation

## 2013-09-07 DIAGNOSIS — K449 Diaphragmatic hernia without obstruction or gangrene: Secondary | ICD-10-CM | POA: Insufficient documentation

## 2013-09-07 DIAGNOSIS — S32019A Unspecified fracture of first lumbar vertebra, initial encounter for closed fracture: Secondary | ICD-10-CM | POA: Diagnosis present

## 2013-09-07 DIAGNOSIS — M8448XA Pathological fracture, other site, initial encounter for fracture: Secondary | ICD-10-CM | POA: Diagnosis not present

## 2013-09-07 DIAGNOSIS — I447 Left bundle-branch block, unspecified: Secondary | ICD-10-CM | POA: Insufficient documentation

## 2013-09-07 DIAGNOSIS — H409 Unspecified glaucoma: Secondary | ICD-10-CM | POA: Insufficient documentation

## 2013-09-07 DIAGNOSIS — Z950 Presence of cardiac pacemaker: Secondary | ICD-10-CM | POA: Insufficient documentation

## 2013-09-07 DIAGNOSIS — Z9861 Coronary angioplasty status: Secondary | ICD-10-CM | POA: Insufficient documentation

## 2013-09-07 DIAGNOSIS — Z87311 Personal history of (healed) other pathological fracture: Secondary | ICD-10-CM | POA: Insufficient documentation

## 2013-09-07 DIAGNOSIS — R011 Cardiac murmur, unspecified: Secondary | ICD-10-CM | POA: Insufficient documentation

## 2013-09-07 DIAGNOSIS — K219 Gastro-esophageal reflux disease without esophagitis: Secondary | ICD-10-CM | POA: Insufficient documentation

## 2013-09-07 HISTORY — PX: KYPHOPLASTY: SHX5884

## 2013-09-07 SURGERY — KYPHOPLASTY
Anesthesia: General

## 2013-09-07 MED ORDER — METHOCARBAMOL 100 MG/ML IJ SOLN
500.0000 mg | Freq: Four times a day (QID) | INTRAMUSCULAR | Status: DC | PRN
  Administered 2013-09-07: 500 mg via INTRAVENOUS
  Filled 2013-09-07: qty 5

## 2013-09-07 MED ORDER — OXYCODONE HCL 5 MG PO TABS
ORAL_TABLET | ORAL | Status: AC
  Filled 2013-09-07: qty 1

## 2013-09-07 MED ORDER — IRBESARTAN 300 MG PO TABS
300.0000 mg | ORAL_TABLET | Freq: Every day | ORAL | Status: DC
  Filled 2013-09-07: qty 1

## 2013-09-07 MED ORDER — ATORVASTATIN CALCIUM 40 MG PO TABS
40.0000 mg | ORAL_TABLET | Freq: Every day | ORAL | Status: DC
  Filled 2013-09-07: qty 1

## 2013-09-07 MED ORDER — PHENYLEPHRINE 40 MCG/ML (10ML) SYRINGE FOR IV PUSH (FOR BLOOD PRESSURE SUPPORT)
PREFILLED_SYRINGE | INTRAVENOUS | Status: AC
  Filled 2013-09-07: qty 10

## 2013-09-07 MED ORDER — OXYCODONE HCL 5 MG PO TABS: 5.0000 mg | ORAL_TABLET | Freq: Four times a day (QID) | ORAL | Status: DC | PRN

## 2013-09-07 MED ORDER — FENTANYL CITRATE 0.05 MG/ML IJ SOLN
25.0000 ug | INTRAMUSCULAR | Status: DC | PRN
  Administered 2013-09-07 (×2): 50 ug via INTRAVENOUS

## 2013-09-07 MED ORDER — NEOSTIGMINE METHYLSULFATE 1 MG/ML IJ SOLN
INTRAMUSCULAR | Status: AC
  Filled 2013-09-07: qty 10

## 2013-09-07 MED ORDER — ONDANSETRON HCL 4 MG/2ML IJ SOLN: 4.0000 mg | INTRAMUSCULAR | Status: DC | PRN

## 2013-09-07 MED ORDER — POLYVINYL ALCOHOL 1.4 % OP SOLN
1.0000 [drp] | OPHTHALMIC | Status: DC | PRN
  Filled 2013-09-07: qty 15

## 2013-09-07 MED ORDER — TRAVOPROST (BAK FREE) 0.004 % OP SOLN
1.0000 [drp] | Freq: Every day | OPHTHALMIC | Status: DC
  Filled 2013-09-07: qty 2.5

## 2013-09-07 MED ORDER — POLYETHYLENE GLYCOL 3350 17 G PO PACK
17.0000 g | PACK | Freq: Every day | ORAL | Status: DC | PRN
  Filled 2013-09-07: qty 1

## 2013-09-07 MED ORDER — SENNA 8.6 MG PO TABS
1.0000 | ORAL_TABLET | Freq: Two times a day (BID) | ORAL | Status: DC
  Administered 2013-09-07: 8.6 mg via ORAL
  Filled 2013-09-07: qty 1

## 2013-09-07 MED ORDER — MORPHINE SULFATE 2 MG/ML IJ SOLN: 1.0000 mg | INTRAMUSCULAR | Status: DC | PRN

## 2013-09-07 MED ORDER — ALUM & MAG HYDROXIDE-SIMETH 200-200-20 MG/5ML PO SUSP
30.0000 mL | Freq: Four times a day (QID) | ORAL | Status: DC | PRN
Start: 2013-09-07 — End: 2013-09-07

## 2013-09-07 MED ORDER — AMLODIPINE BESYLATE 5 MG PO TABS
5.0000 mg | ORAL_TABLET | Freq: Every day | ORAL | Status: DC
  Filled 2013-09-07: qty 1

## 2013-09-07 MED ORDER — ACETAMINOPHEN 325 MG PO TABS: 650.0000 mg | ORAL_TABLET | ORAL | Status: DC | PRN

## 2013-09-07 MED ORDER — GLYCOPYRROLATE 0.2 MG/ML IJ SOLN
INTRAMUSCULAR | Status: AC
  Filled 2013-09-07: qty 4

## 2013-09-07 MED ORDER — SODIUM CHLORIDE 0.9 % IJ SOLN
3.0000 mL | INTRAMUSCULAR | Status: DC | PRN
Start: 2013-09-07 — End: 2013-09-07

## 2013-09-07 MED ORDER — FENTANYL CITRATE 0.05 MG/ML IJ SOLN
INTRAMUSCULAR | Status: AC
  Administered 2013-09-07: 50 ug via INTRAVENOUS
  Filled 2013-09-07: qty 2

## 2013-09-07 MED ORDER — BISACODYL 10 MG RE SUPP: 10.0000 mg | Freq: Every day | RECTAL | Status: DC | PRN

## 2013-09-07 MED ORDER — ARTIFICIAL TEARS OP OINT
TOPICAL_OINTMENT | OPHTHALMIC | Status: DC | PRN
  Administered 2013-09-07: 1 via OPHTHALMIC

## 2013-09-07 MED ORDER — OXYCODONE HCL 5 MG PO TABS
5.0000 mg | ORAL_TABLET | Freq: Once | ORAL | Status: AC | PRN
  Administered 2013-09-07: 5 mg via ORAL

## 2013-09-07 MED ORDER — ROCURONIUM BROMIDE 50 MG/5ML IV SOLN
INTRAVENOUS | Status: AC
  Filled 2013-09-07: qty 1

## 2013-09-07 MED ORDER — SODIUM CHLORIDE 0.9 % IV SOLN
INTRAVENOUS | Status: DC | PRN
  Administered 2013-09-07: 12:00:00

## 2013-09-07 MED ORDER — FENTANYL CITRATE 0.05 MG/ML IJ SOLN
INTRAMUSCULAR | Status: DC | PRN
  Administered 2013-09-07: 100 ug via INTRAVENOUS

## 2013-09-07 MED ORDER — HYDRALAZINE HCL 50 MG PO TABS
50.0000 mg | ORAL_TABLET | Freq: Two times a day (BID) | ORAL | Status: DC
  Filled 2013-09-07: qty 1

## 2013-09-07 MED ORDER — LACTATED RINGERS IV SOLN
INTRAVENOUS | Status: DC | PRN
  Administered 2013-09-07: 09:00:00 via INTRAVENOUS

## 2013-09-07 MED ORDER — ROCURONIUM BROMIDE 100 MG/10ML IV SOLN
INTRAVENOUS | Status: DC | PRN
  Administered 2013-09-07: 30 mg via INTRAVENOUS

## 2013-09-07 MED ORDER — PHENYLEPHRINE HCL 10 MG/ML IJ SOLN
INTRAMUSCULAR | Status: DC | PRN
  Administered 2013-09-07: 80 ug via INTRAVENOUS

## 2013-09-07 MED ORDER — PROPOFOL 10 MG/ML IV BOLUS
INTRAVENOUS | Status: AC
  Filled 2013-09-07: qty 20

## 2013-09-07 MED ORDER — PHENOL 1.4 % MT LIQD
1.0000 | OROMUCOSAL | Status: DC | PRN
  Filled 2013-09-07: qty 177

## 2013-09-07 MED ORDER — ACETAMINOPHEN 650 MG RE SUPP: 650.0000 mg | RECTAL | Status: DC | PRN

## 2013-09-07 MED ORDER — NEOSTIGMINE METHYLSULFATE 1 MG/ML IJ SOLN
INTRAMUSCULAR | Status: DC | PRN
Start: 2013-09-07 — End: 2013-09-07
  Administered 2013-09-07: 3.7 mg via INTRAVENOUS

## 2013-09-07 MED ORDER — DOCUSATE SODIUM 100 MG PO CAPS
100.0000 mg | ORAL_CAPSULE | Freq: Two times a day (BID) | ORAL | Status: DC
  Administered 2013-09-07: 100 mg via ORAL
  Filled 2013-09-07 (×2): qty 1

## 2013-09-07 MED ORDER — ONDANSETRON HCL 4 MG/2ML IJ SOLN
INTRAMUSCULAR | Status: DC | PRN
  Administered 2013-09-07: 4 mg via INTRAVENOUS

## 2013-09-07 MED ORDER — MENTHOL 3 MG MT LOZG: 1.0000 | LOZENGE | OROMUCOSAL | Status: DC | PRN

## 2013-09-07 MED ORDER — SODIUM CHLORIDE 0.9 % IJ SOLN: 3.0000 mL | Freq: Two times a day (BID) | INTRAMUSCULAR | Status: DC

## 2013-09-07 MED ORDER — OLOPATADINE HCL 0.1 % OP SOLN
1.0000 [drp] | Freq: Two times a day (BID) | OPHTHALMIC | Status: DC
  Filled 2013-09-07: qty 5

## 2013-09-07 MED ORDER — BUPIVACAINE HCL (PF) 0.25 % IJ SOLN
INTRAMUSCULAR | Status: DC | PRN
  Administered 2013-09-07: 2 mL

## 2013-09-07 MED ORDER — FENTANYL CITRATE 0.05 MG/ML IJ SOLN
INTRAMUSCULAR | Status: AC
  Filled 2013-09-07: qty 5

## 2013-09-07 MED ORDER — SODIUM CHLORIDE 0.9 % IV SOLN: 250.0000 mL | INTRAVENOUS | Status: DC

## 2013-09-07 MED ORDER — ONDANSETRON HCL 4 MG/2ML IJ SOLN: 4.0000 mg | Freq: Four times a day (QID) | INTRAMUSCULAR | Status: DC | PRN

## 2013-09-07 MED ORDER — LACTATED RINGERS IV SOLN
INTRAVENOUS | Status: DC
  Administered 2013-09-07: 07:00:00 via INTRAVENOUS

## 2013-09-07 MED ORDER — PROPOFOL 10 MG/ML IV BOLUS
INTRAVENOUS | Status: DC | PRN
  Administered 2013-09-07: 150 mg via INTRAVENOUS

## 2013-09-07 MED ORDER — AMLODIPINE-OLMESARTAN 5-40 MG PO TABS: 1.0000 | ORAL_TABLET | Freq: Every day | ORAL | Status: DC

## 2013-09-07 MED ORDER — LIDOCAINE HCL (CARDIAC) 20 MG/ML IV SOLN
INTRAVENOUS | Status: DC | PRN
  Administered 2013-09-07: 80 mg via INTRAVENOUS

## 2013-09-07 MED ORDER — OXYCODONE HCL 5 MG/5ML PO SOLN: 5.0000 mg | Freq: Once | ORAL | Status: AC | PRN

## 2013-09-07 MED ORDER — METHOCARBAMOL 500 MG PO TABS
500.0000 mg | ORAL_TABLET | Freq: Four times a day (QID) | ORAL | Status: DC | PRN
  Filled 2013-09-07: qty 1

## 2013-09-07 MED ORDER — POLYETHYL GLYCOL-PROPYL GLYCOL 0.4-0.3 % OP SOLN: OPHTHALMIC | Status: DC | PRN

## 2013-09-07 MED ORDER — TRAMADOL HCL 50 MG PO TABS
50.0000 mg | ORAL_TABLET | Freq: Three times a day (TID) | ORAL | Status: DC
  Administered 2013-09-07: 50 mg via ORAL
  Filled 2013-09-07: qty 1

## 2013-09-07 MED ORDER — GLYCOPYRROLATE 0.2 MG/ML IJ SOLN
INTRAMUSCULAR | Status: DC | PRN
  Administered 2013-09-07: .7 mg via INTRAVENOUS

## 2013-09-07 MED ORDER — PANTOPRAZOLE SODIUM 40 MG PO TBEC: 40.0000 mg | DELAYED_RELEASE_TABLET | Freq: Every day | ORAL | Status: DC

## 2013-09-07 MED ORDER — ONDANSETRON HCL 4 MG/2ML IJ SOLN
INTRAMUSCULAR | Status: AC
  Filled 2013-09-07: qty 2

## 2013-09-07 MED ORDER — DORZOLAMIDE HCL 2 % OP SOLN
1.0000 [drp] | Freq: Two times a day (BID) | OPHTHALMIC | Status: DC
  Filled 2013-09-07: qty 10

## 2013-09-07 MED ORDER — ENOXAPARIN SODIUM 40 MG/0.4ML ~~LOC~~ SOLN
40.0000 mg | SUBCUTANEOUS | Status: DC
  Filled 2013-09-07: qty 0.4

## 2013-09-07 MED ORDER — LIDOCAINE-EPINEPHRINE 1 %-1:100000 IJ SOLN
INTRAMUSCULAR | Status: DC | PRN
  Administered 2013-09-07: 2 mL

## 2013-09-07 MED ORDER — BRIMONIDINE TARTRATE 0.2 % OP SOLN
1.0000 [drp] | Freq: Three times a day (TID) | OPHTHALMIC | Status: DC
  Filled 2013-09-07: qty 5

## 2013-09-07 SURGICAL SUPPLY — 42 items
BANDAGE ADHESIVE 1X3 (GAUZE/BANDAGES/DRESSINGS) ×8 IMPLANT
BLADE SURG 11 STRL SS (BLADE) ×2 IMPLANT
BLADE SURG ROTATE 9660 (MISCELLANEOUS) IMPLANT
CEMENT BONE KYPHX HV R (Orthopedic Implant) ×2 IMPLANT
CEMENT KYPHON C01A KIT/MIXER (Cement) ×2 IMPLANT
CONT SPEC 4OZ CLIKSEAL STRL BL (MISCELLANEOUS) ×4 IMPLANT
DECANTER SPIKE VIAL GLASS SM (MISCELLANEOUS) ×2 IMPLANT
DERMABOND ADVANCED (GAUZE/BANDAGES/DRESSINGS)
DERMABOND ADVANCED .7 DNX12 (GAUZE/BANDAGES/DRESSINGS) IMPLANT
DRAPE C-ARM 42X72 X-RAY (DRAPES) ×2 IMPLANT
DRAPE INCISE IOBAN 66X45 STRL (DRAPES) ×2 IMPLANT
DRAPE LAPAROTOMY 100X72X124 (DRAPES) ×2 IMPLANT
DRAPE PROXIMA HALF (DRAPES) ×2 IMPLANT
DURAPREP 26ML APPLICATOR (WOUND CARE) ×2 IMPLANT
GAUZE SPONGE 4X4 16PLY XRAY LF (GAUZE/BANDAGES/DRESSINGS) ×2 IMPLANT
GLOVE BIO SURGEON STRL SZ7.5 (GLOVE) IMPLANT
GLOVE BIOGEL PI IND STRL 7.5 (GLOVE) IMPLANT
GLOVE BIOGEL PI IND STRL 8.5 (GLOVE) ×1 IMPLANT
GLOVE BIOGEL PI INDICATOR 7.5 (GLOVE)
GLOVE BIOGEL PI INDICATOR 8.5 (GLOVE) ×1
GLOVE ECLIPSE 8.5 STRL (GLOVE) ×2 IMPLANT
GLOVE EXAM NITRILE LRG STRL (GLOVE) IMPLANT
GLOVE EXAM NITRILE MD LF STRL (GLOVE) IMPLANT
GLOVE EXAM NITRILE XL STR (GLOVE) IMPLANT
GLOVE EXAM NITRILE XS STR PU (GLOVE) IMPLANT
GOWN BRE IMP SLV AUR LG STRL (GOWN DISPOSABLE) IMPLANT
GOWN BRE IMP SLV AUR XL STRL (GOWN DISPOSABLE) ×2 IMPLANT
GOWN STRL REIN 2XL LVL4 (GOWN DISPOSABLE) ×2 IMPLANT
KIT BASIN OR (CUSTOM PROCEDURE TRAY) ×2 IMPLANT
KIT ROOM TURNOVER OR (KITS) ×2 IMPLANT
NEEDLE HYPO 25X1 1.5 SAFETY (NEEDLE) ×2 IMPLANT
NS IRRIG 1000ML POUR BTL (IV SOLUTION) ×2 IMPLANT
PACK SURGICAL SETUP 50X90 (CUSTOM PROCEDURE TRAY) ×2 IMPLANT
PAD ARMBOARD 7.5X6 YLW CONV (MISCELLANEOUS) ×6 IMPLANT
SPECIMEN JAR SMALL (MISCELLANEOUS) IMPLANT
SUT VIC AB 3-0 SH 8-18 (SUTURE) ×2 IMPLANT
SUT VIC AB 4-0 P-3 18X BRD (SUTURE) ×1 IMPLANT
SUT VIC AB 4-0 P3 18 (SUTURE) ×1
SYR CONTROL 10ML LL (SYRINGE) ×4 IMPLANT
TOWEL OR 17X24 6PK STRL BLUE (TOWEL DISPOSABLE) ×2 IMPLANT
TOWEL OR 17X26 10 PK STRL BLUE (TOWEL DISPOSABLE) ×2 IMPLANT
TRAY KYPHOPAK 20/3 ONESTEP 1ST (MISCELLANEOUS) ×2 IMPLANT

## 2013-09-07 NOTE — Progress Notes (Signed)
Pt and family given D/C instructions with verbal understanding of teaching. Pt D/C'd home via wheelchair @ 1650 per MD order. Pt is stable at D/C and has no other needs at this time. Rema Fendt, RN

## 2013-09-07 NOTE — Anesthesia Postprocedure Evaluation (Signed)
Anesthesia Post Note  Patient: Rhonda [name]  Procedure(s) Performed: Procedure(s) (LRB): L1 KYPHOPLASTY (N/A)  Anesthesia type: General  Patient location: PACU  Post pain: Pain level controlled and Adequate analgesia  Post assessment: Post-op Vital signs reviewed, Patient's Cardiovascular Status Stable, Respiratory Function Stable, Patent Airway and Pain level controlled  Last Vitals:  Filed Vitals:   09/07/13 1200  BP:   Pulse: 61  Temp:   Resp: 12    Post vital signs: Reviewed and stable  Level of consciousness: awake, alert  and oriented  Complications: No apparent anesthesia complications

## 2013-09-07 NOTE — Discharge Summary (Signed)
Physician Discharge Summary  Patient ID: Rhonda [name] MRN: [id number] DOB/AGE: 09-03-1929 79 y.o.  Admit date: 09/07/2013 Discharge date: 09/07/2013  Admission Diagnoses: L1 pathologic compression  Discharge Diagnoses: L1 pathologic compression fracture, status post L2 acrylic balloon kyphoplasty Active Problems:   L1 vertebral fracture   Discharged Condition: good  Hospital Course: Patient was admitted to undergo surgical acrylic balloon kyphoplasty of the L1 vertebrae. She tolerated procedure well. 2 weeks ago the patient had an L2 acrylic balloon kyphoplasty in essence fractured and additional vertebrae.  Consults: None  Significant Diagnostic Studies: None  Treatments: surgery: Acrylic balloon kyphoplasty L1  Discharge Exam: Blood pressure 166/70, pulse 60, temperature 99 F (37.2 C), temperature source Oral, resp. rate 18, SpO2 96.00%. Incision is clean and dry motor function is intact in lower extremities.  Disposition: 01-Home or Self Care  Discharge Orders   Future Appointments Provider Department Dept Phone   12/06/2013 9:40 AM [facility] [place] Office [phone]   Future Orders Complete By Expires   Call MD for:  redness, tenderness, or signs of infection (pain, swelling, redness, odor or green/yellow discharge around incision site)  As directed    Call MD for:  severe uncontrolled pain  As directed    Call MD for:  temperature >100.4  As directed    Diet - low sodium heart healthy  As directed    Discharge instructions  As directed    Comments:     Okay to shower. Do not apply salves or appointments to incision. No heavy lifting with the upper extremities greater than 15 pounds. May resume driving when not requiring pain medication and patient feels comfortable with doing so.   Increase activity slowly  As directed        Medication List         ALPHAGAN P 0.15 % ophthalmic solution  Generic drug:  brimonidine  Place 1  drop into the right eye 3 (three) times daily.     amLODipine-olmesartan 5-40 MG per tablet  Commonly known as:  AZOR  Take 1 tablet by mouth daily.     atorvastatin 40 MG tablet  Commonly known as:  LIPITOR  Take 40 mg by mouth daily.     dorzolamide 2 % ophthalmic solution  Commonly known as:  TRUSOPT  Place 1 drop into the right eye 2 (two) times daily.     enoxaparin 40 MG/0.4ML injection  Commonly known as:  LOVENOX  Inject 40 mg into the skin daily.     hydrALAZINE 50 MG tablet  Commonly known as:  APRESOLINE  Take 50 mg by mouth 2 (two) times daily.     INTEGRA 62.5-62.5-40-3 MG Caps  Take 1 capsule by mouth daily.     multivitamin with minerals Tabs tablet  Take 1 tablet by mouth daily.     nitroGLYCERIN 0.4 MG SL tablet  Commonly known as:  NITROSTAT  Place 0.4 mg under the tongue every 5 (five) minutes as needed. For chest pain.     OVER THE COUNTER MEDICATION  Take 1 tablet by mouth 3 (three) times daily. Calcium Magnesium Zinc Copper and vitamin d3     oxyCODONE 5 MG immediate release tablet  Commonly known as:  ROXICODONE  Take 1 tablet (5 mg total) by mouth every 6 (six) hours as needed for severe pain.     pantoprazole 40 MG tablet  Commonly known as:  PROTONIX  Take 40 mg by mouth daily.  PATADAY 0.2 % Soln  Generic drug:  Olopatadine HCl  Place 1 drop into both eyes daily.     SYSTANE OP  Place 1 drop into both eyes daily as needed (for dry eyes). For moisture.     traMADol 50 MG tablet  Commonly known as:  ULTRAM  Take 50 mg by mouth 3 (three) times daily.     TRAVATAN Z OP  Place 1 drop into the right eye at bedtime. Right Eye only     Vitamin D3 1000 UNITS Caps  Take 1 capsule by mouth daily.     warfarin 5 MG tablet  Commonly known as:  COUMADIN  Take 2.5-5 mg by mouth See admin instructions. Take as directed by coumadin clinic. Take 5mg  every day except on Thursday she takes half of the 5mg  tablet          Signed: [name],Rhonda [name] 09/07/2013, 2:56 PM

## 2013-09-07 NOTE — Op Note (Signed)
Date of surgery: 09/07/2013 Preoperative diagnosis: Pathologic fracture L1 Postoperative diagnosis: Pathologic fracture L1 status post pathologic fracture of L2 Procedure: Acrylic balloon kyphoplasty L1 Surgeon: Barnett Abu Anesthesia: General endotracheal Indications: Rhonda [name] is an 80 year old individual who 2 weeks ago underwent an acrylic balloon kyphoplasty of L2. Since then she has fractured another vertebrae and had acute severe worsening of pain. She notes that she like to undergo kyphoplasty as the first kyphoplasty had given her very prompt relief of significant pain.  Procedure: The patient was brought to the operating room supine on a stretcher. After the smooth induction of general endotracheal anesthesia, she was turned prone. The back was prepped with alcohol and DuraPrep. He was draped sterilely. Fluoroscopic guidance was used in the biplane fashion and then to localize L2. An area lateral to the pedicle on the left side of the skin was marked approximately 5 cm to the left of the pedicle. The skin was infiltrated with 1 cc lidocaine without epinephrine. A stab incision was created with a #11 blade. A Jamshidi needle was then inserted into the lateral aspect of the vertebral body through the transverse process and the pedicle. When the vertebral body was entered, the inner cannula was removed. A bone needle was inserted through this region. A balloon was then inserted and this could easily be inflated in the very soft bone of the L1 vertebrae. The pressure never increased above 120 mmHg and rapidly DKA to 70 mmHg with a total of 4 cc of contrast was inserted into the balloon. Cement was mixed. When it reached appropriate consistency, a total of 6 cc of cement was inserted into the vertebral body under fluoroscopic guidance. Once the cement was set, the outer cannula was removed. Good filling of the vertebral body across the midline and across the entire endplate of L1 was noted. The  patient tolerated the procedure well.

## 2013-09-07 NOTE — Anesthesia Procedure Notes (Signed)
Procedure Name: Intubation Date/Time: 09/07/2013 11:12 AM Performed by: Carmela Rima Pre-anesthesia Checklist: Patient identified, Timeout performed, Emergency Drugs available, Suction available and Patient being monitored Patient Re-evaluated:Patient Re-evaluated prior to inductionOxygen Delivery Method: Circle system utilized Preoxygenation: Pre-oxygenation with 100% oxygen Intubation Type: IV induction Ventilation: Mask ventilation without difficulty Laryngoscope Size: Mac and 3 Grade View: Grade I Tube type: Oral Tube size: 7.5 mm Number of attempts: 1 Placement Confirmation: positive ETCO2,  ETT inserted through vocal cords under direct vision and breath sounds checked- equal and bilateral Secured at: 23 cm Tube secured with: Tape Dental Injury: Teeth and Oropharynx as per pre-operative assessment

## 2013-09-07 NOTE — Transfer of Care (Signed)
Immediate Anesthesia Transfer of Care Note  Patient: Rhonda [name]  Procedure(s) Performed: Procedure(s): L1 KYPHOPLASTY (N/A)  Patient Location: PACU  Anesthesia Type:General  Level of Consciousness: awake, alert  and oriented  Airway & Oxygen Therapy: Patient Spontanous Breathing and Patient connected to nasal cannula oxygen  Post-op Assessment: Report given to PACU RN, Post -op Vital signs reviewed and stable and Patient moving all extremities X 4  Post vital signs: Reviewed and stable  Complications: No apparent anesthesia complications

## 2013-09-07 NOTE — H&P (Signed)
Rhonda [name] is an 80 y.o. female.   Chief Complaint: Recurrent back pain HPI: Patient is an 80 year old individual who just a month ago underwent a kyphoplasty for an L2 compression fracture. She was well for a few weeks but then suddenly developed severe recurrent pain. A new radiograph demonstrates a new L1 compression fracture. She is now tolerated conservative management very well and would like to undergo an L1 kyphoplasty. REVIEW OF SYSTEMS:                                    Notable for wearing of glasses, glaucoma, high blood pressure, irregular pulse, heart murmur, high cholesterol, back pain and leg pain on a 14-point review sheet noted in the office today.  PAST MEDICAL HISTORY:    Current Medical Conditions:  Significant for hypertension.  She has a history of atrial fibrillation, has a pacemaker in place, and also is on Coumadin.    Prior Operations:  Previous surgeries include angioplasty in 1993 and then coronary ablation in 2013.    Medications and Allergies:  SHE NOTES AN ALLERGY TO SULFA.  Her current medication list includes pantoprazole, atorvastatin, hydralazine, warfarin, Brimonidine tartrate, dorzolamide solution for the eyes, Travatan, another eye drop, calcium, multivitamins, vitamin D3, Tylenol, and tramadol.  SOCIAL HISTORY:                                            Personal history reveals that she does not smoke or use alcohol.  PHYSICAL EXAMINATION:                                Height and weight have been stable of 5 feet 6-1/2 inches and 124 pounds.  On physical examination, she stands with a slight forward hunch.  Palpation of her back reproduces significant pain in the midportion of the lumbar spine.  Percussion also reproduces substantial pain.  Her motor strength appears intact in the iliopsoas, quads, tibialis anterior, and gastrocs.  Deep tendon reflexes are 2+ in the biceps and triceps, trace in the patellae, and absent in the Achilles.  Babinski's are  downgoing. Past Medical History  Diagnosis Date  . PAF (paroxysmal atrial fibrillation)     takes Coumadin daily  . Glaucoma     uses Eye Drops daily  . LBBB (left bundle branch block)   . Anemia   . GI bleed     previously with pradaxa  . Hyperlipidemia     takes Atorvastatin daily  . Coronary disease     s/p PTCA 1990s  . 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 H/O hiatal hernia   . Hypertension     takes Azor and Apresoline daily  . GERD (gastroesophageal reflux disease)     takes Protonix daily  . Sore throat     since anesthesia in Feb 2015 [place]    Past Surgical History  Procedure Laterality Date  . Coronary angioplasty  1990s    by Dr Glade Lloyd  . Colonoscopy    . Esophagogastrectomy    . Tee without cardioversion  11/11/2011    Procedure: TRANSESOPHAGEAL ECHOCARDIOGRAM (TEE);  Surgeon: Pixie Casino, MD;  Location:  [facility];  Service: Cardiovascular;  Laterality: N/A;  . Atrial fibrillation ablation  11/12/11    PVI by Dr Rayann Heman  . Pacemaker insertion  11/13/11    MDT implanted by Dr Sallyanne Kuster for 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 observed on EP study  . Cataract extraction w/ intraocular lens  implant, bilateral    . Eye surgery      GLAUCOMA SURG LT EYE  . Kyphoplasty N/A 08/09/2013    Procedure: LUMBAR TWO KYPHOPLASTY;  Surgeon: Kristeen Miss, MD;  Location: [facility];  Service: Neurosurgery;  Laterality: N/A;  L2 Kyphoplasty    Family History  Problem Relation Age of Onset  . Hypertension     Social History:  reports that she has never smoked. She does not have any smokeless tobacco history on file. She reports that she does not drink alcohol or use illicit drugs.  Allergies:  Allergies  Allergen Reactions  . Azor [Amlodipine-Olmesartan] Other (See Comments)    Higher doses cause ankles to swell  . Beta Adrenergic Blockers     Bradycardia.  . Norvasc [Amlodipine Besylate]     edema  .  Pradaxa [Dabigatran Etexilate Mesylate] Other (See Comments)    GI Bleed  . Questran [Cholestyramine] Other (See Comments)    Constipation  . Sulfa Drugs Cross Reactors Hives    Medications Prior to Admission  Medication Sig Dispense Refill  . amLODipine-olmesartan (AZOR) 5-40 MG per tablet Take 1 tablet by mouth daily.      Marland Kitchen atorvastatin (LIPITOR) 40 MG tablet Take 40 mg by mouth daily.       . brimonidine (ALPHAGAN P) 0.15 % ophthalmic solution Place 1 drop into the right eye 3 (three) times daily.       . Cholecalciferol (VITAMIN D3) 1000 UNITS CAPS Take 1 capsule by mouth daily.      . dorzolamide (TRUSOPT) 2 % ophthalmic solution Place 1 drop into the right eye 2 (two) times daily.       Marland Kitchen enoxaparin (LOVENOX) 40 MG/0.4ML injection Inject 40 mg into the skin daily.      . Fe Fum-FePoly-Vit C-Vit B3 (INTEGRA) 62.5-62.5-40-3 MG CAPS Take 1 capsule by mouth daily.      . hydrALAZINE (APRESOLINE) 50 MG tablet Take 50 mg by mouth 2 (two) times daily.      . Multiple Vitamin (MULITIVITAMIN WITH MINERALS) TABS Take 1 tablet by mouth daily.      . Olopatadine HCl (PATADAY) 0.2 % SOLN Place 1 drop into both eyes daily.       Marland Kitchen OVER THE COUNTER MEDICATION Take 1 tablet by mouth 3 (three) times daily. Calcium Magnesium Zinc Copper and vitamin d3      . oxyCODONE (ROXICODONE) 5 MG immediate release tablet Take 1 tablet (5 mg total) by mouth every 6 (six) hours as needed for severe pain.  20 tablet  0  . pantoprazole (PROTONIX) 40 MG tablet Take 40 mg by mouth daily.       Vladimir Faster Glycol-Propyl Glycol (SYSTANE OP) Place 1 drop into both eyes daily as needed (for dry eyes). For moisture.      . traMADol (ULTRAM) 50 MG tablet Take 50 mg by mouth 3 (three) times daily.      . Travoprost (TRAVATAN Z OP) Place 1 drop into the right eye at bedtime. Right Eye only      . warfarin (COUMADIN) 5 MG tablet Take 2.5-5 mg by mouth See admin instructions. Take as directed  by coumadin clinic. Take 5m every  day except on Thursday she takes half of the 516mtablet      . nitroGLYCERIN (NITROSTAT) 0.4 MG SL tablet Place 0.4 mg under the tongue every 5 (five) minutes as needed. For chest pain.        Results for orders placed during the hospital encounter of 09/06/13 (from the past 48 hour(s))  SURGICAL PCR SCREEN     Status: None   Collection Time    09/06/13  1:08 PM      Result Value Ref Range   MRSA, PCR NEGATIVE  NEGATIVE   Staphylococcus aureus NEGATIVE  NEGATIVE   Comment:            The Xpert SA Assay (FDA     approved for NASAL specimens     in patients over 2141ears of age),     is one component of     a comprehensive surveillance     program.  Test performance has     been validated by [employer] patients greater     than or equal to 1 69ear old.     It is not intended     to diagnose infection nor to     guide or monitor treatment.  CBC     Status: Abnormal   Collection Time    09/06/13  1:08 PM      Result Value Ref Range   WBC 4.1  4.0 - 10.5 K/uL   RBC 3.79 (*) 3.87 - 5.11 MIL/uL   Hemoglobin 12.0  12.0 - 15.0 g/dL   HCT 34.5 (*) 36.0 - 46.0 %   MCV 91.0  78.0 - 100.0 fL   MCH 31.7  26.0 - 34.0 pg   MCHC 34.8  30.0 - 36.0 g/dL   RDW 12.8  11.5 - 15.5 %   Platelets 211  150 - 400 K/uL  BASIC METABOLIC PANEL     Status: Abnormal   Collection Time    09/06/13  1:08 PM      Result Value Ref Range   Sodium 140  137 - 147 mEq/L   Potassium 4.2  3.7 - 5.3 mEq/L   Chloride 103  96 - 112 mEq/L   CO2 26  19 - 32 mEq/L   Glucose, Bld 91  70 - 99 mg/dL   BUN 7  6 - 23 mg/dL   Creatinine, Ser 0.46 (*) 0.50 - 1.10 mg/dL   Calcium 10.1  8.4 - 10.5 mg/dL   GFR calc non Af Amer >90  >90 mL/min   GFR calc Af Amer >90  >90 mL/min   Comment: (NOTE)     The eGFR has been calculated using the CKD EPI equation.     This calculation has not been validated in all clinical situations.     eGFR's persistently <90 mL/min signify possible Chronic Kidney     Disease.   PROTIME-INR     Status: None   Collection Time    09/06/13  1:15 PM      Result Value Ref Range   Prothrombin Time 14.9  11.6 - 15.2 seconds   INR 1.20  0.00 - 1.49  APTT     Status: None   Collection Time    09/06/13  1:15 PM      Result Value Ref Range   aPTT 33  24 - 37 seconds   No results found.  Review of  Systems  Constitutional: Negative.   HENT: Negative.   Eyes: Negative.   Respiratory: Negative.   Cardiovascular:       Atrial fibrillation  Gastrointestinal: Negative.   Genitourinary: Negative.   Musculoskeletal: Positive for back pain.  Skin: Negative.   Neurological: Negative.   Endo/Heme/Allergies: Bruises/bleeds easily.  Psychiatric/Behavioral: Negative.     Blood pressure 170/57, pulse 81, temperature 98.4 F (36.9 C), temperature source Oral, resp. rate 18, SpO2 99.00%. Physical Exam  Constitutional: She is oriented to person, place, and time. She appears well-developed and well-nourished.  HENT:  Head: Normocephalic and atraumatic.  Eyes: Conjunctivae and EOM are normal. Pupils are equal, round, and reactive to light.  Neck: Neck supple.  GI: Soft. Bowel sounds are normal.  Genitourinary: Vagina normal and uterus normal.  Musculoskeletal: Normal range of motion.  Neurological: She is alert and oriented to person, place, and time.  Skin: Skin is warm and dry.  Psychiatric: She has a normal mood and affect. Her behavior is normal. Judgment and thought content normal.     Assessment/Plan L1 compression fracture, status post L2 pathologic fracture treated with kyphoplasty.  Admitted now for L1 acrylic balloon kyphoplasty.  Augustin Bun J 09/07/2013, 10:35 AM

## 2013-09-07 NOTE — Anesthesia Preprocedure Evaluation (Addendum)
Anesthesia Evaluation  Patient identified by MRN, date of birth, ID band Patient awake    Reviewed: Allergy & Precautions, H&P , NPO status , Patient's Chart, lab work & pertinent test results  Airway Mallampati: II  Neck ROM: full    Dental  (+) Dental Advidsory Given, Partial Upper   Pulmonary          Cardiovascular hypertension, + CAD + dysrhythmias Atrial Fibrillation + pacemaker     Neuro/Psych    GI/Hepatic hiatal hernia, GERD-  ,  Endo/Other    Renal/GU      Musculoskeletal   Abdominal   Peds  Hematology   Anesthesia Other Findings   Reproductive/Obstetrics                          Anesthesia Physical Anesthesia Plan  ASA: III  Anesthesia Plan: General   Post-op Pain Management:    Induction: Intravenous  Airway Management Planned: Oral ETT  Additional Equipment:   Intra-op Plan:   Post-operative Plan:   Informed Consent: I have reviewed the patients History and Physical, chart, labs and discussed the procedure including the risks, benefits and alternatives for the proposed anesthesia with the patient or authorized representative who has indicated his/her understanding and acceptance.   Dental Advisory Given  Plan Discussed with: CRNA, Anesthesiologist and Surgeon  Anesthesia Plan Comments:        Anesthesia Quick Evaluation

## 2013-09-07 NOTE — Preoperative (Signed)
Beta Blockers   Reason not to administer Beta Blockers:Not Applicable 

## 2013-09-09 ENCOUNTER — Encounter (HOSPITAL_COMMUNITY): Payer: Self-pay | Admitting: Neurological Surgery

## 2013-09-17 ENCOUNTER — Encounter: Payer: Self-pay | Admitting: *Deleted

## 2013-09-21 ENCOUNTER — Encounter: Payer: Self-pay | Admitting: Cardiovascular Disease

## 2013-09-30 ENCOUNTER — Other Ambulatory Visit: Payer: Self-pay | Admitting: Neurological Surgery

## 2013-10-01 ENCOUNTER — Encounter (HOSPITAL_COMMUNITY): Payer: Self-pay | Admitting: *Deleted

## 2013-10-03 MED ORDER — CEFAZOLIN SODIUM-DEXTROSE 2-3 GM-% IV SOLR
2.0000 g | INTRAVENOUS | Status: AC
  Administered 2013-10-04: 2 g via INTRAVENOUS
  Filled 2013-10-03: qty 50

## 2013-10-04 ENCOUNTER — Encounter (HOSPITAL_COMMUNITY): Payer: Self-pay | Admitting: Anesthesiology

## 2013-10-04 ENCOUNTER — Encounter (HOSPITAL_COMMUNITY): Payer: Medicare Other | Admitting: Anesthesiology

## 2013-10-04 ENCOUNTER — Ambulatory Visit (HOSPITAL_COMMUNITY): Payer: Medicare Other

## 2013-10-04 ENCOUNTER — Encounter (HOSPITAL_COMMUNITY)
Admission: RE | Disposition: A | Discharge status: Home / Self Care | Payer: Self-pay | Source: Ambulatory Visit | Attending: Neurological Surgery

## 2013-10-04 ENCOUNTER — Ambulatory Visit (HOSPITAL_COMMUNITY)
Admission: RE | Admit: 2013-10-04 | Discharge: 2013-10-04 | Disposition: A | Discharge status: Home / Self Care | Payer: Medicare Other | Source: Ambulatory Visit | Attending: Neurological Surgery | Admitting: Neurological Surgery

## 2013-10-04 ENCOUNTER — Ambulatory Visit (HOSPITAL_COMMUNITY): Payer: Medicare Other | Admitting: Anesthesiology

## 2013-10-04 DIAGNOSIS — K449 Diaphragmatic hernia without obstruction or gangrene: Secondary | ICD-10-CM | POA: Insufficient documentation

## 2013-10-04 DIAGNOSIS — I447 Left bundle-branch block, unspecified: Secondary | ICD-10-CM | POA: Insufficient documentation

## 2013-10-04 DIAGNOSIS — H409 Unspecified glaucoma: Secondary | ICD-10-CM | POA: Insufficient documentation

## 2013-10-04 DIAGNOSIS — Z7901 Long term (current) use of anticoagulants: Secondary | ICD-10-CM | POA: Insufficient documentation

## 2013-10-04 DIAGNOSIS — M4854XA Collapsed vertebra, not elsewhere classified, thoracic region, initial encounter for fracture: Secondary | ICD-10-CM | POA: Diagnosis present

## 2013-10-04 DIAGNOSIS — D649 Anemia, unspecified: Secondary | ICD-10-CM | POA: Insufficient documentation

## 2013-10-04 DIAGNOSIS — Z87311 Personal history of (healed) other pathological fracture: Secondary | ICD-10-CM | POA: Insufficient documentation

## 2013-10-04 DIAGNOSIS — K219 Gastro-esophageal reflux disease without esophagitis: Secondary | ICD-10-CM | POA: Insufficient documentation

## 2013-10-04 DIAGNOSIS — I10 Essential (primary) hypertension: Secondary | ICD-10-CM | POA: Insufficient documentation

## 2013-10-04 DIAGNOSIS — M8448XA Pathological fracture, other site, initial encounter for fracture: Secondary | ICD-10-CM | POA: Insufficient documentation

## 2013-10-04 DIAGNOSIS — M810 Age-related osteoporosis without current pathological fracture: Secondary | ICD-10-CM | POA: Insufficient documentation

## 2013-10-04 DIAGNOSIS — E785 Hyperlipidemia, unspecified: Secondary | ICD-10-CM | POA: Insufficient documentation

## 2013-10-04 DIAGNOSIS — Z9861 Coronary angioplasty status: Secondary | ICD-10-CM | POA: Insufficient documentation

## 2013-10-04 DIAGNOSIS — Z950 Presence of cardiac pacemaker: Secondary | ICD-10-CM | POA: Insufficient documentation

## 2013-10-04 DIAGNOSIS — I4891 Unspecified atrial fibrillation: Secondary | ICD-10-CM | POA: Insufficient documentation

## 2013-10-04 DIAGNOSIS — J029 Acute pharyngitis, unspecified: Secondary | ICD-10-CM | POA: Insufficient documentation

## 2013-10-04 HISTORY — PX: KYPHOPLASTY: SHX5884

## 2013-10-04 LAB — CBC
HCT: 37.1 % (ref 36.0–46.0)
Hemoglobin: 12.7 g/dL (ref 12.0–15.0)
MCH: 31 pg (ref 26.0–34.0)
MCHC: 34.2 g/dL (ref 30.0–36.0)
MCV: 90.5 fL (ref 78.0–100.0)
PLATELETS: 213 10*3/uL (ref 150–400)
RBC: 4.1 MIL/uL (ref 3.87–5.11)
RDW: 12.8 % (ref 11.5–15.5)
WBC: 4.2 10*3/uL (ref 4.0–10.5)

## 2013-10-04 LAB — PROTIME-INR
INR: 1.19 (ref 0.00–1.49)
PROTHROMBIN TIME: 14.8 s (ref 11.6–15.2)

## 2013-10-04 LAB — BASIC METABOLIC PANEL
BUN: 9 mg/dL (ref 6–23)
CHLORIDE: 102 meq/L (ref 96–112)
CO2: 25 meq/L (ref 19–32)
Calcium: 10 mg/dL (ref 8.4–10.5)
Creatinine, Ser: 0.46 mg/dL — ABNORMAL LOW (ref 0.50–1.10)
GFR calc Af Amer: >90 mL/min (ref >90)
GFR calc non Af Amer: >90 mL/min (ref >90)
GLUCOSE: 87 mg/dL (ref 70–99)
Potassium: 4.2 mEq/L (ref 3.7–5.3)
SODIUM: 141 meq/L (ref 137–147)

## 2013-10-04 LAB — APTT: APTT: 30 s (ref 24–37)

## 2013-10-04 SURGERY — KYPHOPLASTY
Anesthesia: General

## 2013-10-04 MED ORDER — KETOROLAC TROMETHAMINE 30 MG/ML IJ SOLN
INTRAMUSCULAR | Status: AC
  Administered 2013-10-04: 15 mg
  Filled 2013-10-04: qty 1

## 2013-10-04 MED ORDER — SODIUM CHLORIDE 0.9 % IJ SOLN: 3.0000 mL | Freq: Two times a day (BID) | INTRAMUSCULAR | Status: DC

## 2013-10-04 MED ORDER — OXYCODONE HCL 5 MG PO TABS
5.0000 mg | ORAL_TABLET | Freq: Four times a day (QID) | ORAL | Status: DC | PRN
  Filled 2013-10-04: qty 1

## 2013-10-04 MED ORDER — ALUM & MAG HYDROXIDE-SIMETH 200-200-20 MG/5ML PO SUSP: 30.0000 mL | Freq: Four times a day (QID) | ORAL | Status: DC | PRN

## 2013-10-04 MED ORDER — KETOROLAC TROMETHAMINE 15 MG/ML IJ SOLN: 15.0000 mg | Freq: Four times a day (QID) | INTRAMUSCULAR | Status: DC

## 2013-10-04 MED ORDER — NEOSTIGMINE METHYLSULFATE 1 MG/ML IJ SOLN
INTRAMUSCULAR | Status: DC | PRN
  Administered 2013-10-04: 3 mg via INTRAVENOUS

## 2013-10-04 MED ORDER — GLYCOPYRROLATE 0.2 MG/ML IJ SOLN
INTRAMUSCULAR | Status: AC
  Filled 2013-10-04: qty 2

## 2013-10-04 MED ORDER — PHENOL 1.4 % MT LIQD
1.0000 | OROMUCOSAL | Status: DC | PRN
Start: 2013-10-04 — End: 2013-10-05

## 2013-10-04 MED ORDER — OXYCODONE HCL 5 MG PO TABS: 5.0000 mg | ORAL_TABLET | Freq: Once | ORAL | Status: DC | PRN

## 2013-10-04 MED ORDER — ROCURONIUM BROMIDE 100 MG/10ML IV SOLN
INTRAVENOUS | Status: DC | PRN
  Administered 2013-10-04: 20 mg via INTRAVENOUS

## 2013-10-04 MED ORDER — ACETAMINOPHEN 325 MG PO TABS: 650.0000 mg | ORAL_TABLET | ORAL | Status: DC | PRN

## 2013-10-04 MED ORDER — ONDANSETRON HCL 4 MG/2ML IJ SOLN: 4.0000 mg | INTRAMUSCULAR | Status: DC | PRN

## 2013-10-04 MED ORDER — BUPIVACAINE HCL (PF) 0.25 % IJ SOLN
INTRAMUSCULAR | Status: DC | PRN
  Administered 2013-10-04: 1 mL

## 2013-10-04 MED ORDER — PROPOFOL 10 MG/ML IV BOLUS
INTRAVENOUS | Status: DC | PRN
  Administered 2013-10-04: 170 mg via INTRAVENOUS

## 2013-10-04 MED ORDER — ARTIFICIAL TEARS OP OINT
TOPICAL_OINTMENT | OPHTHALMIC | Status: DC | PRN
  Administered 2013-10-04: 1 via OPHTHALMIC

## 2013-10-04 MED ORDER — PROPOFOL 10 MG/ML IV BOLUS
INTRAVENOUS | Status: AC
  Filled 2013-10-04: qty 20

## 2013-10-04 MED ORDER — SODIUM CHLORIDE 0.9 % IV SOLN: 250.0000 mL | INTRAVENOUS | Status: DC

## 2013-10-04 MED ORDER — ACETAMINOPHEN 650 MG RE SUPP: 650.0000 mg | RECTAL | Status: DC | PRN

## 2013-10-04 MED ORDER — IRBESARTAN 300 MG PO TABS: 300.0000 mg | ORAL_TABLET | Freq: Every day | ORAL | Status: DC

## 2013-10-04 MED ORDER — HYDROMORPHONE HCL PF 1 MG/ML IJ SOLN: 0.2500 mg | INTRAMUSCULAR | Status: DC | PRN

## 2013-10-04 MED ORDER — AMLODIPINE-OLMESARTAN 5-40 MG PO TABS: 1.0000 | ORAL_TABLET | Freq: Every day | ORAL | Status: DC

## 2013-10-04 MED ORDER — CEFAZOLIN SODIUM 1-5 GM-% IV SOLN: 1.0000 g | Freq: Three times a day (TID) | INTRAVENOUS | Status: DC

## 2013-10-04 MED ORDER — GLYCOPYRROLATE 0.2 MG/ML IJ SOLN
INTRAMUSCULAR | Status: DC | PRN
  Administered 2013-10-04: 0.4 mg via INTRAVENOUS

## 2013-10-04 MED ORDER — DIAZEPAM 5 MG PO TABS: 5.0000 mg | ORAL_TABLET | Freq: Four times a day (QID) | ORAL | Status: DC | PRN

## 2013-10-04 MED ORDER — SODIUM CHLORIDE 0.9 % IJ SOLN: 3.0000 mL | INTRAMUSCULAR | Status: DC | PRN

## 2013-10-04 MED ORDER — METOCLOPRAMIDE HCL 5 MG/ML IJ SOLN: 10.0000 mg | Freq: Once | INTRAMUSCULAR | Status: DC | PRN

## 2013-10-04 MED ORDER — FENTANYL CITRATE 0.05 MG/ML IJ SOLN
INTRAMUSCULAR | Status: AC
  Filled 2013-10-04: qty 5

## 2013-10-04 MED ORDER — ONDANSETRON HCL 4 MG/2ML IJ SOLN
INTRAMUSCULAR | Status: DC | PRN
  Administered 2013-10-04: 4 mg via INTRAVENOUS

## 2013-10-04 MED ORDER — HYDRALAZINE HCL 50 MG PO TABS
50.0000 mg | ORAL_TABLET | Freq: Two times a day (BID) | ORAL | Status: DC
  Filled 2013-10-04: qty 1

## 2013-10-04 MED ORDER — ARTIFICIAL TEARS OP OINT
TOPICAL_OINTMENT | OPHTHALMIC | Status: AC
  Filled 2013-10-04: qty 3.5

## 2013-10-04 MED ORDER — MENTHOL 3 MG MT LOZG: 1.0000 | LOZENGE | OROMUCOSAL | Status: DC | PRN

## 2013-10-04 MED ORDER — DEXTROSE 5 % IV SOLN: 500.0000 mg | Freq: Four times a day (QID) | INTRAVENOUS | Status: DC | PRN

## 2013-10-04 MED ORDER — ONDANSETRON HCL 4 MG/2ML IJ SOLN
INTRAMUSCULAR | Status: AC
  Filled 2013-10-04: qty 2

## 2013-10-04 MED ORDER — NEOSTIGMINE METHYLSULFATE 1 MG/ML IJ SOLN
INTRAMUSCULAR | Status: AC
  Filled 2013-10-04: qty 10

## 2013-10-04 MED ORDER — LACTATED RINGERS IV SOLN
INTRAVENOUS | Status: DC
  Administered 2013-10-04: 12:00:00 via INTRAVENOUS

## 2013-10-04 MED ORDER — AMLODIPINE BESYLATE 5 MG PO TABS
5.0000 mg | ORAL_TABLET | Freq: Every day | ORAL | Status: DC
Start: 2013-10-05 — End: 2013-10-05

## 2013-10-04 MED ORDER — ROCURONIUM BROMIDE 50 MG/5ML IV SOLN
INTRAVENOUS | Status: AC
  Filled 2013-10-04: qty 1

## 2013-10-04 MED ORDER — LIDOCAINE-EPINEPHRINE 1 %-1:100000 IJ SOLN
INTRAMUSCULAR | Status: DC | PRN
  Administered 2013-10-04: 1 mL

## 2013-10-04 MED ORDER — LIDOCAINE HCL (CARDIAC) 20 MG/ML IV SOLN
INTRAVENOUS | Status: DC | PRN
  Administered 2013-10-04: 50 mg via INTRAVENOUS

## 2013-10-04 MED ORDER — LACTATED RINGERS IV SOLN
INTRAVENOUS | Status: DC | PRN
Start: 2013-10-04 — End: 2013-10-04
  Administered 2013-10-04: 14:00:00 via INTRAVENOUS

## 2013-10-04 MED ORDER — FENTANYL CITRATE 0.05 MG/ML IJ SOLN
INTRAMUSCULAR | Status: DC | PRN
  Administered 2013-10-04: 100 ug via INTRAVENOUS

## 2013-10-04 MED ORDER — DEXAMETHASONE SODIUM PHOSPHATE 4 MG/ML IJ SOLN
INTRAMUSCULAR | Status: DC | PRN
  Administered 2013-10-04: 4 mg via INTRAVENOUS

## 2013-10-04 MED ORDER — OXYCODONE HCL 5 MG/5ML PO SOLN: 5.0000 mg | Freq: Once | ORAL | Status: DC | PRN

## 2013-10-04 MED ORDER — METHOCARBAMOL 500 MG PO TABS: 500.0000 mg | ORAL_TABLET | Freq: Four times a day (QID) | ORAL | Status: DC | PRN

## 2013-10-04 SURGICAL SUPPLY — 44 items
BANDAGE ADHESIVE 1X3 (GAUZE/BANDAGES/DRESSINGS) ×8 IMPLANT
BLADE SURG 11 STRL SS (BLADE) ×2 IMPLANT
BLADE SURG ROTATE 9660 (MISCELLANEOUS) IMPLANT
CEMENT BONE KYPHX HV R (Orthopedic Implant) ×2 IMPLANT
CONT SPEC 4OZ CLIKSEAL STRL BL (MISCELLANEOUS) ×4 IMPLANT
DECANTER SPIKE VIAL GLASS SM (MISCELLANEOUS) ×2 IMPLANT
DERMABOND ADHESIVE PROPEN (GAUZE/BANDAGES/DRESSINGS) ×1
DERMABOND ADVANCED (GAUZE/BANDAGES/DRESSINGS)
DERMABOND ADVANCED .7 DNX12 (GAUZE/BANDAGES/DRESSINGS) IMPLANT
DERMABOND ADVANCED .7 DNX6 (GAUZE/BANDAGES/DRESSINGS) ×1 IMPLANT
DEVICE BIOPSY BONE KYPHX (INSTRUMENTS) ×2 IMPLANT
DRAPE C-ARM 42X72 X-RAY (DRAPES) ×2 IMPLANT
DRAPE INCISE IOBAN 66X45 STRL (DRAPES) ×2 IMPLANT
DRAPE LAPAROTOMY 100X72X124 (DRAPES) ×2 IMPLANT
DRAPE PROXIMA HALF (DRAPES) ×2 IMPLANT
DURAPREP 26ML APPLICATOR (WOUND CARE) ×2 IMPLANT
GAUZE SPONGE 4X4 16PLY XRAY LF (GAUZE/BANDAGES/DRESSINGS) ×2 IMPLANT
GLOVE BIO SURGEON STRL SZ7.5 (GLOVE) IMPLANT
GLOVE BIOGEL PI IND STRL 7.5 (GLOVE) IMPLANT
GLOVE BIOGEL PI IND STRL 8.5 (GLOVE) ×1 IMPLANT
GLOVE BIOGEL PI INDICATOR 7.5 (GLOVE)
GLOVE BIOGEL PI INDICATOR 8.5 (GLOVE) ×1
GLOVE ECLIPSE 8.5 STRL (GLOVE) ×2 IMPLANT
GLOVE EXAM NITRILE LRG STRL (GLOVE) IMPLANT
GLOVE EXAM NITRILE MD LF STRL (GLOVE) IMPLANT
GLOVE EXAM NITRILE XL STR (GLOVE) IMPLANT
GLOVE EXAM NITRILE XS STR PU (GLOVE) IMPLANT
GOWN BRE IMP SLV AUR LG STRL (GOWN DISPOSABLE) IMPLANT
GOWN BRE IMP SLV AUR XL STRL (GOWN DISPOSABLE) ×2 IMPLANT
GOWN STRL REIN 2XL LVL4 (GOWN DISPOSABLE) ×2 IMPLANT
KIT BASIN OR (CUSTOM PROCEDURE TRAY) ×2 IMPLANT
KIT ROOM TURNOVER OR (KITS) ×2 IMPLANT
NEEDLE HYPO 25X1 1.5 SAFETY (NEEDLE) ×2 IMPLANT
NS IRRIG 1000ML POUR BTL (IV SOLUTION) ×2 IMPLANT
PACK SURGICAL SETUP 50X90 (CUSTOM PROCEDURE TRAY) ×2 IMPLANT
PAD ARMBOARD 7.5X6 YLW CONV (MISCELLANEOUS) ×6 IMPLANT
SPECIMEN JAR SMALL (MISCELLANEOUS) IMPLANT
SUT VIC AB 3-0 SH 8-18 (SUTURE) ×2 IMPLANT
SUT VIC AB 4-0 P-3 18X BRD (SUTURE) ×1 IMPLANT
SUT VIC AB 4-0 P3 18 (SUTURE) ×1
SYR CONTROL 10ML LL (SYRINGE) ×4 IMPLANT
TOWEL OR 17X24 6PK STRL BLUE (TOWEL DISPOSABLE) ×2 IMPLANT
TOWEL OR 17X26 10 PK STRL BLUE (TOWEL DISPOSABLE) ×2 IMPLANT
TRAY KYPHOPAK 20/3 ONESTEP 1ST (MISCELLANEOUS) ×2 IMPLANT

## 2013-10-04 NOTE — Anesthesia Postprocedure Evaluation (Signed)
Anesthesia Post Note  Patient: Rhonda [name]  Procedure(s) Performed: Procedure(s) (LRB): T12 KYPHOPLASTY (N/A)  Anesthesia type: General  Patient location: PACU  Post pain: Pain level controlled  Post assessment: Patient's Cardiovascular Status Stable  Last Vitals:  Filed Vitals:   10/04/13 1635  BP: 182/78  Pulse: 90  Temp:   Resp: 14    Post vital signs: Reviewed and stable  Level of consciousness: alert  Complications: No apparent anesthesia complications

## 2013-10-04 NOTE — Op Note (Signed)
Date of surgery: 10/04/2013 Preoperative diagnosis: T12 compression fracture, osteoporosis Post operative diagnosis: T12 compression fracture, osteoporosis status post L1 and L2 fractures. Procedure: T12 acrylic balloon kyphoplasty Surgeon: Sherilyn Cooter Elby Blackwelder,MD Indications: Patient is an 80 year old individual who has had significant osteoporotic compression fractures including L1 and L2 fractures she now has a T12 fracture she has severe pain that has been intractable for the past week's time.  Procedure: The patient was brought to the operating room supine on a stretcher. After the smooth induction of general endotracheal anesthesia the patient was carefully positioned in the prone position on the operating table with the bony prominences appropriately padded and protected. Overalls were used under the patient's chest back was prepped with alcohol and DuraPrep and draped in a sterile fashion and then biplane fluoroscopy was used to isolate the T12 vertebrae. A pedicle entry site for this her vertebrae was chosen 4 cm lateral to the pedicle at the 9:00 position. A Jamshidi needle was inserted into the pedicle via the transfer radicular transvertebral approach. Biplane fluoroscopy was used during this procedure. Then the cannula was in the vertebral body the inner cannula was removed and a bone drill was used to create a space for the balloon. Balloon was then inflated 150 mmHg and was noted to deteriorate to 50 millimeters of pressure. 5Cc of dye was injected into the balloon. The  cement  was mixed to appropriate consistency and then injected into the vertebral body under biplane fluoroscopy until complete filling was achieved. A total of 6cc of cement was injected.  Final fluoroscopic images were obtained the Jamshidi needle was removed the incision was closed with a singular 3-0 Vicryl stitch and the patient was then returned to the recovery room in stable condition.

## 2013-10-04 NOTE — Progress Notes (Addendum)
Per Dovona in Neuro OR have [employer] Rep arrive at 14:00 for pacemaker reprogramming per device orders on chart. Spoke with Leta Jungling requested we contact her when patient arrives in Neuro holding number on chart. Dovona in Neuro OR notified of same.

## 2013-10-04 NOTE — Plan of Care (Signed)
Problem: Consults Goal: Diagnosis - Spinal Surgery Outcome: Completed/Met Date Met:  10/04/13 T12 KYPHOPLASTY

## 2013-10-04 NOTE — Progress Notes (Addendum)
Pt. Alert and oriented,follows simple instructions, denies pain. Incision area without swelling, redness or S/S of infection. Voiding adequate clear yellow urine. Moving all extremities well and vitals stable and documented. Patient discharged home with family. Lumbar surgery notes instructions given to patient and family member for home safety and precautions. Pt. and family stated understanding of instructions given and patient stated she will take BP medication at home before bed. Aisha RN

## 2013-10-04 NOTE — Progress Notes (Signed)
Patient ID: Rhonda [name], female   DOB: 07-18-1929, 80 y.o.   MRN: [id number] Seems more comfortable. Mobilizing a bit better. Definitely seems to have less pain. Discharged home this evening

## 2013-10-04 NOTE — Anesthesia Preprocedure Evaluation (Signed)
Anesthesia Evaluation  Patient identified by MRN, date of birth, ID band Patient awake    Reviewed: Allergy & Precautions, H&P , NPO status , Patient's Chart, lab work & pertinent test results, reviewed documented beta blocker date and time   Airway Mallampati: II TM Distance: >3 FB Neck ROM: full    Dental   Pulmonary neg pulmonary ROS,  breath sounds clear to auscultation        Cardiovascular hypertension, On Medications + CAD + dysrhythmias Atrial Fibrillation + pacemaker Rhythm:regular     Neuro/Psych negative neurological ROS  negative psych ROS   GI/Hepatic Neg liver ROS, hiatal hernia, GERD-  Medicated and Controlled,  Endo/Other  negative endocrine ROS  Renal/GU negative Renal ROS  negative genitourinary   Musculoskeletal   Abdominal   Peds  Hematology  (+) anemia ,   Anesthesia Other Findings See surgeon's H&P   Reproductive/Obstetrics negative OB ROS                           Anesthesia Physical Anesthesia Plan  ASA: III  Anesthesia Plan: General   Post-op Pain Management:    Induction: Intravenous  Airway Management Planned: Oral ETT  Additional Equipment:   Intra-op Plan:   Post-operative Plan: Extubation in OR  Informed Consent: I have reviewed the patients History and Physical, chart, labs and discussed the procedure including the risks, benefits and alternatives for the proposed anesthesia with the patient or authorized representative who has indicated his/her understanding and acceptance.   Dental Advisory Given  Plan Discussed with: CRNA and Surgeon  Anesthesia Plan Comments:         Anesthesia Quick Evaluation

## 2013-10-04 NOTE — Discharge Summary (Signed)
Physician Discharge Summary  Patient ID: Rhonda PitcherFrances C Guard MRN: [id number] DOB/AGE: 1930-03-04 80 y.o.  Admit date: 10/04/2013 Discharge date: 10/04/2013  Admission Diagnoses: T12 compression fracture, osteoporosis  Discharge Diagnoses: T12 compression fracture, osteoporosis  Active Problems:   Compression fracture of thoracic vertebrae, non-traumatic   Discharged Condition: fair  Hospital Course: Patient was admitted for acrylic balloon kyphoplasty. She tolerated it well  Consults: None  Significant Diagnostic Studies: None  Treatments: surgery: Acrylic balloon kyphoplasty T12  Discharge Exam: Blood pressure 180/89, pulse 73, temperature 97.9 F (36.6 C), temperature source Oral, resp. rate 16, height 5' 6.5" (1.689 m), weight 51.71 kg (114 lb), SpO2 97.00%. Motor function is intact in lower extremities.  Disposition: 01-Home or Self Care  Discharge Orders   Future Appointments Provider Department Dept Phone   12/22/2013 10:45 AM Hillis [facility]James Allred[facility] [address] [facility] [phone]   Future Orders Complete By Expires   Call MD for:  redness, tenderness, or signs of infection (pain, swelling, redness, odor or green/yellow discharge around incision site)  As directed    Call MD for:  severe uncontrolled pain  As directed    Call MD for:  temperature >100.4  As directed    Diet - low sodium heart healthy  As directed    Increase activity slowly  As directed        Medication List         ALPHAGAN P 0.15 % ophthalmic solution  Generic drug:  brimonidine  Place 1 drop into the right eye 3 (three) times daily.     amLODipine-olmesartan 5-40 MG per tablet  Commonly known as:  AZOR  Take 1 tablet by mouth daily.     atorvastatin 40 MG tablet  Commonly known as:  LIPITOR  Take 40 mg by mouth daily.     diazepam 5 MG tablet  Commonly known as:  VALIUM  Take 1 tablet (5 mg total) by mouth every 6 (six) hours as needed for anxiety or muscle spasms.     dorzolamide 2 % ophthalmic solution  Commonly known as:  TRUSOPT  Place 1 drop into the right eye 2 (two) times daily.     hydrALAZINE 50 MG tablet  Commonly known as:  APRESOLINE  Take 50 mg by mouth 2 (two) times daily.     INTEGRA 62.5-62.5-40-3 MG Caps  Take 1 capsule by mouth daily.     methocarbamol 750 MG tablet  Commonly known as:  ROBAXIN  Take 750 mg by mouth 4 (four) times daily.     multivitamin with minerals Tabs tablet  Take 1 tablet by mouth daily.     nitroGLYCERIN 0.4 MG SL tablet  Commonly known as:  NITROSTAT  Place 0.4 mg under the tongue every 5 (five) minutes as needed. For chest pain.     OVER THE COUNTER MEDICATION  Take 1 tablet by mouth 3 (three) times daily. Calcium Magnesium Zinc Copper and vitamin d3     oxyCODONE 5 MG immediate release tablet  Commonly known as:  ROXICODONE  Take 1 tablet (5 mg total) by mouth every 6 (six) hours as needed for severe pain.     pantoprazole 40 MG tablet  Commonly known as:  PROTONIX  Take 40 mg by mouth daily.     PATADAY 0.2 % Soln  Generic drug:  Olopatadine HCl  Place 1 drop into both eyes daily.     SYSTANE OP  Place 1 drop into both eyes daily as needed (  for dry eyes). For moisture.     traMADol 50 MG tablet  Commonly known as:  ULTRAM  Take 50 mg by mouth 3 (three) times daily.     TRAVATAN Z OP  Place 1 drop into the right eye at bedtime. Right Eye only     Vitamin D3 1000 UNITS Caps  Take 1,000 Units by mouth daily.     warfarin 5 MG tablet  Commonly known as:  COUMADIN  Take 2.5-5 mg by mouth See admin instructions. Takes 5 mg every day except 2.5 mg on Thursday         Signed: Nashia Remus J 10/04/2013, 7:04 PM

## 2013-10-04 NOTE — H&P (Signed)
Rhonda [name] is an 80 y.o. female.   Chief Complaint: Back pain HPI: Patient is an 80 year old individual who has recurring back pain in the thoracic lumbar junction. Over a month ago she sustained a fracture of the L2 vertebrae. This was treated with an acrylic kyphoplasty a few weeks later she developed a fracture of the L1 vertebrae and this was also treated with acrylic kyphoplasty and the patient a properly then last week she developed a severe acute onset of further pain will higher upper thoracic lumbar junction and again she is found to have a T12 compression fracture. She wishes to undergo an acrylic balloon kyphoplasty area she's been seen by her primary care specialist to treat her for fairly advanced osteoporosis.  Past Medical History  Diagnosis Date  . PAF (paroxysmal atrial fibrillation)     takes Coumadin daily  . Glaucoma     uses Eye Drops daily  . LBBB (left bundle branch block)   . Anemia   . GI bleed     previously with pradaxa  . Hyperlipidemia     takes Atorvastatin daily  . Coronary disease     s/p PTCA 1990s  . 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 H/O hiatal hernia   . Hypertension     takes Azor and Apresoline daily  . GERD (gastroesophageal reflux disease)     takes Protonix daily  . Sore throat     since anesthesia in Feb 2015 [place]    Past Surgical History  Procedure Laterality Date  . Coronary angioplasty  1990s    by Dr Glade Lloyd  . Colonoscopy    . Esophagogastrectomy    . Tee without cardioversion  11/11/2011    Procedure: TRANSESOPHAGEAL ECHOCARDIOGRAM (TEE);  Surgeon: Pixie Casino, MD;  Location: [facility] ENDOSCOPY;  Service: Cardiovascular;  Laterality: N/A;  . Atrial fibrillation ablation  11/12/11    PVI by Dr Rayann Heman  . Pacemaker insertion  11/13/11    MDT implanted by Dr Sallyanne Kuster for 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 observed on EP study  . Cataract extraction w/ intraocular lens   implant, bilateral    . Eye surgery      GLAUCOMA SURG LT EYE  . Kyphoplasty N/A 08/09/2013    Procedure: LUMBAR TWO KYPHOPLASTY;  Surgeon: Kristeen Miss, MD;  Location: [facility] NEURO ORS;  Service: Neurosurgery;  Laterality: N/A;  L2 Kyphoplasty  . Kyphoplasty N/A 09/07/2013    Procedure: L1 KYPHOPLASTY;  Surgeon: Kristeen Miss, MD;  Location: [facility] NEURO ORS;  Service: Neurosurgery;  Laterality: N/A;    Family History  Problem Relation Age of Onset  . Hypertension     Social History:  reports that she has never smoked. She does not have any smokeless tobacco history on file. She reports that she does not drink alcohol or use illicit drugs.  Allergies:  Allergies  Allergen Reactions  . Azor [Amlodipine-Olmesartan] Other (See Comments)    Higher doses cause ankles to swell  . Beta Adrenergic Blockers     Bradycardia.  . Norvasc [Amlodipine Besylate]     edema  . Pradaxa [Dabigatran Etexilate Mesylate] Other (See Comments)    GI Bleed  . Questran [Cholestyramine] Other (See Comments)    Constipation  . Sulfa Drugs Cross Reactors Hives    Medications Prior to Admission  Medication Sig Dispense Refill  . amLODipine-olmesartan (AZOR) 5-40 MG per tablet Take 1 tablet by  mouth daily.      Marland Kitchen atorvastatin (LIPITOR) 40 MG tablet Take 40 mg by mouth daily.       . brimonidine (ALPHAGAN P) 0.15 % ophthalmic solution Place 1 drop into the right eye 3 (three) times daily.       . Cholecalciferol (VITAMIN D3) 1000 UNITS CAPS Take 1,000 Units by mouth daily.       . dorzolamide (TRUSOPT) 2 % ophthalmic solution Place 1 drop into the right eye 2 (two) times daily.       . hydrALAZINE (APRESOLINE) 50 MG tablet Take 50 mg by mouth 2 (two) times daily.      . methocarbamol (ROBAXIN) 750 MG tablet Take 750 mg by mouth 4 (four) times daily.      . Multiple Vitamin (MULITIVITAMIN WITH MINERALS) TABS Take 1 tablet by mouth daily.      . Olopatadine HCl (PATADAY) 0.2 % SOLN Place 1 drop into both eyes daily.        Marland Kitchen OVER THE COUNTER MEDICATION Take 1 tablet by mouth 3 (three) times daily. Calcium Magnesium Zinc Copper and vitamin d3      . oxyCODONE (ROXICODONE) 5 MG immediate release tablet Take 1 tablet (5 mg total) by mouth every 6 (six) hours as needed for severe pain.  20 tablet  0  . pantoprazole (PROTONIX) 40 MG tablet Take 40 mg by mouth daily.       Vladimir Faster Glycol-Propyl Glycol (SYSTANE OP) Place 1 drop into both eyes daily as needed (for dry eyes). For moisture.      . traMADol (ULTRAM) 50 MG tablet Take 50 mg by mouth 3 (three) times daily.      . Travoprost (TRAVATAN Z OP) Place 1 drop into the right eye at bedtime. Right Eye only      . warfarin (COUMADIN) 5 MG tablet Take 2.5-5 mg by mouth See admin instructions. Takes 5 mg every day except 2.5 mg on Thursday      . Fe Fum-FePoly-Vit C-Vit B3 (INTEGRA) 62.5-62.5-40-3 MG CAPS Take 1 capsule by mouth daily.      . nitroGLYCERIN (NITROSTAT) 0.4 MG SL tablet Place 0.4 mg under the tongue every 5 (five) minutes as needed. For chest pain.        Results for orders placed during the hospital encounter of 10/04/13 (from the past 48 hour(s))  CBC     Status: None   Collection Time    10/04/13 11:33 AM      Result Value Ref Range   WBC 4.2  4.0 - 10.5 K/uL   RBC 4.10  3.87 - 5.11 MIL/uL   Hemoglobin 12.7  12.0 - 15.0 g/dL   HCT 37.1  36.0 - 46.0 %   MCV 90.5  78.0 - 100.0 fL   MCH 31.0  26.0 - 34.0 pg   MCHC 34.2  30.0 - 36.0 g/dL   RDW 12.8  11.5 - 15.5 %   Platelets 213  150 - 400 K/uL  BASIC METABOLIC PANEL     Status: Abnormal   Collection Time    10/04/13 11:33 AM      Result Value Ref Range   Sodium 141  137 - 147 mEq/L   Potassium 4.2  3.7 - 5.3 mEq/L   Chloride 102  96 - 112 mEq/L   CO2 25  19 - 32 mEq/L   Glucose, Bld 87  70 - 99 mg/dL   BUN 9  6 - 23 mg/dL  Creatinine, Ser 0.46 (*) 0.50 - 1.10 mg/dL   Calcium 10.0  8.4 - 10.5 mg/dL   GFR calc non Af Amer >90  >90 mL/min   GFR calc Af Amer >90  >90 mL/min   Comment:  (NOTE)     The eGFR has been calculated using the CKD EPI equation.     This calculation has not been validated in all clinical situations.     eGFR's persistently <90 mL/min signify possible Chronic Kidney     Disease.  APTT     Status: None   Collection Time    10/04/13 11:33 AM      Result Value Ref Range   aPTT 30  24 - 37 seconds  PROTIME-INR     Status: None   Collection Time    10/04/13 11:33 AM      Result Value Ref Range   Prothrombin Time 14.8  11.6 - 15.2 seconds   INR 1.19  0.00 - 1.49   No results found.  Review of Systems  HENT: Negative.   Respiratory: Negative.   Cardiovascular: Negative.   Gastrointestinal: Negative.   Musculoskeletal: Positive for back pain.  Skin: Negative.   Neurological: Positive for weakness.  Endo/Heme/Allergies: Negative.   Psychiatric/Behavioral: Negative.     Blood pressure 164/57, pulse 84, temperature 97.7 F (36.5 C), temperature source Oral, height 5' 6.5" (1.689 m), weight 51.71 kg (114 lb), SpO2 100.00%. Physical Exam  Constitutional: She is oriented to person, place, and time. She appears well-developed and well-nourished.  HENT:  Head: Normocephalic and atraumatic.  Eyes: Conjunctivae and EOM are normal. Pupils are equal, round, and reactive to light.  Neck: Normal range of motion. Neck supple.  Cardiovascular: Normal rate and regular rhythm.   Respiratory: Effort normal and breath sounds normal.  GI: Soft. Bowel sounds are normal.  Musculoskeletal:  Moderate thoracolumbar back pain.  Neurological: She is alert and oriented to person, place, and time.  Skin: Skin is warm and dry.  Psychiatric: She has a normal mood and affect. Her behavior is normal. Judgment and thought content normal.     Assessment/Plan T12 compression fracture secondary to osteoporosis.  Acrylic balloon kyphoplasty of T12.  Ali Mclaurin J 10/04/2013, 2:20 PM

## 2013-10-04 NOTE — Transfer of Care (Signed)
Immediate Anesthesia Transfer of Care Note  Patient: Rhonda [name]  Procedure(s) Performed: Procedure(s) with comments: T12 KYPHOPLASTY (N/A) - T12 KYPHOPLASTY  Patient Location: PACU  Anesthesia Type:General  Level of Consciousness: awake, alert  and oriented  Airway & Oxygen Therapy: Patient Spontanous Breathing and Patient connected to nasal cannula oxygen  Post-op Assessment: Report given to PACU RN  Post vital signs: Reviewed and stable  Complications: No apparent anesthesia complications

## 2013-10-11 ENCOUNTER — Encounter (HOSPITAL_COMMUNITY): Payer: Self-pay | Admitting: Neurological Surgery

## 2013-12-13 ENCOUNTER — Other Ambulatory Visit: Payer: Self-pay

## 2013-12-22 ENCOUNTER — Encounter: Payer: Self-pay | Admitting: Internal Medicine

## 2013-12-22 ENCOUNTER — Encounter (INDEPENDENT_AMBULATORY_CARE_PROVIDER_SITE_OTHER): Payer: Self-pay

## 2013-12-22 ENCOUNTER — Ambulatory Visit (INDEPENDENT_AMBULATORY_CARE_PROVIDER_SITE_OTHER): Payer: Medicare Other | Admitting: Internal Medicine

## 2013-12-22 VITALS — BP 140/86 | HR 83 | Ht 66.5 in | Wt 107.0 lb

## 2013-12-22 DIAGNOSIS — I480 Paroxysmal atrial fibrillation: Secondary | ICD-10-CM

## 2013-12-22 DIAGNOSIS — I441 Atrioventricular block, second degree: Secondary | ICD-10-CM

## 2013-12-22 DIAGNOSIS — I4891 Unspecified atrial fibrillation: Secondary | ICD-10-CM

## 2013-12-22 NOTE — Patient Instructions (Signed)
Your physician wants you to follow-up in: 6 months with Rudi Coco, NP and 12 months with Dr Jacquiline Doe will receive a reminder letter in the mail two months in advance. If you don't receive a letter, please call our office to schedule the follow-up appointment.  Remote monitoring is used to monitor your Pacemaker or ICD from home. This monitoring reduces the number of office visits required to check your device to one time per year. It allows [place] to keep an eye on the functioning of your device to ensure it is working properly. You are scheduled for a device check from home on 03/23/14. You may send your transmission at any time that day. If you have a wireless device, the transmission will be sent automatically. After your physician reviews your transmission, you will receive a postcard with your next transmission date.

## 2013-12-22 NOTE — Progress Notes (Signed)
PCP: Londell Moh, MD  Rhonda [name] is a 80 y.o. female who presents today for routine electrophysiology followup.  Since last being seen in our clinic, the patient has struggled with multiple fractured vertebrae.  She tried forteo and did not tolerate this.  She did have several brief spells of afib at that time.  She has had no other afib since I saw her last.  She remains very pleased with the results of her ablation.   Today, she denies symptoms of palpitations, chest pain, shortness of breath,  lower extremity edema, dizziness, presyncope, or syncope.  The patient is otherwise without complaint today.   Past Medical History  Diagnosis Date  . PAF (paroxysmal atrial fibrillation)     takes Coumadin daily  . Glaucoma     uses Eye Drops daily  . LBBB (left bundle branch block)   . Anemia   . GI bleed     previously with pradaxa  . Hyperlipidemia     takes Atorvastatin daily  . Coronary disease     s/p PTCA 1990s  . 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 H/O hiatal hernia   . Hypertension     takes Azor and Apresoline daily  . GERD (gastroesophageal reflux disease)     takes Protonix daily  . Sore throat     since anesthesia in Feb 2015 AND ALLEGERIES   Past Surgical History  Procedure Laterality Date  . Coronary angioplasty  1990s    by Dr Aleen Campi  . Colonoscopy    . Esophagogastrectomy    . Tee without cardioversion  11/11/2011    Procedure: TRANSESOPHAGEAL ECHOCARDIOGRAM (TEE);  Surgeon: Chrystie Nose, MD;  Location: [facility] ENDOSCOPY;  Service: Cardiovascular;  Laterality: N/A;  . Atrial fibrillation ablation  11/12/11    PVI by Dr Johney Frame  . Pacemaker insertion  11/13/11    MDT implanted by Dr Royann Shivers for 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 observed on EP study  . Cataract extraction w/ intraocular lens  implant, bilateral    . Eye surgery      GLAUCOMA SURG LT EYE  . Kyphoplasty N/A 08/09/2013    Procedure: LUMBAR  TWO KYPHOPLASTY;  Surgeon: Barnett Abu, MD;  Location: [facility] NEURO ORS;  Service: Neurosurgery;  Laterality: N/A;  L2 Kyphoplasty  . Kyphoplasty N/A 09/07/2013    Procedure: L1 KYPHOPLASTY;  Surgeon: Barnett Abu, MD;  Location: [facility] NEURO ORS;  Service: Neurosurgery;  Laterality: N/A;  . Kyphoplasty N/A 10/04/2013    Procedure: T12 KYPHOPLASTY;  Surgeon: Barnett Abu, MD;  Location: [facility] NEURO ORS;  Service: Neurosurgery;  Laterality: N/A;  T12 KYPHOPLASTY    Current Outpatient Prescriptions  Medication Sig Dispense Refill  . amLODipine-olmesartan (AZOR) 5-40 MG per tablet Take 1 tablet by mouth daily.      . brimonidine (ALPHAGAN P) 0.15 % ophthalmic solution Place 1 drop into the right eye 3 (three) times daily.       . Cholecalciferol (VITAMIN D3) 1000 UNITS CAPS Take 2,000 Units by mouth daily.       . dorzolamide (TRUSOPT) 2 % ophthalmic solution Place 1 drop into the right eye 2 (two) times daily.       . nitroGLYCERIN (NITROSTAT) 0.4 MG SL tablet Place 0.4 mg under the tongue every 5 (five) minutes as needed. For chest pain.      Marland Kitchen Olopatadine HCl (PATADAY) 0.2 % SOLN Place 1 drop into  both eyes daily.       Marland Kitchen. OVER THE COUNTER MEDICATION Take 1 tablet by mouth 3 (three) times daily. Calcium Magnesium Zinc Copper and vitamin d3      . Polyethyl Glycol-Propyl Glycol (SYSTANE OP) Place 1 drop into both eyes daily as needed (for dry eyes). For moisture.      . Travoprost (TRAVATAN Z OP) Place 1 drop into the right eye at bedtime. Right Eye only      . warfarin (COUMADIN) 5 MG tablet Take 2.5-5 mg by mouth See admin instructions.        No current facility-administered medications for this visit.   Facility-Administered Medications Ordered in Other Visits  Medication Dose Route Frequency Provider Last Rate Last Dose  . sodium chloride 0.9 % injection 3 mL  3 mL Intravenous PRN         Physical Exam: Filed Vitals:   12/22/13 1040  BP: 140/86  Pulse: 83  Height: 5' 6.5" (1.689 m)  Weight:  107 lb (48.535 kg)    GEN- The patient is well appearing, alert and oriented x 3 today.   Head- normocephalic, atraumatic Eyes-  Sclera clear, conjunctiva pink Ears- hearing intact Oropharynx- clear Lungs- Clear to ausculation bilaterally, normal work of breathing Heart- Regular rate and rhythm, 2/6 SEM at the apex GI- soft, NT, ND, + BS Extremities- no clubbing, cyanosis, or edema  Pacemaker interrogation today is reviewed and normal (see paceart)  Assessment and Plan:  Paroxysmal atrial fibrillation  Doing well s/p ablation off of AAD No changes today  She will take cardizem as needed for afib with RVR Return in 6 months to see Rudi CocoDonna Carroll in the afib clinic   Second degree Mobitz II AV block  See Pace Art report  No changes today  [facility] Return to AF clinic in 6 months I will see in a year

## 2013-12-24 LAB — MDC_IDC_ENUM_SESS_TYPE_INCLINIC
Battery Remaining Longevity: 139 mo
Battery Voltage: 2.78 V
Brady Statistic AS VP Percent: 0 %
Lead Channel Impedance Value: 424 Ohm
Lead Channel Pacing Threshold Amplitude: 0.5 V
Lead Channel Pacing Threshold Amplitude: 0.5 V
Lead Channel Pacing Threshold Pulse Width: 0.4 ms
Lead Channel Pacing Threshold Pulse Width: 0.4 ms
Lead Channel Sensing Intrinsic Amplitude: 22.4 mV
Lead Channel Setting Pacing Amplitude: 2 V
Lead Channel Setting Pacing Amplitude: 2.5 V
Lead Channel Setting Pacing Pulse Width: 0.4 ms
Lead Channel Setting Sensing Sensitivity: 5.6 mV
MDC IDC MSMT BATTERY IMPEDANCE: 159 Ohm
MDC IDC MSMT LEADCHNL RA SENSING INTR AMPL: 4 mV
MDC IDC MSMT LEADCHNL RV IMPEDANCE VALUE: 391 Ohm
MDC IDC SESS DTM: 20150624135057
MDC IDC STAT BRADY AP VP PERCENT: 0 %
MDC IDC STAT BRADY AP VS PERCENT: 26 %
MDC IDC STAT BRADY AS VS PERCENT: 73 %

## 2014-02-28 ENCOUNTER — Other Ambulatory Visit (HOSPITAL_COMMUNITY): Payer: Self-pay | Admitting: Endocrinology

## 2014-02-28 DIAGNOSIS — E213 Hyperparathyroidism, unspecified: Secondary | ICD-10-CM

## 2014-03-08 ENCOUNTER — Encounter (HOSPITAL_COMMUNITY)
Admission: RE | Admit: 2014-03-08 | Discharge: 2014-03-08 | Disposition: A | Discharge status: Home / Self Care | Payer: Medicare Other | Source: Ambulatory Visit | Attending: Endocrinology | Admitting: Endocrinology

## 2014-03-08 DIAGNOSIS — E210 Primary hyperparathyroidism: Secondary | ICD-10-CM | POA: Insufficient documentation

## 2014-03-08 DIAGNOSIS — E213 Hyperparathyroidism, unspecified: Secondary | ICD-10-CM | POA: Insufficient documentation

## 2014-03-08 MED ORDER — TECHNETIUM TC 99M SESTAMIBI GENERIC - CARDIOLITE
25.0000 | Freq: Once | INTRAVENOUS | Status: AC | PRN
  Administered 2014-03-08: 25 via INTRAVENOUS

## 2014-03-23 ENCOUNTER — Ambulatory Visit (INDEPENDENT_AMBULATORY_CARE_PROVIDER_SITE_OTHER): Payer: Medicare Other | Admitting: *Deleted

## 2014-03-23 ENCOUNTER — Encounter: Payer: Self-pay | Admitting: Cardiovascular Disease

## 2014-03-23 DIAGNOSIS — I441 Atrioventricular block, second degree: Secondary | ICD-10-CM

## 2014-03-23 NOTE — Progress Notes (Signed)
Remote pacemaker transmission.   

## 2014-03-24 LAB — MDC_IDC_ENUM_SESS_TYPE_REMOTE
Battery Remaining Longevity: 130 mo
Battery Voltage: 2.78 V
Brady Statistic AP VP Percent: 0 %
Brady Statistic AP VS Percent: 42 %
Brady Statistic AS VP Percent: 0 %
Date Time Interrogation Session: 20150923121328
Lead Channel Impedance Value: 424 Ohm
Lead Channel Pacing Threshold Amplitude: 0.5 V
Lead Channel Pacing Threshold Pulse Width: 0.4 ms
Lead Channel Pacing Threshold Pulse Width: 0.4 ms
Lead Channel Sensing Intrinsic Amplitude: 16 mV
Lead Channel Sensing Intrinsic Amplitude: 2.8 mV
Lead Channel Setting Pacing Pulse Width: 0.4 ms
MDC IDC MSMT BATTERY IMPEDANCE: 183 Ohm
MDC IDC MSMT LEADCHNL RV IMPEDANCE VALUE: 392 Ohm
MDC IDC MSMT LEADCHNL RV PACING THRESHOLD AMPLITUDE: 0.625 V
MDC IDC SET LEADCHNL RA PACING AMPLITUDE: 2 V
MDC IDC SET LEADCHNL RV PACING AMPLITUDE: 2.5 V
MDC IDC SET LEADCHNL RV SENSING SENSITIVITY: 5.6 mV
MDC IDC STAT BRADY AS VS PERCENT: 57 %

## 2014-03-28 ENCOUNTER — Encounter: Payer: Self-pay | Admitting: Cardiology

## 2014-05-06 ENCOUNTER — Other Ambulatory Visit: Payer: Self-pay

## 2014-05-06 MED ORDER — AMLODIPINE-OLMESARTAN 5-40 MG PO TABS: 1.0000 | ORAL_TABLET | Freq: Every day | ORAL | Status: DC

## 2014-05-09 ENCOUNTER — Other Ambulatory Visit: Payer: Self-pay | Admitting: *Deleted

## 2014-05-09 MED ORDER — AMLODIPINE-OLMESARTAN 5-40 MG PO TABS: 1.0000 | ORAL_TABLET | Freq: Every day | ORAL | Status: DC

## 2014-06-09 ENCOUNTER — Encounter (HOSPITAL_COMMUNITY): Payer: Self-pay | Admitting: Internal Medicine

## 2014-06-22 ENCOUNTER — Encounter: Payer: Self-pay | Admitting: Internal Medicine

## 2014-06-22 ENCOUNTER — Ambulatory Visit (INDEPENDENT_AMBULATORY_CARE_PROVIDER_SITE_OTHER): Payer: Medicare Other | Admitting: Internal Medicine

## 2014-06-22 VITALS — BP 162/70 | HR 69 | Ht 66.5 in | Wt 114.0 lb

## 2014-06-22 DIAGNOSIS — I480 Paroxysmal atrial fibrillation: Secondary | ICD-10-CM

## 2014-06-22 DIAGNOSIS — I4819 Other persistent atrial fibrillation: Secondary | ICD-10-CM

## 2014-06-22 DIAGNOSIS — I441 Atrioventricular block, second degree: Secondary | ICD-10-CM

## 2014-06-22 DIAGNOSIS — I10 Essential (primary) hypertension: Secondary | ICD-10-CM

## 2014-06-22 DIAGNOSIS — I481 Persistent atrial fibrillation: Secondary | ICD-10-CM

## 2014-06-22 LAB — MDC_IDC_ENUM_SESS_TYPE_INCLINIC
Battery Remaining Longevity: 129 mo
Brady Statistic AS VP Percent: 0 %
Brady Statistic AS VS Percent: 55 %
Lead Channel Impedance Value: 397 Ohm
Lead Channel Pacing Threshold Amplitude: 0.5 V
Lead Channel Sensing Intrinsic Amplitude: 15.67 mV
Lead Channel Setting Pacing Amplitude: 2 V
Lead Channel Setting Pacing Amplitude: 2.5 V
Lead Channel Setting Pacing Pulse Width: 0.4 ms
MDC IDC MSMT BATTERY IMPEDANCE: 183 Ohm
MDC IDC MSMT BATTERY VOLTAGE: 2.78 V
MDC IDC MSMT LEADCHNL RA IMPEDANCE VALUE: 401 Ohm
MDC IDC MSMT LEADCHNL RA PACING THRESHOLD AMPLITUDE: 0.5 V
MDC IDC MSMT LEADCHNL RA PACING THRESHOLD PULSEWIDTH: 0.4 ms
MDC IDC MSMT LEADCHNL RA SENSING INTR AMPL: 4 mV
MDC IDC MSMT LEADCHNL RV PACING THRESHOLD PULSEWIDTH: 0.4 ms
MDC IDC SESS DTM: 20151223154051
MDC IDC SET LEADCHNL RV SENSING SENSITIVITY: 5.6 mV
MDC IDC STAT BRADY AP VP PERCENT: 0 %
MDC IDC STAT BRADY AP VS PERCENT: 45 %

## 2014-06-22 MED ORDER — HYDROCHLOROTHIAZIDE 12.5 MG PO TABS: ORAL_TABLET | ORAL | Status: DC

## 2014-06-22 NOTE — Progress Notes (Deleted)
PCP: Londell Moh, MD  Rhonda [name] is a 80 y.o. female who presents today for routine electrophysiology followup.   She remains very pleased with the results of her ablation. She denies any sensation of afib.Device interrogation does not show any evidence of afib as well. Tolerating blood thinner without bleeding issues.     Today, she denies symptoms of palpitations, chest pain, shortness of breath, positive for some mild lower extremity edema.  no, dizziness, presyncope, or syncope.  The patient is otherwise without complaint today.   Past Medical History  Diagnosis Date  . PAF (paroxysmal atrial fibrillation)     takes Coumadin daily  . Glaucoma     uses Eye Drops daily  . LBBB (left bundle branch block)   . Anemia   . GI bleed     previously with pradaxa  . Hyperlipidemia     takes Atorvastatin daily  . Coronary disease     s/p PTCA 1990s  . 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 H/O hiatal hernia   . Hypertension     takes Azor and Apresoline daily  . GERD (gastroesophageal reflux disease)     takes Protonix daily  . Sore throat     since anesthesia in Feb 2015 AND ALLEGERIES   Past Surgical History  Procedure Laterality Date  . Coronary angioplasty  1990s    by Dr Aleen Campi  . Colonoscopy    . Esophagogastrectomy    . Tee without cardioversion  11/11/2011    Procedure: TRANSESOPHAGEAL ECHOCARDIOGRAM (TEE);  Surgeon: Chrystie Nose, MD;  Location[facility] ENDOSCOPY;  Service: Cardiovascular;  Laterality: N/A;  . Atrial fibrillation ablation  11/12/11    PVI by Dr Johney Frame  . Pacemaker insertion  11/13/11    MDT implanted by Dr Royann Shivers for 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 observed on EP study  . Cataract extraction w/ intraocular lens  implant, bilateral    . Eye surgery      GLAUCOMA SURG LT EYE  . Kyphoplasty N/A 08/09/2013    Procedure: LUMBAR TWO KYPHOPLASTY;  Surgeon: Barnett Abu, MD;  Location: [facility] NEURO ORS;   Service: Neurosurgery;  Laterality: N/A;  L2 Kyphoplasty  . Kyphoplasty N/A 09/07/2013    Procedure: L1 KYPHOPLASTY;  Surgeon: Barnett Abu, MD;  Location: [facility] NEURO ORS;  Service: Neurosurgery;  Laterality: N/A;  . Kyphoplasty N/A 10/04/2013    Procedure: T12 KYPHOPLASTY;  Surgeon: Barnett Abu, MD;  Location: [facility] NEURO ORS;  Service: Neurosurgery;  Laterality: N/A;  T12 KYPHOPLASTY  . Atrial fibrillation ablation N/A 11/12/2011    Procedure: ATRIAL FIBRILLATION ABLATION;  Surgeon: [name], MD;  Location: [facility] CATH LAB;  Service: Cardiovascular;  Laterality: N/A;  . Temporary pacemaker insertion  11/12/2011    Procedure: TEMPORARY PACEMAKER INSERTION;  Surgeon: [name], MD;  Location: [facility] CATH LAB;  Service: Cardiovascular;;  . Permanent pacemaker insertion N/A 11/13/2011    Procedure: PERMANENT PACEMAKER INSERTION;  Surgeon: Thurmon Fair, MD;  Location: [facility] CATH LAB;  Service: Cardiovascular;  Laterality: N/A;    Current Outpatient Prescriptions  Medication Sig Dispense Refill  . amLODipine-olmesartan (AZOR) 5-40 MG per tablet Take 1 tablet by mouth daily. 90 tablet 1  . atorvastatin (LIPITOR) 40 MG tablet Take by mouth daily.  2  . brimonidine (ALPHAGAN P) 0.15 % ophthalmic solution Place 1 drop into the right eye 3 (three) times daily.     . Cholecalciferol (VITAMIN  D3) 1000 UNITS CAPS Take 2,000 Units by mouth daily.     . dorzolamide (TRUSOPT) 2 % ophthalmic solution Place 1 drop into the right eye 3 (three) times daily.     . Olopatadine HCl (PATADAY) 0.2 % SOLN Place 1 drop into both eyes daily.     Marland Kitchen OVER THE COUNTER MEDICATION Take 1 tablet by mouth 3 (three) times daily. Calcium Magnesium Zinc Copper and vitamin d3    . Polyethyl Glycol-Propyl Glycol (SYSTANE OP) Place 1 drop into both eyes daily as needed (for dry eyes). For moisture.    . Travoprost (TRAVATAN Z OP) Place 1 drop into the right eye at bedtime. Right Eye only    . warfarin (COUMADIN) 5 MG tablet Take 2.5-5 mg by  mouth See admin instructions.     . hydrochlorothiazide (HYDRODIURIL) 12.5 MG tablet Take 1 tab by mouth as needed for ankle swelling or blood pressure systolic greater than 160 30 tablet 3  . nitroGLYCERIN (NITROSTAT) 0.4 MG SL tablet Place 0.4 mg under the tongue every 5 (five) minutes as needed. For chest pain.     No current facility-administered medications for this visit.   Facility-Administered Medications Ordered in Other Visits  Medication Dose Route Frequency Provider Last Rate Last Dose  . sodium chloride 0.9 % injection 3 mL  3 mL Intravenous PRN         Physical Exam: Filed Vitals:   06/22/14 1514  BP: 162/70  Pulse: 69  Height: 5' 6.5" (1.689 m)  Weight: 114 lb (51.71 kg)    GEN- The patient is well appearing, alert and oriented x 3 today.   Head- normocephalic, atraumatic Eyes-  Sclera clear, conjunctiva pink Ears- hearing intact Oropharynx- clear Lungs- Clear to ausculation bilaterally, normal work of breathing Heart- Regular rate and rhythm, 2/6 SEM at the apex GI- soft, NT, ND, + BS Extremities- no clubbing, cyanosis, or edema  Pacemaker interrogation today is reviewed and normal (see paceart) EKG-NSR,69 bpm, LAD LBBB Assessment and Plan:  Paroxysmal atrial fibrillation  Doing well s/p ablation off of AAD No changes today  She will take cardizem as needed for afib with RVR  Second degree Mobitz II AV block  See Pace Art report  No changes today  HTN/ mild pedal edema HCTZ 12.5 mg as needed for BP over 160 sys or pedal edema. States white coat HTN, usually BP much lower at home.    [facility] Return to Dr. Johney Frame in one year.

## 2014-06-22 NOTE — Progress Notes (Signed)
PCP: Londell Moh, MD  Rhonda [name] is a 80 y.o. female who presents today for routine electrophysiology followup.   She remains very pleased with the results of her ablation. She denies any sensation of afib.Device interrogation does not show any evidence of afib as well. Tolerating blood thinner without bleeding issues.     Today, she denies symptoms of palpitations, chest pain, shortness of breath, positive for some mild lower extremity edema.  no, dizziness, presyncope, or syncope.  The patient is otherwise without complaint today.   Past Medical History  Diagnosis Date  . PAF (paroxysmal atrial fibrillation)     takes Coumadin daily  . Glaucoma     uses Eye Drops daily  . LBBB (left bundle branch block)   . Anemia   . GI bleed     previously with pradaxa  . Hyperlipidemia     takes Atorvastatin daily  . Coronary disease     s/p PTCA 1990s  . 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 H/O hiatal hernia   . Hypertension     takes Azor and Apresoline daily  . GERD (gastroesophageal reflux disease)     takes Protonix daily  . Sore throat     since anesthesia in Feb 2015 AND ALLEGERIES   Past Surgical History  Procedure Laterality Date  . Coronary angioplasty  1990s    by Dr Aleen Campi  . Colonoscopy    . Esophagogastrectomy    . Tee without cardioversion  11/11/2011    Procedure: TRANSESOPHAGEAL ECHOCARDIOGRAM (TEE);  Surgeon: Chrystie Nose, MD;  Location: [facility] ENDOSCOPY;  Service: Cardiovascular;  Laterality: N/A;  . Atrial fibrillation ablation  11/12/11    PVI by Dr Johney Frame  . Pacemaker insertion  11/13/11    MDT implanted by Dr Royann Shivers for 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 observed on EP study  . Cataract extraction w/ intraocular lens  implant, bilateral    . Eye surgery      GLAUCOMA SURG LT EYE  . Kyphoplasty N/A 08/09/2013    Procedure: LUMBAR TWO KYPHOPLASTY;  Surgeon: Barnett Abu, MD;  Location: [facility] NEURO ORS;   Service: Neurosurgery;  Laterality: N/A;  L2 Kyphoplasty  . Kyphoplasty N/A 09/07/2013    Procedure: L1 KYPHOPLASTY;  Surgeon: Barnett Abu, MD;  Location: [facility] NEURO ORS;  Service: Neurosurgery;  Laterality: N/A;  . Kyphoplasty N/A 10/04/2013    Procedure: T12 KYPHOPLASTY;  Surgeon: Barnett Abu, MD;  Location: [facility] NEURO ORS;  Service: Neurosurgery;  Laterality: N/A;  T12 KYPHOPLASTY  . Atrial fibrillation ablation N/A 11/12/2011    Procedure: ATRIAL FIBRILLATION ABLATION;  Surgeon: [name], MD;  Location: [facility] CATH LAB;  Service: Cardiovascular;  Laterality: N/A;  . Temporary pacemaker insertion  11/12/2011    Procedure: TEMPORARY PACEMAKER INSERTION;  Surgeon: [name], MD;  Location: [facility] CATH LAB;  Service: Cardiovascular;;  . Permanent pacemaker insertion N/A 11/13/2011    Procedure: PERMANENT PACEMAKER INSERTION;  Surgeon: Thurmon Fair, MD;  Location: [facility] CATH LAB;  Service: Cardiovascular;  Laterality: N/A;    Current Outpatient Prescriptions  Medication Sig Dispense Refill  . amLODipine-olmesartan (AZOR) 5-40 MG per tablet Take 1 tablet by mouth daily. 90 tablet 1  . atorvastatin (LIPITOR) 40 MG tablet Take by mouth daily.  2  . brimonidine (ALPHAGAN P) 0.15 % ophthalmic solution Place 1 drop into the right eye 3 (three) times daily.     . Cholecalciferol (VITAMIN  D3) 1000 UNITS CAPS Take 2,000 Units by mouth daily.     . dorzolamide (TRUSOPT) 2 % ophthalmic solution Place 1 drop into the right eye 3 (three) times daily.     . Olopatadine HCl (PATADAY) 0.2 % SOLN Place 1 drop into both eyes daily.     Marland Kitchen. OVER THE COUNTER MEDICATION Take 1 tablet by mouth 3 (three) times daily. Calcium Magnesium Zinc Copper and vitamin d3    . Polyethyl Glycol-Propyl Glycol (SYSTANE OP) Place 1 drop into both eyes daily as needed (for dry eyes). For moisture.    . Travoprost (TRAVATAN Z OP) Place 1 drop into the right eye at bedtime. Right Eye only    . warfarin (COUMADIN) 5 MG tablet Take 2.5-5 mg by  mouth See admin instructions.     . hydrochlorothiazide (HYDRODIURIL) 12.5 MG tablet Take 1 tab by mouth as needed for ankle swelling or blood pressure systolic greater than 160 30 tablet 3  . nitroGLYCERIN (NITROSTAT) 0.4 MG SL tablet Place 0.4 mg under the tongue every 5 (five) minutes as needed. For chest pain.     No current facility-administered medications for this visit.   Facility-Administered Medications Ordered in Other Visits  Medication Dose Route Frequency Provider Last Rate Last Dose  . sodium chloride 0.9 % injection 3 mL  3 mL Intravenous PRN         Physical Exam: Filed Vitals:   06/22/14 1514  BP: 162/70  Pulse: 69  Height: 5' 6.5" (1.689 m)  Weight: 114 lb (51.71 kg)    GEN- The patient is well appearing, alert and oriented x 3 today.   Head- normocephalic, atraumatic Eyes-  Sclera clear, conjunctiva pink Ears- hearing intact Oropharynx- clear Lungs- Clear to ausculation bilaterally, normal work of breathing Heart- Regular rate and rhythm, 2/6 SEM at the apex GI- soft, NT, ND, + BS Extremities- no clubbing, cyanosis, or edema  Pacemaker interrogation today is reviewed and normal (see paceart) EKG-NSR,69 bpm, LAD LBBB Assessment and Plan:  Paroxysmal atrial fibrillation  Doing well s/p ablation off of AAD No changes today  She will take cardizem as needed for afib with RVR  Second degree Mobitz II AV block  See Pace Art report  No changes today  HTN/ mild pedal edema HCTZ 12.5 mg as needed for BP over 160 sys or pedal edema.  Carelink Return to see me in one year.

## 2014-06-22 NOTE — Patient Instructions (Signed)
Your physician has recommended you make the following change in your medication:  HCTZ 12.5 mg by mouth as needed for ankle swelling or systolic blood pressure of greater than 160.  Your physician wants you to follow-up in: 12 months with Dr. Jacquiline Doe will receive a reminder letter in the mail two months in advance. If you don't receive a letter, please call our office to schedule the follow-up appointment.  Remote monitoring is used to monitor your Pacemaker or ICD from home. This monitoring reduces the number of office visits required to check your device to one time per year. It allows [place] to keep an eye on the functioning of your device to ensure it is working properly. You are scheduled for a device check from home on 09/21/2014. You may send your transmission at any time that day. If you have a wireless device, the transmission will be sent automatically. After your physician reviews your transmission, you will receive a postcard with your next transmission date.

## 2014-06-30 ENCOUNTER — Encounter: Payer: Self-pay | Admitting: Internal Medicine

## 2014-07-05 IMAGING — RF DG LUMBAR SPINE 2-3V
1 series · 1 of 1 positions shown · non-contrast
Comparison: none

[Series 1: run · 1 of 1 slices shown]
[im 1/1]
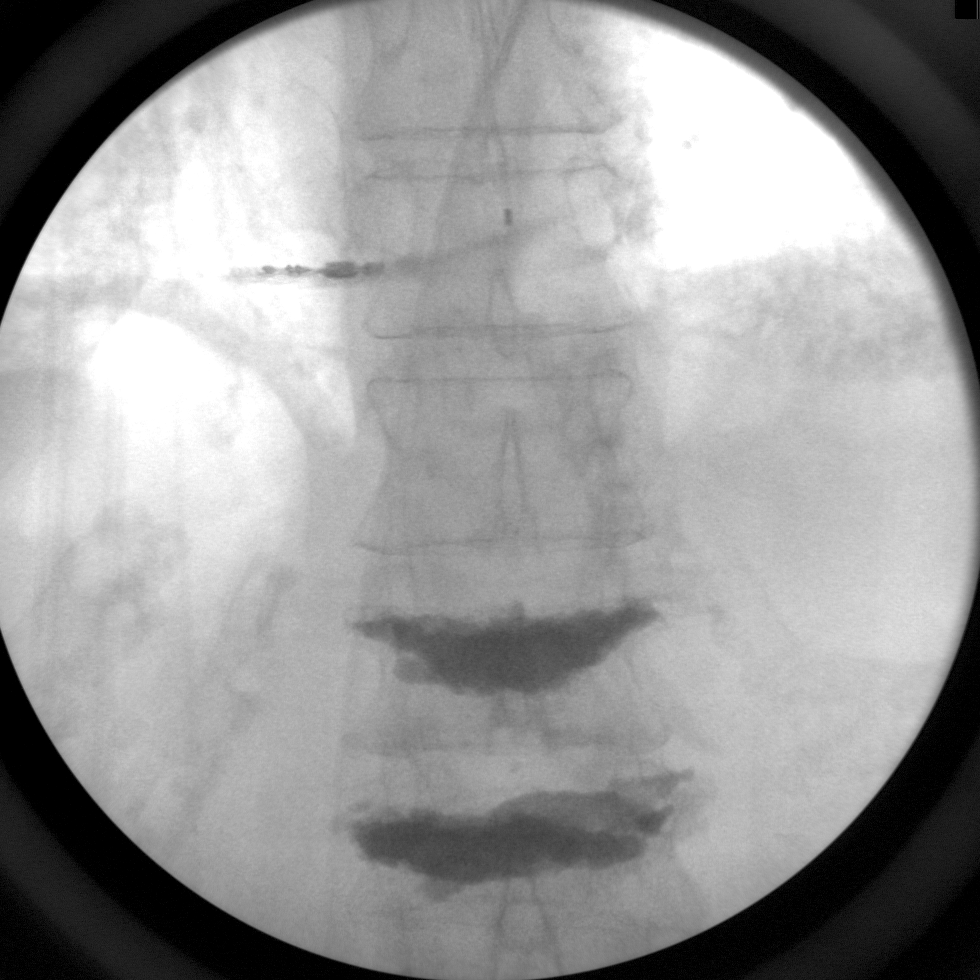

[1 of 1 positions shown; findings below may reference images not displayed]

CLINICAL DATA
L1 kyphoplasty.

EXAM
DG FLUORO GUIDE KYPHOPLASTY

TECHNIQUE
C-arm fluoroscopic images were obtained intraoperatively and
submitted for post operative interpretation. Please see the
performing provider's procedural report for the fluoroscopy time
utilized.

FINDINGS
Compressed L1 body noted on 08/28/2013 radiography has undergone
interval bone augmentation. There is no cement within the spinal
canal. No extensive venous penetration. Ill-defined density at the
medial left base is not as dense as expected for mobilized cement.
There is a remote L2 compression fracture with bone augmentation,
appearance unchanged.

IMPRESSION
L1 compression fracture status post bone augmentation.

SIGNATURE

## 2014-09-21 ENCOUNTER — Telehealth: Payer: Self-pay | Admitting: Cardiology

## 2014-09-21 ENCOUNTER — Ambulatory Visit (INDEPENDENT_AMBULATORY_CARE_PROVIDER_SITE_OTHER): Payer: Medicare Other | Admitting: *Deleted

## 2014-09-21 ENCOUNTER — Encounter: Payer: Self-pay | Admitting: Cardiovascular Disease

## 2014-09-21 DIAGNOSIS — I441 Atrioventricular block, second degree: Secondary | ICD-10-CM

## 2014-09-21 LAB — MDC_IDC_ENUM_SESS_TYPE_REMOTE
Battery Remaining Longevity: 129 mo
Brady Statistic AP VP Percent: 0 %
Brady Statistic AP VS Percent: 53 %
Brady Statistic AS VS Percent: 47 %
Lead Channel Pacing Threshold Amplitude: 0.5 V
Lead Channel Pacing Threshold Pulse Width: 0.4 ms
Lead Channel Sensing Intrinsic Amplitude: 16 mV
Lead Channel Setting Sensing Sensitivity: 5.6 mV
MDC IDC MSMT BATTERY IMPEDANCE: 183 Ohm
MDC IDC MSMT BATTERY VOLTAGE: 2.78 V
MDC IDC MSMT LEADCHNL RA IMPEDANCE VALUE: 424 Ohm
MDC IDC MSMT LEADCHNL RA PACING THRESHOLD PULSEWIDTH: 0.4 ms
MDC IDC MSMT LEADCHNL RA SENSING INTR AMPL: 2.8 mV
MDC IDC MSMT LEADCHNL RV IMPEDANCE VALUE: 473 Ohm
MDC IDC MSMT LEADCHNL RV PACING THRESHOLD AMPLITUDE: 0.75 V
MDC IDC SESS DTM: 20160323152921
MDC IDC SET LEADCHNL RA PACING AMPLITUDE: 2 V
MDC IDC SET LEADCHNL RV PACING AMPLITUDE: 2.5 V
MDC IDC SET LEADCHNL RV PACING PULSEWIDTH: 0.4 ms
MDC IDC STAT BRADY AS VP PERCENT: 0 %

## 2014-09-21 NOTE — Telephone Encounter (Signed)
LMOVM reminding pt to send remote transmission.   

## 2014-09-22 NOTE — Progress Notes (Signed)
Remote pacemaker transmission.   

## 2014-09-29 ENCOUNTER — Encounter: Payer: Self-pay | Admitting: Cardiology

## 2014-10-21 ENCOUNTER — Encounter: Payer: Self-pay | Admitting: Cardiology

## 2014-12-20 ENCOUNTER — Telehealth: Payer: Self-pay | Admitting: Internal Medicine

## 2014-12-20 NOTE — Telephone Encounter (Signed)
Pt's husband Greggory Stallion called stating that the medication Amlodipine-olmesartan 5-40 mg is no longer covered by their insurance and would like for Dr. Johney Frame to prescribe something else, so pt does not have to take two pills. Please advise

## 2014-12-21 MED ORDER — FUROSEMIDE 40 MG PO TABS: 60.0000 mg | ORAL_TABLET | Freq: Every day | ORAL | Status: DC

## 2014-12-21 MED ORDER — AMLODIPINE BESYLATE 5 MG PO TABS: 5.0000 mg | ORAL_TABLET | Freq: Every day | ORAL | Status: DC

## 2014-12-21 NOTE — Telephone Encounter (Signed)
Patients husband following up on call from yesterday as patient will soon be out of medication.

## 2014-12-21 NOTE — Telephone Encounter (Signed)
Discussed with Dr Johney Frame and with her EF being normal will only need the Amlodipine 5 mg daily.  She will need to follow up with Dr Renne Crigler with problems with BP.  Dr Renne Crigler has put her on Furosemide 60mg  daily and this has helped with her edema.  I spoke with her husband and he is aware to follow up with Dr Renne Crigler for BP

## 2014-12-26 ENCOUNTER — Encounter: Payer: Self-pay | Admitting: Internal Medicine

## 2014-12-26 ENCOUNTER — Ambulatory Visit (INDEPENDENT_AMBULATORY_CARE_PROVIDER_SITE_OTHER): Payer: Medicare Other | Admitting: *Deleted

## 2014-12-26 DIAGNOSIS — I441 Atrioventricular block, second degree: Secondary | ICD-10-CM

## 2014-12-26 NOTE — Progress Notes (Signed)
Remote pacemaker transmission.   

## 2014-12-29 LAB — CUP PACEART REMOTE DEVICE CHECK
Battery Impedance: 207 Ohm
Battery Remaining Longevity: 124 mo
Battery Voltage: 2.78 V
Brady Statistic AP VP Percent: 0 %
Brady Statistic AP VS Percent: 53 %
Brady Statistic AS VP Percent: 0 %
Brady Statistic AS VS Percent: 47 %
Date Time Interrogation Session: 20160627124431
Lead Channel Impedance Value: 424 Ohm
Lead Channel Impedance Value: 481 Ohm
Lead Channel Pacing Threshold Amplitude: 0.5 V
Lead Channel Pacing Threshold Amplitude: 0.625 V
Lead Channel Pacing Threshold Pulse Width: 0.4 ms
Lead Channel Pacing Threshold Pulse Width: 0.4 ms
Lead Channel Sensing Intrinsic Amplitude: 16 mV
Lead Channel Sensing Intrinsic Amplitude: 2.8 mV
Lead Channel Setting Pacing Amplitude: 2 V
Lead Channel Setting Pacing Amplitude: 2.5 V
Lead Channel Setting Pacing Pulse Width: 0.4 ms
Lead Channel Setting Sensing Sensitivity: 5.6 mV

## 2015-01-11 ENCOUNTER — Encounter: Payer: Self-pay | Admitting: Cardiology

## 2015-02-02 ENCOUNTER — Other Ambulatory Visit: Payer: Self-pay | Admitting: *Deleted

## 2015-02-02 ENCOUNTER — Encounter: Payer: Self-pay | Admitting: *Deleted

## 2015-03-22 ENCOUNTER — Encounter: Payer: Self-pay | Admitting: Cardiology

## 2015-03-27 ENCOUNTER — Ambulatory Visit (INDEPENDENT_AMBULATORY_CARE_PROVIDER_SITE_OTHER): Payer: Medicare Other | Admitting: *Deleted

## 2015-03-27 DIAGNOSIS — I441 Atrioventricular block, second degree: Secondary | ICD-10-CM | POA: Diagnosis not present

## 2015-03-28 NOTE — Progress Notes (Signed)
Remote pacemaker transmission.   

## 2015-04-03 LAB — CUP PACEART REMOTE DEVICE CHECK
Battery Remaining Longevity: 121 mo
Battery Voltage: 2.78 V
Brady Statistic AS VP Percent: 0 %
Date Time Interrogation Session: 20160926121426
Lead Channel Impedance Value: 430 Ohm
Lead Channel Impedance Value: 472 Ohm
Lead Channel Pacing Threshold Amplitude: 0.625 V
Lead Channel Pacing Threshold Pulse Width: 0.4 ms
Lead Channel Pacing Threshold Pulse Width: 0.4 ms
Lead Channel Setting Pacing Amplitude: 2.5 V
Lead Channel Setting Sensing Sensitivity: 5.6 mV
MDC IDC MSMT BATTERY IMPEDANCE: 231 Ohm
MDC IDC MSMT LEADCHNL RA PACING THRESHOLD AMPLITUDE: 0.5 V
MDC IDC MSMT LEADCHNL RA SENSING INTR AMPL: 2.8 mV — AB
MDC IDC MSMT LEADCHNL RV SENSING INTR AMPL: 11.2 mV
MDC IDC SET LEADCHNL RA PACING AMPLITUDE: 2 V
MDC IDC SET LEADCHNL RV PACING PULSEWIDTH: 0.4 ms
MDC IDC STAT BRADY AP VP PERCENT: 0 %
MDC IDC STAT BRADY AP VS PERCENT: 51 %
MDC IDC STAT BRADY AS VS PERCENT: 49 %

## 2015-04-18 ENCOUNTER — Encounter: Payer: Self-pay | Admitting: *Deleted

## 2015-06-27 ENCOUNTER — Encounter: Payer: Self-pay | Admitting: Physician Assistant

## 2015-06-27 NOTE — Progress Notes (Signed)
Cardiology Office Note Date:  06/28/2015  Patient ID:  Rhonda, [name] March 06, 1930, MRN [id number] PCP:  Londell Moh, MD  Electrophysiologist: Dr. Johney Frame   Chief Complaint: returns for an annual office visit   History of Present Illness: Rhonda [name] is a 81 y.o. female with history of PAFib s/p ablation 11/12/11 with CHADS2Vasc of at least 5 on coumadin being monitored and managed with her PMD and reports good control without need for much change in her dosing, hx of GIB while on Pradaxa, LBBB, Glaucoma, CAD, and PPM, comes to the office for a routine annual visit.   She is accompanied by her husband, feeling well, denies any kind of CP or SOB, she has infrequent skipped or extra beat only, no significant palpitations, no dizziness, near syncope or syncope.  She walks to stay active though her chronic back pain is limiting.   Past Medical History  Diagnosis Date  . PAF (paroxysmal atrial fibrillation) (HCC)     PVI ablation May 2013, takes Coumadin daily  . Glaucoma     uses Eye Drops daily  . LBBB (left bundle branch block)   . Anemia   . GI bleed     previously with pradaxa  . Hyperlipidemia     takes Atorvastatin daily  . Coronary disease     s/p PTCA 1990s  . Pacemaker     MDT Advisa May 2013, Dr. C  . H/O hiatal hernia   . Hypertension     takes Azor and Apresoline daily  . GERD (gastroesophageal reflux disease)     takes Protonix daily  . Sore throat     since anesthesia in Feb 2015 AND ALLEGERIES  . Bilateral carotid bruits     Past Surgical History  Procedure Laterality Date  . Coronary angioplasty  1990s    by Dr Aleen Campi  . Colonoscopy    . Esophagogastrectomy    . Tee without cardioversion  11/11/2011    Procedure: TRANSESOPHAGEAL ECHOCARDIOGRAM (TEE);  Surgeon: Chrystie Nose, MD;  Location: [facility] ENDOSCOPY;  Service: Cardiovascular;  Laterality: N/A;  . Atrial fibrillation ablation  11/12/11    PVI by Dr Johney Frame  . Pacemaker insertion   11/13/11    MDT implanted by Dr Royann Shivers for 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 observed on EP study  . Cataract extraction w/ intraocular lens  implant, bilateral    . Eye surgery      GLAUCOMA SURG LT EYE  . Kyphoplasty N/A 08/09/2013    Procedure: LUMBAR TWO KYPHOPLASTY;  Surgeon: Barnett Abu, MD;  Location: [facility] NEURO ORS;  Service: Neurosurgery;  Laterality: N/A;  L2 Kyphoplasty  . Kyphoplasty N/A 09/07/2013    Procedure: L1 KYPHOPLASTY;  Surgeon: Barnett Abu, MD;  Location: [facility] NEURO ORS;  Service: Neurosurgery;  Laterality: N/A;  . Kyphoplasty N/A 10/04/2013    Procedure: T12 KYPHOPLASTY;  Surgeon: Barnett Abu, MD;  Location: [facility] NEURO ORS;  Service: Neurosurgery;  Laterality: N/A;  T12 KYPHOPLASTY  . Atrial fibrillation ablation N/A 11/12/2011    Procedure: ATRIAL FIBRILLATION ABLATION;  Surgeon: [name], MD;  Location: [facility] CATH LAB;  Service: Cardiovascular;  Laterality: N/A;  . Temporary pacemaker insertion  11/12/2011    Procedure: TEMPORARY PACEMAKER INSERTION;  Surgeon: [name], MD;  Location: [facility] CATH LAB;  Service: Cardiovascular;;  . Permanent pacemaker insertion N/A 11/13/2011    Procedure: PERMANENT PACEMAKER INSERTION;  Surgeon: Thurmon Fair, MD;  Location: [facility] CATH LAB;  Service: Cardiovascular;  Laterality:  N/A;    Current Outpatient Prescriptions  Medication Sig Dispense Refill  . amLODipine (NORVASC) 5 MG tablet Take 1 tablet (5 mg total) by mouth daily. 90 tablet 3  . atorvastatin (LIPITOR) 40 MG tablet Take by mouth daily.  2  . brimonidine (ALPHAGAN P) 0.15 % ophthalmic solution Place 1 drop into the right eye 3 (three) times daily.     . Cholecalciferol (VITAMIN D3) 1000 UNITS CAPS Take 2,000 Units by mouth daily.     . dorzolamide (TRUSOPT) 2 % ophthalmic solution Place 1 drop into the right eye 3 (three) times daily.     . furosemide (LASIX) 40 MG tablet Take 1.5 tablets (60 mg total) by mouth daily. 135 tablet 3  . nitroGLYCERIN (NITROSTAT) 0.4 MG SL  tablet Place 0.4 mg under the tongue every 5 (five) minutes as needed. For chest pain.    Bertram Gala Glycol-Propyl Glycol (SYSTANE OP) Place 1 drop into both eyes daily as needed (for dry eyes). For moisture.    . Travoprost (TRAVATAN Z OP) Place 1 drop into the right eye at bedtime. Right Eye only    . warfarin (COUMADIN) 5 MG tablet Take 2.5-5 mg by mouth See admin instructions.      No current facility-administered medications for this visit.   Facility-Administered Medications Ordered in Other Visits  Medication Dose Route Frequency Provider Last Rate Last Dose  . sodium chloride 0.9 % injection 3 mL  3 mL Intravenous PRN         Allergies:   Azor; Beta adrenergic blockers; Norvasc; Pradaxa; Questran; and Sulfa drugs cross reactors   Social History:  The patient  reports that she has never smoked. She does not have any smokeless tobacco history on file. She reports that she does not drink alcohol or use illicit drugs.   Family History:  The patient's family history includes Cancer in her maternal grandmother; Heart disease in her brother, brother, brother, father, maternal grandfather, and mother; Pneumonia in her paternal grandfather, paternal grandmother, and sister.  ROS:  Please see the history of present illness.   All other systems are reviewed and otherwise negative.   PHYSICAL EXAM:  VS:  BP 165/80 mmHg  Pulse 76  Ht 5' 6.5" (1.689 m)  Wt 113 lb 9.6 oz (51.529 kg)  BMI 18.06 kg/m2  SpO2 98% BMI: Body mass index is 18.06 kg/(m^2). Thin, well developed WF, in no acute distress HEENT: normocephalic, atraumatic Neck: no JVD, carotid bruits or masses Cardiac:  normal S1, S2; RRR; no significant murmurs, no rubs, or gallops Lungs:  clear to auscultation bilaterally, no wheezing, rhonchi or rales Abd: soft, nontender MS: no deformity or atrophy Ext: no edema Skin: warm and dry, no rash Neuro:  No gross deficits appreciated Psych: euthymic mood, full affect  PPM site is  stable, no tethering or discomfort   EKG:  Done today shows SR, LBBB Pacer interrogation today normal function, one high rate, 51 seconds likely SVT/AT, no V pacing  06/16/13 Echocardiogram: Study Conclusions - Left ventricle: The cavity size was normal. There was mild concentric hypertrophy. Systolic function was normal. The estimated ejection fraction was in the range of 55% to 60%. Wall motion was normal; there were no regional wall motion abnormalities. Doppler parameters are consistent with abnormal left ventricular relaxation (grade 1 diastolic dysfunction). - Tricuspid valve: Mild regurgitation. - Pulmonary arteries: Systolic pressure was within the normal range.  Recent Labs:  Wt Readings from Last 3 Encounters:  06/28/15 113 lb  9.6 oz (51.529 kg)  06/22/14 114 lb (51.71 kg)  12/22/13 107 lb (48.535 kg)     Other studies reviewed: Additional studies/records reviewed today include: summarized above  DEVICE information: Medtronic Adapta model DDDR L1 serial number [id number], implanted May 2013 by Dr. Royann Shivers secondary to Mobitz II  ASSESSMENT AND PLAN:  1. PAF S/p ablation May 2013 CHADS2Vasc is at least 5 On coumadin, no bleeding/signs of bleeding reported by the patient Continue with her PMD INR monitoring  2. HTN Is being managed with her PMD She reports home BP generally 130's/70's  3. 2nd degree Mobitz II PPM Carelink Q 3 mo  4. CAD Last/only intervention 1990's no CP On statin tx, she reports her lipids/labs are done routinely with her PMD  Disposition: F/u with EP, Dr Allredin 1 year, continue q 3 month carelink remote checks  Current medicines are reviewed at length with the patient today.  The patient did not have any concerns regarding medicines.  Judith Blonder, PA-C 06/28/2015 2:34 PM     [facility] [address] [place] [address] [phone] (office)  [phone] (fax)

## 2015-06-28 ENCOUNTER — Ambulatory Visit (INDEPENDENT_AMBULATORY_CARE_PROVIDER_SITE_OTHER): Payer: Medicare Other | Admitting: Physician Assistant

## 2015-06-28 ENCOUNTER — Encounter: Payer: Self-pay | Admitting: Physician Assistant

## 2015-06-28 ENCOUNTER — Encounter: Payer: Self-pay | Admitting: Internal Medicine

## 2015-06-28 ENCOUNTER — Encounter: Payer: Medicare Other | Admitting: Nurse Practitioner

## 2015-06-28 VITALS — BP 165/80 | HR 76 | Ht 66.5 in | Wt 113.6 lb

## 2015-06-28 DIAGNOSIS — I441 Atrioventricular block, second degree: Secondary | ICD-10-CM

## 2015-06-28 DIAGNOSIS — I10 Essential (primary) hypertension: Secondary | ICD-10-CM

## 2015-06-28 DIAGNOSIS — I480 Paroxysmal atrial fibrillation: Secondary | ICD-10-CM

## 2015-06-28 LAB — CUP PACEART INCLINIC DEVICE CHECK
Battery Remaining Longevity: 121 mo
Battery Voltage: 2.78 V
Brady Statistic AP VP Percent: 0 %
Brady Statistic AP VS Percent: 48 %
Date Time Interrogation Session: 20161228145008
Implantable Lead Implant Date: 20130515
Implantable Lead Model: 5076
Lead Channel Impedance Value: 419 Ohm
Lead Channel Pacing Threshold Amplitude: 0.5 V
Lead Channel Pacing Threshold Amplitude: 0.5 V
Lead Channel Pacing Threshold Amplitude: 0.625 V
Lead Channel Pacing Threshold Amplitude: 0.75 V
Lead Channel Pacing Threshold Pulse Width: 0.4 ms
Lead Channel Pacing Threshold Pulse Width: 0.4 ms
Lead Channel Sensing Intrinsic Amplitude: 4 mV
Lead Channel Setting Pacing Amplitude: 2 V
Lead Channel Setting Pacing Pulse Width: 0.4 ms
Lead Channel Setting Sensing Sensitivity: 5.6 mV
MDC IDC LEAD IMPLANT DT: 20130515
MDC IDC LEAD LOCATION: 753859
MDC IDC LEAD LOCATION: 753860
MDC IDC MSMT BATTERY IMPEDANCE: 231 Ohm
MDC IDC MSMT LEADCHNL RA PACING THRESHOLD PULSEWIDTH: 0.4 ms
MDC IDC MSMT LEADCHNL RV IMPEDANCE VALUE: 466 Ohm
MDC IDC MSMT LEADCHNL RV PACING THRESHOLD PULSEWIDTH: 0.4 ms
MDC IDC MSMT LEADCHNL RV SENSING INTR AMPL: 15.67 mV
MDC IDC SET LEADCHNL RV PACING AMPLITUDE: 2.5 V
MDC IDC STAT BRADY AS VP PERCENT: 0 %
MDC IDC STAT BRADY AS VS PERCENT: 52 %

## 2015-06-28 NOTE — Patient Instructions (Signed)
Medication Instructions:   CONTINUE SAME MEDICATIONS   If you need a refill on your cardiac medications before your next appointment, please call your pharmacy.  Labwork: NONE ORDER TODAY    Testing/Procedures: NONE ORDER TODAY    Follow-Up:   Your physician wants you to follow-up in: ONE YEAR WITH  ALLRED  You will receive a reminder letter in the mail two months in advance. If you don't receive a letter, please call our office to schedule the follow-up appointment.    Remote monitoring is used to monitor your Pacemaker of ICD from home. This monitoring reduces the number of office visits required to check your device to one time per year. It allows [place] to keep an eye on the functioning of your device to ensure it is working properly. You are scheduled for a device check from home on.09/26/15..You may send your transmission at any time that day. If you have a wireless device, the transmission will be sent automatically. After your physician reviews your transmission, you will receive a postcard with your next transmission date.     Any Other Special Instructions Will Be Listed Below (If Applicable).

## 2015-08-10 DIAGNOSIS — Z7901 Long term (current) use of anticoagulants: Secondary | ICD-10-CM | POA: Diagnosis not present

## 2015-08-10 DIAGNOSIS — I4891 Unspecified atrial fibrillation: Secondary | ICD-10-CM | POA: Diagnosis not present

## 2015-08-16 ENCOUNTER — Telehealth: Payer: Self-pay | Admitting: Internal Medicine
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
## 2015-08-16 NOTE — Telephone Encounter (Signed)
Transmission received and reviewed. Normal device function. AF noted 07/27/15. Mr. neelie men understanding and is appreciative.
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
## 2015-08-22 DIAGNOSIS — S32040D Wedge compression fracture of fourth lumbar vertebra, subsequent encounter for fracture with routine healing: Secondary | ICD-10-CM | POA: Diagnosis not present

## 2015-08-22 DIAGNOSIS — S22080D Wedge compression fracture of T11-T12 vertebra, subsequent encounter for fracture with routine healing: Secondary | ICD-10-CM | POA: Diagnosis not present

## 2015-08-30 DIAGNOSIS — I10 Essential (primary) hypertension: Secondary | ICD-10-CM | POA: Diagnosis not present

## 2015-08-30 DIAGNOSIS — D509 Iron deficiency anemia, unspecified: Secondary | ICD-10-CM | POA: Diagnosis not present

## 2015-09-06 DIAGNOSIS — I10 Essential (primary) hypertension: Secondary | ICD-10-CM | POA: Diagnosis not present

## 2015-09-06 DIAGNOSIS — I4891 Unspecified atrial fibrillation: Secondary | ICD-10-CM | POA: Diagnosis not present

## 2015-09-06 DIAGNOSIS — I2510 Atherosclerotic heart disease of native coronary artery without angina pectoris: Secondary | ICD-10-CM | POA: Diagnosis not present

## 2015-09-07 DIAGNOSIS — Z7901 Long term (current) use of anticoagulants: Secondary | ICD-10-CM | POA: Diagnosis not present

## 2015-09-07 DIAGNOSIS — I4891 Unspecified atrial fibrillation: Secondary | ICD-10-CM | POA: Diagnosis not present

## 2015-09-19 DIAGNOSIS — S32019D Unspecified fracture of first lumbar vertebra, subsequent encounter for fracture with routine healing: Secondary | ICD-10-CM | POA: Diagnosis not present

## 2015-09-19 DIAGNOSIS — S32029D Unspecified fracture of second lumbar vertebra, subsequent encounter for fracture with routine healing: Secondary | ICD-10-CM | POA: Diagnosis not present

## 2015-09-19 DIAGNOSIS — S32040D Wedge compression fracture of fourth lumbar vertebra, subsequent encounter for fracture with routine healing: Secondary | ICD-10-CM | POA: Diagnosis not present

## 2015-09-19 DIAGNOSIS — S22080D Wedge compression fracture of T11-T12 vertebra, subsequent encounter for fracture with routine healing: Secondary | ICD-10-CM | POA: Diagnosis not present

## 2015-09-27 ENCOUNTER — Telehealth: Payer: Self-pay | Admitting: Cardiology

## 2015-09-27 ENCOUNTER — Ambulatory Visit (INDEPENDENT_AMBULATORY_CARE_PROVIDER_SITE_OTHER): Payer: Medicare Other | Admitting: *Deleted

## 2015-09-27 DIAGNOSIS — I480 Paroxysmal atrial fibrillation: Secondary | ICD-10-CM

## 2015-09-27 DIAGNOSIS — I441 Atrioventricular block, second degree: Secondary | ICD-10-CM | POA: Diagnosis not present

## 2015-09-27 NOTE — Progress Notes (Signed)
Remote pacemaker transmission.   

## 2015-09-27 NOTE — Telephone Encounter (Signed)
Spoke with pt and reminded pt of remote transmission that is due today. Pt verbalized understanding.   

## 2015-10-03 DIAGNOSIS — S22080D Wedge compression fracture of T11-T12 vertebra, subsequent encounter for fracture with routine healing: Secondary | ICD-10-CM | POA: Diagnosis not present

## 2015-10-05 DIAGNOSIS — Z7901 Long term (current) use of anticoagulants: Secondary | ICD-10-CM | POA: Diagnosis not present

## 2015-10-05 DIAGNOSIS — I4891 Unspecified atrial fibrillation: Secondary | ICD-10-CM | POA: Diagnosis not present

## 2015-10-06 ENCOUNTER — Other Ambulatory Visit: Payer: Self-pay | Admitting: *Deleted

## 2015-10-06 LAB — CUP PACEART REMOTE DEVICE CHECK
Battery Impedance: 280 Ohm
Battery Remaining Longevity: 117 mo
Battery Voltage: 2.78 V
Brady Statistic AP VP Percent: 0 %
Brady Statistic AS VP Percent: 0 %
Date Time Interrogation Session: 20170328142312
Implantable Lead Implant Date: 20130515
Implantable Lead Model: 5076
Implantable Lead Model: 5076
Lead Channel Pacing Threshold Amplitude: 0.5 V
Lead Channel Pacing Threshold Amplitude: 0.625 V
Lead Channel Pacing Threshold Pulse Width: 0.4 ms
Lead Channel Sensing Intrinsic Amplitude: 2.8 mV
Lead Channel Setting Pacing Amplitude: 2.5 V
Lead Channel Setting Sensing Sensitivity: 5.6 mV
MDC IDC LEAD IMPLANT DT: 20130515
MDC IDC LEAD LOCATION: 753859
MDC IDC LEAD LOCATION: 753860
MDC IDC MSMT LEADCHNL RA IMPEDANCE VALUE: 423 Ohm
MDC IDC MSMT LEADCHNL RV IMPEDANCE VALUE: 480 Ohm
MDC IDC MSMT LEADCHNL RV PACING THRESHOLD PULSEWIDTH: 0.4 ms
MDC IDC MSMT LEADCHNL RV SENSING INTR AMPL: 11.2 mV
MDC IDC SET LEADCHNL RA PACING AMPLITUDE: 2 V
MDC IDC SET LEADCHNL RV PACING PULSEWIDTH: 0.4 ms
MDC IDC STAT BRADY AP VS PERCENT: 38 %
MDC IDC STAT BRADY AS VS PERCENT: 62 %

## 2015-10-06 NOTE — Telephone Encounter (Signed)
Patients husband called for a refill on amlodipine for the patient. Per 12/21/14 encounter, patient will follow up with Dr Renne Crigler for bp. Ok to refill or should this be deferred to Dr Renne Crigler? Please advise. Thanks, MI

## 2015-10-06 NOTE — Telephone Encounter (Signed)
Dr Renne Crigler to fill

## 2015-10-09 DIAGNOSIS — S22080D Wedge compression fracture of T11-T12 vertebra, subsequent encounter for fracture with routine healing: Secondary | ICD-10-CM | POA: Diagnosis not present

## 2015-10-09 NOTE — Telephone Encounter (Signed)
Patients husband aware and he will contact Dr Renne Crigler for medication refill.

## 2015-10-10 ENCOUNTER — Encounter: Payer: Self-pay | Admitting: Cardiology

## 2015-10-11 DIAGNOSIS — S22080D Wedge compression fracture of T11-T12 vertebra, subsequent encounter for fracture with routine healing: Secondary | ICD-10-CM | POA: Diagnosis not present

## 2015-10-17 DIAGNOSIS — M810 Age-related osteoporosis without current pathological fracture: Secondary | ICD-10-CM | POA: Diagnosis not present

## 2015-10-17 DIAGNOSIS — S22080D Wedge compression fracture of T11-T12 vertebra, subsequent encounter for fracture with routine healing: Secondary | ICD-10-CM | POA: Diagnosis not present

## 2015-10-19 DIAGNOSIS — S22080D Wedge compression fracture of T11-T12 vertebra, subsequent encounter for fracture with routine healing: Secondary | ICD-10-CM | POA: Diagnosis not present

## 2015-10-24 DIAGNOSIS — S22080D Wedge compression fracture of T11-T12 vertebra, subsequent encounter for fracture with routine healing: Secondary | ICD-10-CM | POA: Diagnosis not present

## 2015-10-26 DIAGNOSIS — S22080D Wedge compression fracture of T11-T12 vertebra, subsequent encounter for fracture with routine healing: Secondary | ICD-10-CM | POA: Diagnosis not present

## 2015-10-31 DIAGNOSIS — S22080D Wedge compression fracture of T11-T12 vertebra, subsequent encounter for fracture with routine healing: Secondary | ICD-10-CM | POA: Diagnosis not present

## 2015-11-01 ENCOUNTER — Encounter: Payer: Self-pay | Admitting: Internal Medicine

## 2015-11-01 ENCOUNTER — Telehealth: Payer: Self-pay | Admitting: *Deleted

## 2015-11-01 NOTE — Telephone Encounter (Signed)
Patient's husband called about a letter patient received from our office.  They are concerned that they were not told if her transmission from 09/26/15 was abnormal.  Advised that transmission was reviewed and that it was within normal limits for the patient.    Patient's husband then reported that the patient has been in AF since about midnight.  Spoke with patient--she feels weak and washed out, but denies chest discomfort, SOB, dizziness, or other symptoms.  Patient reports her HR is about 93bpm.  She wants to know if she should go to ED.  Advised patient that if she is not experiencing symptoms at this time that she does not need to go to the ED.  Patient wants to know if she should take her Bystolic since her HR is "so fast."  Advised patient that Bystolic is not on her medication list, she reports that [facility] gave her samples at some point last year and that they are still good until 2018.  Advised patient that she should not take Bystolic without it being prescribed (currently) by an MD and that a HR of 93 is not unexpected if she is in AF.  Patient is agreeable to sending a transmission for review by Dr. Johney Frame.  Will call patient back when transmission received.  Reviewed transmission with Dr. Johney Frame.  Transmission shows that patient has since converted to SR.  Per Dr. Johney Frame, no additional interventions necessary at this time.  Called patient and husband-- they confirmed that patient felt her HR regularize around the time that they sent the transmission.  Patient and husband are appreciative of call and verbalize understanding of instructions/information.  They deny additional questions or concerns at this time and are aware to call with worsening symptoms.

## 2015-11-02 DIAGNOSIS — S32029D Unspecified fracture of second lumbar vertebra, subsequent encounter for fracture with routine healing: Secondary | ICD-10-CM | POA: Diagnosis not present

## 2015-11-02 DIAGNOSIS — S22080D Wedge compression fracture of T11-T12 vertebra, subsequent encounter for fracture with routine healing: Secondary | ICD-10-CM | POA: Diagnosis not present

## 2015-11-02 DIAGNOSIS — S32040D Wedge compression fracture of fourth lumbar vertebra, subsequent encounter for fracture with routine healing: Secondary | ICD-10-CM | POA: Diagnosis not present

## 2015-11-02 DIAGNOSIS — S32019D Unspecified fracture of first lumbar vertebra, subsequent encounter for fracture with routine healing: Secondary | ICD-10-CM | POA: Diagnosis not present

## 2015-11-07 DIAGNOSIS — Z7901 Long term (current) use of anticoagulants: Secondary | ICD-10-CM | POA: Diagnosis not present

## 2015-11-07 DIAGNOSIS — I4891 Unspecified atrial fibrillation: Secondary | ICD-10-CM | POA: Diagnosis not present

## 2015-12-05 DIAGNOSIS — Z7901 Long term (current) use of anticoagulants: Secondary | ICD-10-CM | POA: Diagnosis not present

## 2015-12-05 DIAGNOSIS — I4891 Unspecified atrial fibrillation: Secondary | ICD-10-CM | POA: Diagnosis not present

## 2015-12-19 DIAGNOSIS — S32029D Unspecified fracture of second lumbar vertebra, subsequent encounter for fracture with routine healing: Secondary | ICD-10-CM | POA: Diagnosis not present

## 2015-12-19 DIAGNOSIS — S22080D Wedge compression fracture of T11-T12 vertebra, subsequent encounter for fracture with routine healing: Secondary | ICD-10-CM | POA: Diagnosis not present

## 2015-12-19 DIAGNOSIS — S32040D Wedge compression fracture of fourth lumbar vertebra, subsequent encounter for fracture with routine healing: Secondary | ICD-10-CM | POA: Diagnosis not present

## 2015-12-19 DIAGNOSIS — S32019D Unspecified fracture of first lumbar vertebra, subsequent encounter for fracture with routine healing: Secondary | ICD-10-CM | POA: Diagnosis not present

## 2015-12-27 ENCOUNTER — Ambulatory Visit (INDEPENDENT_AMBULATORY_CARE_PROVIDER_SITE_OTHER): Payer: Medicare Other | Admitting: *Deleted

## 2015-12-27 DIAGNOSIS — I441 Atrioventricular block, second degree: Secondary | ICD-10-CM | POA: Diagnosis not present

## 2015-12-27 NOTE — Progress Notes (Signed)
Remote pacemaker transmission.   

## 2015-12-28 LAB — CUP PACEART REMOTE DEVICE CHECK
Battery Remaining Longevity: 116 mo
Brady Statistic AP VP Percent: 0 %
Brady Statistic AP VS Percent: 40 %
Brady Statistic AS VP Percent: 0 %
Brady Statistic AS VS Percent: 60 %
Implantable Lead Implant Date: 20130515
Implantable Lead Location: 753859
Implantable Lead Model: 5076
Lead Channel Impedance Value: 509 Ohm
Lead Channel Pacing Threshold Amplitude: 0.5 V
Lead Channel Pacing Threshold Amplitude: 0.625 V
Lead Channel Pacing Threshold Pulse Width: 0.4 ms
Lead Channel Pacing Threshold Pulse Width: 0.4 ms
Lead Channel Sensing Intrinsic Amplitude: 11.2 mV
Lead Channel Setting Pacing Amplitude: 2 V
Lead Channel Setting Pacing Amplitude: 2.5 V
Lead Channel Setting Sensing Sensitivity: 5.6 mV
MDC IDC LEAD IMPLANT DT: 20130515
MDC IDC LEAD LOCATION: 753860
MDC IDC MSMT BATTERY IMPEDANCE: 280 Ohm
MDC IDC MSMT BATTERY VOLTAGE: 2.78 V
MDC IDC MSMT LEADCHNL RA IMPEDANCE VALUE: 429 Ohm
MDC IDC MSMT LEADCHNL RA SENSING INTR AMPL: 2.8 mV
MDC IDC SESS DTM: 20170628125603
MDC IDC SET LEADCHNL RV PACING PULSEWIDTH: 0.4 ms

## 2015-12-29 ENCOUNTER — Encounter: Payer: Self-pay | Admitting: Cardiology

## 2016-01-04 DIAGNOSIS — Z7901 Long term (current) use of anticoagulants: Secondary | ICD-10-CM | POA: Diagnosis not present

## 2016-01-04 DIAGNOSIS — I4891 Unspecified atrial fibrillation: Secondary | ICD-10-CM | POA: Diagnosis not present

## 2016-02-06 DIAGNOSIS — I4891 Unspecified atrial fibrillation: Secondary | ICD-10-CM | POA: Diagnosis not present

## 2016-02-06 DIAGNOSIS — Z7901 Long term (current) use of anticoagulants: Secondary | ICD-10-CM | POA: Diagnosis not present

## 2016-02-12 DIAGNOSIS — H04123 Dry eye syndrome of bilateral lacrimal glands: Secondary | ICD-10-CM | POA: Diagnosis not present

## 2016-02-12 DIAGNOSIS — H43813 Vitreous degeneration, bilateral: Secondary | ICD-10-CM | POA: Diagnosis not present

## 2016-02-12 DIAGNOSIS — H401113 Primary open-angle glaucoma, right eye, severe stage: Secondary | ICD-10-CM | POA: Diagnosis not present

## 2016-02-13 DIAGNOSIS — L570 Actinic keratosis: Secondary | ICD-10-CM | POA: Diagnosis not present

## 2016-02-13 DIAGNOSIS — L821 Other seborrheic keratosis: Secondary | ICD-10-CM | POA: Diagnosis not present

## 2016-02-13 DIAGNOSIS — D1801 Hemangioma of skin and subcutaneous tissue: Secondary | ICD-10-CM | POA: Diagnosis not present

## 2016-02-13 DIAGNOSIS — D235 Other benign neoplasm of skin of trunk: Secondary | ICD-10-CM | POA: Diagnosis not present

## 2016-03-05 DIAGNOSIS — Z7901 Long term (current) use of anticoagulants: Secondary | ICD-10-CM | POA: Diagnosis not present

## 2016-03-05 DIAGNOSIS — I4891 Unspecified atrial fibrillation: Secondary | ICD-10-CM | POA: Diagnosis not present

## 2016-03-27 ENCOUNTER — Ambulatory Visit (INDEPENDENT_AMBULATORY_CARE_PROVIDER_SITE_OTHER): Payer: Medicare Other | Admitting: *Deleted

## 2016-03-27 DIAGNOSIS — I441 Atrioventricular block, second degree: Secondary | ICD-10-CM | POA: Diagnosis not present

## 2016-03-27 NOTE — Progress Notes (Signed)
Remote pacemaker transmission.   

## 2016-04-03 ENCOUNTER — Encounter: Payer: Self-pay | Admitting: Cardiology

## 2016-04-04 DIAGNOSIS — Z23 Encounter for immunization: Secondary | ICD-10-CM | POA: Diagnosis not present

## 2016-04-04 DIAGNOSIS — I4891 Unspecified atrial fibrillation: Secondary | ICD-10-CM | POA: Diagnosis not present

## 2016-04-04 DIAGNOSIS — Z7901 Long term (current) use of anticoagulants: Secondary | ICD-10-CM | POA: Diagnosis not present

## 2016-04-24 LAB — CUP PACEART REMOTE DEVICE CHECK
Battery Impedance: 255 Ohm
Battery Remaining Longevity: 120 mo
Brady Statistic AP VP Percent: 0 %
Brady Statistic AS VP Percent: 0 %
Brady Statistic AS VS Percent: 59 %
Date Time Interrogation Session: 20170927125041
Implantable Lead Location: 753859
Implantable Lead Model: 5076
Lead Channel Pacing Threshold Amplitude: 0.5 V
Lead Channel Pacing Threshold Pulse Width: 0.4 ms
Lead Channel Sensing Intrinsic Amplitude: 11.2 mV
Lead Channel Sensing Intrinsic Amplitude: 2.8 mV
Lead Channel Setting Sensing Sensitivity: 5.6 mV
MDC IDC LEAD IMPLANT DT: 20130515
MDC IDC LEAD IMPLANT DT: 20130515
MDC IDC LEAD LOCATION: 753860
MDC IDC MSMT BATTERY VOLTAGE: 2.78 V
MDC IDC MSMT LEADCHNL RA IMPEDANCE VALUE: 424 Ohm
MDC IDC MSMT LEADCHNL RV IMPEDANCE VALUE: 509 Ohm
MDC IDC MSMT LEADCHNL RV PACING THRESHOLD AMPLITUDE: 0.5 V
MDC IDC MSMT LEADCHNL RV PACING THRESHOLD PULSEWIDTH: 0.4 ms
MDC IDC SET LEADCHNL RA PACING AMPLITUDE: 2 V
MDC IDC SET LEADCHNL RV PACING AMPLITUDE: 2.5 V
MDC IDC SET LEADCHNL RV PACING PULSEWIDTH: 0.4 ms
MDC IDC STAT BRADY AP VS PERCENT: 41 %

## 2016-04-25 ENCOUNTER — Telehealth: Payer: Self-pay | Admitting: Cardiology

## 2016-04-25 ENCOUNTER — Encounter: Payer: Self-pay | Admitting: Internal Medicine

## 2016-04-25 ENCOUNTER — Encounter (HOSPITAL_COMMUNITY): Payer: Self-pay | Admitting: Nurse Practitioner

## 2016-04-25 ENCOUNTER — Ambulatory Visit (HOSPITAL_COMMUNITY)
Admission: RE | Admit: 2016-04-25 | Discharge: 2016-04-25 | Disposition: A | Discharge status: Home / Self Care | Payer: Medicare Other | Source: Ambulatory Visit | Attending: Nurse Practitioner | Admitting: Nurse Practitioner

## 2016-04-25 VITALS — BP 146/80 | HR 75 | Ht 66.5 in | Wt 112.4 lb

## 2016-04-25 DIAGNOSIS — Z7901 Long term (current) use of anticoagulants: Secondary | ICD-10-CM | POA: Diagnosis not present

## 2016-04-25 DIAGNOSIS — I10 Essential (primary) hypertension: Secondary | ICD-10-CM | POA: Insufficient documentation

## 2016-04-25 DIAGNOSIS — I2510 Atherosclerotic heart disease of native coronary artery without angina pectoris: Secondary | ICD-10-CM | POA: Diagnosis not present

## 2016-04-25 DIAGNOSIS — E785 Hyperlipidemia, unspecified: Secondary | ICD-10-CM | POA: Insufficient documentation

## 2016-04-25 DIAGNOSIS — Z888 Allergy status to other drugs, medicaments and biological substances status: Secondary | ICD-10-CM | POA: Insufficient documentation

## 2016-04-25 DIAGNOSIS — Z9889 Other specified postprocedural states: Secondary | ICD-10-CM | POA: Diagnosis not present

## 2016-04-25 DIAGNOSIS — H409 Unspecified glaucoma: Secondary | ICD-10-CM | POA: Diagnosis not present

## 2016-04-25 DIAGNOSIS — I480 Paroxysmal atrial fibrillation: Secondary | ICD-10-CM | POA: Insufficient documentation

## 2016-04-25 DIAGNOSIS — Z79899 Other long term (current) drug therapy: Secondary | ICD-10-CM | POA: Diagnosis not present

## 2016-04-25 DIAGNOSIS — K219 Gastro-esophageal reflux disease without esophagitis: Secondary | ICD-10-CM | POA: Diagnosis not present

## 2016-04-25 MED ORDER — DILTIAZEM HCL 30 MG PO TABS: ORAL_TABLET | ORAL | 2 refills | Status: DC

## 2016-04-25 NOTE — Patient Instructions (Signed)
Your physician has recommended you make the following change in your medication:   1)Cardizem 30mg -- take 1 tablet every 4 hours AS NEEDED for heart rate >100 as long as blood pressure >100.   

## 2016-04-25 NOTE — Telephone Encounter (Signed)
Remote transmission received. Presenting rhythm: AF/AFL/Vs 130s. AF since 0416.  Called and spoke to patient and spouse about patient sx's. Patient currently denies any CP or palpitations. She states that she is just "washed out" today. Bp: 90s/60s per husband.  She says that she is still taking her warfarin as Rx'd.   Will inform Dr.Allred about patient episode and sx's and call patient with any further recommendations.

## 2016-04-25 NOTE — Telephone Encounter (Signed)
Spoke to Dr.Allred regarding patient's c/o. Dr.Allred recommended that patient f/u in AF clinic this pm with Rudi Coco, NP to discuss sx's. He stated that if patient wasn't able to make appt then she could take Cardizem 30mg  Q6hours PRN to control sx's.  Spoke to patient and spouse about appt.   Patient to f/u in AF clinic @ 1545 today. Directions, clinic phone number, and parking code given to patient and spouse. Patient/spouse voiced understanding.

## 2016-04-25 NOTE — Telephone Encounter (Signed)
Pt husband called and stated that pt woke him up around 3-4 AM complaining of being in Atrial Fib. Pulse 112 on electronic cuff and blood pressure is in the 90's/60's. Pt is going to send remote transmission. Please call back to discuss transmission.

## 2016-04-26 NOTE — Progress Notes (Signed)
Primary Care Physician: Londell Moh, MD Referring Physician:Dr. Johney Frame   Rhonda [name] is a 82 y.o. female in the afib clinic for evaluation. She has h/o afib with s/p ablation in 2013,PPM, as well as CAD,PVD. She awoke at 4 am with afib and called the [facility] office and it was confirmed with a remote report sent from her PPM. She was given an appointment in the afib clinic at 4pm. She reports arpind 2p she returned to SR. No triggers for the afib. Her burden is usually low, 0.2% by last interrogation in 9/17. She is on warfarin followed by PCP.  Today, she denies symptoms of palpitations, chest pain, shortness of breath, orthopnea, PND, lower extremity edema, dizziness, presyncope, syncope, or neurologic sequela. The patient is tolerating medications without difficulties and is otherwise without complaint today.   Past Medical History:  Diagnosis Date  . Anemia   . Bilateral carotid bruits   . Coronary disease    s/p PTCA 1990s  . GERD (gastroesophageal reflux disease)    takes Protonix daily  . GI bleed    previously with pradaxa  . Glaucoma    uses Eye Drops daily  . H/O hiatal hernia   . Hyperlipidemia    takes Atorvastatin daily  . Hypertension    takes Azor and Apresoline daily  . LBBB (left bundle branch block)   . Pacemaker    MDT Advisa May 2013, Dr. Salena Saner  . PAF (paroxysmal atrial fibrillation) [facility])    PVI ablation May 2013, takes Coumadin daily  . Sore throat    since anesthesia in Feb 2015 AND ALLEGERIES   Past Surgical History:  Procedure Laterality Date  . atrial fibrillation ablation  11/12/11   PVI by Dr Johney Frame  . ATRIAL FIBRILLATION ABLATION N/A 11/12/2011   Procedure: ATRIAL FIBRILLATION ABLATION;  Surgeon: [name], MD;  Location: [facility] CATH LAB;  Service: Cardiovascular;  Laterality: N/A;  . CATARACT EXTRACTION W/ INTRAOCULAR LENS  IMPLANT, BILATERAL    . COLONOSCOPY    . CORONARY ANGIOPLASTY  1990s   by Dr Aleen Campi  .  ESOPHAGOGASTRECTOMY    . EYE SURGERY     GLAUCOMA SURG LT EYE  . KYPHOPLASTY N/A 08/09/2013   Procedure: LUMBAR TWO KYPHOPLASTY;  Surgeon: Barnett Abu, MD;  [facility]: [facility] NEURO ORS;  Service: Neurosurgery;  Laterality: N/A;  L2 Kyphoplasty  . KYPHOPLASTY N/A 09/07/2013   Procedure: L1 KYPHOPLASTY;  Surgeon: Barnett Abu, MD;  Location: [facility] NEURO ORS;  Service: Neurosurgery;  Laterality: N/A;  . KYPHOPLASTY N/A 10/04/2013   Procedure: T12 KYPHOPLASTY;  Surgeon: Barnett Abu, MD;  Location: [facility] NEURO ORS;  Service: Neurosurgery;  Laterality: N/A;  T12 KYPHOPLASTY  . PACEMAKER INSERTION  11/13/11   MDT implanted by Dr Royann Shivers for 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 observed on EP study  . PERMANENT PACEMAKER INSERTION N/A 11/13/2011   Procedure: PERMANENT PACEMAKER INSERTION;  Surgeon: Thurmon Fair, MD;  Location: [facility] CATH LAB;  Service: Cardiovascular;  Laterality: N/A;  . TEE WITHOUT CARDIOVERSION  11/11/2011   Procedure: TRANSESOPHAGEAL ECHOCARDIOGRAM (TEE);  Surgeon: Chrystie Nose, MD;  Location: [facility] ENDOSCOPY;  Service: Cardiovascular;  Laterality: N/A;  . TEMPORARY PACEMAKER INSERTION  11/12/2011   Procedure: TEMPORARY PACEMAKER INSERTION;  Surgeon: [name], MD;  Location: [facility] CATH LAB;  Service: Cardiovascular;;    Current Outpatient Prescriptions  Medication Sig Dispense Refill  . amLODipine (NORVASC) 5 MG tablet Take 1 tablet (5 mg total) by mouth daily. 90  tablet 3  . atorvastatin (LIPITOR) 40 MG tablet Take by mouth daily.  2  . brimonidine (ALPHAGAN P) 0.15 % ophthalmic solution Place 1 drop into the right eye 3 (three) times daily.     . Cholecalciferol (VITAMIN D3) 1000 UNITS CAPS Take 2,000 Units by mouth daily.     . dorzolamide (TRUSOPT) 2 % ophthalmic solution Place 1 drop into the right eye 3 (three) times daily.     . furosemide (LASIX) 40 MG tablet Take 1.5 tablets (60 mg total) by mouth daily. 135 tablet 3  . nitroGLYCERIN (NITROSTAT) 0.4 MG SL tablet Place 0.4 mg  under the tongue every 5 (five) minutes as needed. For chest pain.    Bertram Gala. Polyethyl Glycol-Propyl Glycol (SYSTANE OP) Place 1 drop into both eyes daily as needed (for dry eyes). For moisture.    . Travoprost (TRAVATAN Z OP) Place 1 drop into the right eye at bedtime. Right Eye only    . warfarin (COUMADIN) 5 MG tablet Take 2.5-5 mg by mouth See admin instructions.     Marland Kitchen. diltiazem (CARDIZEM) 30 MG tablet Take 1 tablet every 4 hours AS NEEDED for heart rate >100 as long as blood pressure >100. 45 tablet 2   No current facility-administered medications for this encounter.    Facility-Administered Medications Ordered in Other Encounters  Medication Dose Route Frequency Provider Last Rate Last Dose  . sodium chloride 0.9 % injection 3 mL  3 mL Intravenous PRN         Allergies  Allergen Reactions  . Azor [Amlodipine-Olmesartan] Other (See Comments)    Higher doses cause ankles to swell  . Beta Adrenergic Blockers Other (See Comments)    Bradycardia.  . Norvasc [Amlodipine Besylate] Other (See Comments)    edema  . Pradaxa [Dabigatran Etexilate Mesylate] Other (See Comments)    GI Bleed  . Questran [Cholestyramine] Other (See Comments)    Constipation  . Sulfa Drugs Cross Reactors Hives    Social History   Social History  . Marital status: Married    Spouse name: N/A  . Number of children: N/A  . Years of education: N/A   Occupational History  . Not on file.   Social History Main Topics  . Smoking status: Never Smoker  . Smokeless tobacco: Not on file  . Alcohol use No  . Drug use: No  . Sexual activity: Not Currently    Birth control/ protection: Post-menopausal   Other Topics Concern  . Not on file   Social History Narrative   [place]  Retired form [employer].   Attends [employer]    Family History  Problem Relation Age of Onset  . Heart disease Mother   . Heart disease Father   . Heart disease Brother   . Cancer Maternal Grandmother   .  Heart disease Maternal Grandfather   . Pneumonia Paternal Grandmother   . Pneumonia Paternal Grandfather   . Heart disease Brother   . Heart disease Brother   . Pneumonia Sister     ROS- All systems are reviewed and negative except as per the HPI above  Physical Exam: Vitals:   04/25/16 1553  BP: (!) 146/80  Pulse: 75  Weight: 112 lb 6.4 oz (51 kg)  Height: 5' 6.5" (1.689 m)    GEN- The patient is well appearing, alert and oriented x 3 today.   Head- normocephalic, atraumatic Eyes-  Sclera clear, conjunctiva pink Ears- hearing intact Oropharynx- clear Neck-  supple, no JVP Lymph- no cervical lymphadenopathy Lungs- Clear to ausculation bilaterally, normal work of breathing Heart- Regular rate and rhythm, no murmurs, rubs or gallops, PMI not laterally displaced GI- soft, NT, ND, + BS Extremities- no clubbing, cyanosis, or edema MS- no significant deformity or atrophy Skin- no rash or lesion Psych- euthymic mood, full affect Neuro- strength and sensation are intact  EKG-SR with LBBB at 75 bpm, pr int 166 ms, qrs int 132 ms, qtc 457 ms Epic records reviewed  Assessment and Plan: 1. PAF Rx given for cardizem 30 mg to use prn for episodes of afib with HR over 100 and Sys BP over 100 Warfarin for chadsvasc score of at least 5  2. PPM Per Dr. Johney Frame  3. HTN stable  4. CAD Stable  F/u afib clinic as needed Dr. Johney Frame  as scheduled 12/27  Elvina Sidle. [facility] [address] [place], [place] [address] [phone]

## 2016-05-07 DIAGNOSIS — Z7901 Long term (current) use of anticoagulants: Secondary | ICD-10-CM | POA: Diagnosis not present

## 2016-05-07 DIAGNOSIS — I4891 Unspecified atrial fibrillation: Secondary | ICD-10-CM | POA: Diagnosis not present

## 2016-05-16 DIAGNOSIS — E789 Disorder of lipoprotein metabolism, unspecified: Secondary | ICD-10-CM | POA: Diagnosis not present

## 2016-05-16 DIAGNOSIS — M810 Age-related osteoporosis without current pathological fracture: Secondary | ICD-10-CM | POA: Diagnosis not present

## 2016-05-16 DIAGNOSIS — Z0000 Encounter for general adult medical examination without abnormal findings: Secondary | ICD-10-CM | POA: Diagnosis not present

## 2016-05-16 DIAGNOSIS — Z0001 Encounter for general adult medical examination with abnormal findings: Secondary | ICD-10-CM | POA: Diagnosis not present

## 2016-05-20 DIAGNOSIS — I10 Essential (primary) hypertension: Secondary | ICD-10-CM | POA: Diagnosis not present

## 2016-05-20 DIAGNOSIS — R0989 Other specified symptoms and signs involving the circulatory and respiratory systems: Secondary | ICD-10-CM | POA: Diagnosis not present

## 2016-05-20 DIAGNOSIS — K5730 Diverticulosis of large intestine without perforation or abscess without bleeding: Secondary | ICD-10-CM | POA: Diagnosis not present

## 2016-05-20 DIAGNOSIS — Z23 Encounter for immunization: Secondary | ICD-10-CM | POA: Diagnosis not present

## 2016-05-20 DIAGNOSIS — Z0001 Encounter for general adult medical examination with abnormal findings: Secondary | ICD-10-CM | POA: Diagnosis not present

## 2016-05-21 ENCOUNTER — Encounter: Payer: Self-pay | Admitting: Nurse Practitioner

## 2016-05-21 DIAGNOSIS — M810 Age-related osteoporosis without current pathological fracture: Secondary | ICD-10-CM | POA: Diagnosis not present

## 2016-05-21 NOTE — Progress Notes (Signed)
This encounter was created in error - please disregard.  This encounter was created in error - please disregard.

## 2016-06-06 DIAGNOSIS — I4891 Unspecified atrial fibrillation: Secondary | ICD-10-CM | POA: Diagnosis not present

## 2016-06-06 DIAGNOSIS — Z7901 Long term (current) use of anticoagulants: Secondary | ICD-10-CM | POA: Diagnosis not present

## 2016-06-11 DIAGNOSIS — H04123 Dry eye syndrome of bilateral lacrimal glands: Secondary | ICD-10-CM | POA: Diagnosis not present

## 2016-06-11 DIAGNOSIS — H401113 Primary open-angle glaucoma, right eye, severe stage: Secondary | ICD-10-CM | POA: Diagnosis not present

## 2016-06-11 DIAGNOSIS — H43813 Vitreous degeneration, bilateral: Secondary | ICD-10-CM | POA: Diagnosis not present

## 2016-06-26 ENCOUNTER — Encounter (INDEPENDENT_AMBULATORY_CARE_PROVIDER_SITE_OTHER): Payer: Self-pay

## 2016-06-26 ENCOUNTER — Encounter: Payer: Medicare Other | Admitting: Internal Medicine

## 2016-06-26 ENCOUNTER — Encounter: Payer: Self-pay | Admitting: Internal Medicine

## 2016-06-26 ENCOUNTER — Ambulatory Visit (INDEPENDENT_AMBULATORY_CARE_PROVIDER_SITE_OTHER): Payer: Medicare Other | Admitting: Internal Medicine

## 2016-06-26 VITALS — BP 160/80 | HR 74 | Ht 65.0 in | Wt 113.0 lb

## 2016-06-26 DIAGNOSIS — I10 Essential (primary) hypertension: Secondary | ICD-10-CM | POA: Diagnosis not present

## 2016-06-26 DIAGNOSIS — I480 Paroxysmal atrial fibrillation: Secondary | ICD-10-CM

## 2016-06-26 DIAGNOSIS — I441 Atrioventricular block, second degree: Secondary | ICD-10-CM

## 2016-06-26 LAB — CUP PACEART INCLINIC DEVICE CHECK
Battery Impedance: 303 Ohm
Battery Remaining Longevity: 113 mo
Battery Voltage: 2.78 V
Brady Statistic AP VP Percent: 0 %
Brady Statistic AP VS Percent: 40 %
Brady Statistic AS VP Percent: 0 %
Brady Statistic AS VS Percent: 60 %
Date Time Interrogation Session: 20171227131834
Implantable Lead Implant Date: 20130515
Implantable Lead Implant Date: 20130515
Implantable Lead Location: 753859
Implantable Lead Location: 753860
Implantable Lead Model: 5076
Implantable Lead Model: 5076
Implantable Pulse Generator Implant Date: 20130515
Lead Channel Impedance Value: 424 Ohm
Lead Channel Impedance Value: 522 Ohm
Lead Channel Pacing Threshold Amplitude: 0.5 V
Lead Channel Pacing Threshold Amplitude: 0.5 V
Lead Channel Pacing Threshold Pulse Width: 0.4 ms
Lead Channel Pacing Threshold Pulse Width: 0.4 ms
Lead Channel Sensing Intrinsic Amplitude: 15.67 mV
Lead Channel Sensing Intrinsic Amplitude: 4 mV
Lead Channel Setting Pacing Amplitude: 2 V
Lead Channel Setting Pacing Amplitude: 2.5 V
Lead Channel Setting Pacing Pulse Width: 0.4 ms
Lead Channel Setting Sensing Sensitivity: 5.6 mV

## 2016-06-26 NOTE — Patient Instructions (Signed)
Medication Instructions:  Your physician recommends that you continue on your current medications as directed. Please refer to the Current Medication list given to you today.   Labwork: None ordered   Testing/Procedures: None ordered   Follow-Up: Remote monitoring is used to monitor your Pacemaker from home. This monitoring reduces the number of office visits required to check your device to one time per year. It allows [place] to keep an eye on the functioning of your device to ensure it is working properly. You are scheduled for a device check from home on 09/25/16 You may send your transmission at any time that day. If you have a wireless device, the transmission will be sent automatically. After your physician reviews your transmission, you will receive a postcard with your next transmission date.   Your physician wants you to follow-up in: 6 months with Rudi Coco, NP and 12 months with Dr Jacquiline Doe will receive a reminder letter in the mail two months in advance. If you don't receive a letter, please call our office to schedule the follow-up appointment.     Any Other Special Instructions Will Be Listed Below (If Applicable).     If you need a refill on your cardiac medications before your next appointment, please call your pharmacy.

## 2016-06-26 NOTE — Progress Notes (Signed)
PCP: Londell MohPHARR,WALTER DAVIDSON, MD  Michela PitcherFrances C [name] is a 82 y.o. female who presents today for routine electrophysiology followup.   She has had afib in October and December.  Both episodes were short.  She is otherwise doing well.  Today, she denies symptoms of palpitations, chest pain, shortness of breath, positive for some mild lower extremity edema.  no, dizziness, presyncope, or syncope.  The patient is otherwise without complaint today.   Past Medical History  Diagnosis Date  . PAF (paroxysmal atrial fibrillation)     takes Coumadin daily  . Glaucoma     uses Eye Drops daily  . LBBB (left bundle branch block)   . Anemia   . GI bleed     previously with pradaxa  . Hyperlipidemia     takes Atorvastatin daily  . Coronary disease     s/p PTCA 1990s  . 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 H/O hiatal hernia   . Hypertension     takes Azor and Apresoline daily  . GERD (gastroesophageal reflux disease)     takes Protonix daily  . Sore throat     since anesthesia in Feb 2015 AND ALLEGERIES   Past Surgical History  Procedure Laterality Date  . Coronary angioplasty  1990s    by Dr Aleen Campiysinger  . Colonoscopy    . Esophagogastrectomy    . Tee without cardioversion  11/11/2011    Procedure: TRANSESOPHAGEAL ECHOCARDIOGRAM (TEE);  Surgeon: Chrystie NoseKenneth C. Hilty, MD;  [facility];  Service: Cardiovascular;  Laterality: N/A;  . Atrial fibrillation ablation  11/12/11    PVI by Dr Johney FrameAllred  . Pacemaker insertion  11/13/11    MDT implanted by Dr Royann Shiversroitoru for 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 observed on EP study  . Cataract extraction w/ intraocular lens  implant, bilateral    . Eye surgery      GLAUCOMA SURG LT EYE  . Kyphoplasty N/A 08/09/2013    Procedure: LUMBAR TWO KYPHOPLASTY;  Surgeon: Barnett AbuHenry Elsner, MD;  Location: [facility] NEURO ORS;  Service: Neurosurgery;  Laterality: N/A;  L2 Kyphoplasty  . Kyphoplasty N/A 09/07/2013    Procedure: L1  KYPHOPLASTY;  Surgeon: Barnett AbuHenry Elsner, MD;  Location: [facility] NEURO ORS;  Service: Neurosurgery;  Laterality: N/A;  . Kyphoplasty N/A 10/04/2013    Procedure: T12 KYPHOPLASTY;  Surgeon: Barnett AbuHenry Elsner, MD;  Location: [facility] NEURO ORS;  Service: Neurosurgery;  Laterality: N/A;  T12 KYPHOPLASTY  . Atrial fibrillation ablation N/A 11/12/2011    Procedure: ATRIAL FIBRILLATION ABLATION;  Surgeon: Hillis RangeJames Imran Nuon, MD;  [facility];  Service: Cardiovascular;  Laterality: N/A;  . Temporary pacemaker insertion  11/12/2011    Procedure: TEMPORARY PACEMAKER INSERTION;  Surgeon: Hillis RangeJames Seleny Allbright, MD;  [facility];  Service: Cardiovascular;;  . Permanent pacemaker insertion N/A 11/13/2011    Procedure: PERMANENT PACEMAKER INSERTION;  Surgeon: Thurmon FairMihai Croitoru, MD;  Location: [facility] CATH LAB;  Service: Cardiovascular;  Laterality: N/A;    Current Outpatient Prescriptions:  .  amLODipine (NORVASC) 5 MG tablet, Take 1 tablet (5 mg total) by mouth daily., Disp: 90 tablet, Rfl: 3 .  atorvastatin (LIPITOR) 40 MG tablet, Take by mouth daily., Disp: , Rfl: 2 .  brimonidine (ALPHAGAN P) 0.15 % ophthalmic solution, Place 1 drop into the right eye 3 (three) times daily. , Disp: , Rfl:  .  Cholecalciferol (VITAMIN D3) 1000 UNITS CAPS, Take 2,000 Units by mouth daily. ,  Disp: , Rfl:  .  diltiazem (CARDIZEM) 30 MG tablet, Take 1 tablet every 4 hours AS NEEDED for heart rate >100 as long as blood pressure >100., Disp: 45 tablet, Rfl: 2 .  dorzolamide (TRUSOPT) 2 % ophthalmic solution, Place 1 drop into the right eye 3 (three) times daily. , Disp: , Rfl:  .  furosemide (LASIX) 40 MG tablet, Take 1.5 tablets (60 mg total) by mouth daily., Disp: 135 tablet, Rfl: 3 .  nitroGLYCERIN (NITROSTAT) 0.4 MG SL tablet, Place 0.4 mg under the tongue every 5 (five) minutes as needed. For chest pain., Disp: , Rfl:  .  Olopatadine HCl (PATADAY) 0.2 % SOLN, Apply 1 drop to eye daily., Disp: , Rfl:  .  Polyethyl Glycol-Propyl Glycol (SYSTANE OP),  Place 1 drop into both eyes daily as needed (for dry eyes). For moisture., Disp: , Rfl:  .  Travoprost (TRAVATAN Z OP), Place 1 drop into the right eye at bedtime. Right Eye only, Disp: , Rfl:  .  warfarin (COUMADIN) 5 MG tablet, Take 2.5-5 mg by mouth See admin instructions. , Disp: , Rfl:  No current facility-administered medications for this visit.   Facility-Administered Medications Ordered in Other Visits:  .  sodium chloride 0.9 % injection 3 mL, 3 mL, Intravenous, PRN,   Physical Exam: Vitals:   06/26/16 0953  BP: (!) 160/80  Pulse: 74   GEN- The patient is well appearing, alert and oriented x 3 today.   Head- normocephalic, atraumatic Eyes-  Sclera clear, conjunctiva pink Ears- hearing intact Oropharynx- clear Lungs- Clear to ausculation bilaterally, normal work of breathing Heart- Regular rate and rhythm, 2/6 SEM at the apex GI- soft, NT, ND, + BS Extremities- no clubbing, cyanosis, or edema  Pacemaker interrogation today is reviewed and normal (see paceart)   Assessment and Plan:  Paroxysmal atrial fibrillation  Doing well off AAD therapy No changes today  She will take cardizem as needed for afib with RVR  Second degree Mobitz II AV block  See Pace Art report  No changes today  HTN/ mild pedal edema She reports BP is controlled at home Does not wish to make changes today  [facility] Follow-up in AF clinic in 6 months Return to see me in one year.  [name] MD, [facility] 06/26/2016 10:36 AM

## 2016-07-05 ENCOUNTER — Encounter: Payer: Medicare Other | Admitting: Internal Medicine

## 2016-07-11 DIAGNOSIS — Z7901 Long term (current) use of anticoagulants: Secondary | ICD-10-CM | POA: Diagnosis not present

## 2016-07-11 DIAGNOSIS — I4891 Unspecified atrial fibrillation: Secondary | ICD-10-CM | POA: Diagnosis not present

## 2016-07-22 DIAGNOSIS — Z1231 Encounter for screening mammogram for malignant neoplasm of breast: Secondary | ICD-10-CM | POA: Diagnosis not present

## 2016-08-08 DIAGNOSIS — Z7901 Long term (current) use of anticoagulants: Secondary | ICD-10-CM | POA: Diagnosis not present

## 2016-08-08 DIAGNOSIS — I4891 Unspecified atrial fibrillation: Secondary | ICD-10-CM | POA: Diagnosis not present

## 2016-08-14 DIAGNOSIS — H43813 Vitreous degeneration, bilateral: Secondary | ICD-10-CM | POA: Diagnosis not present

## 2016-08-14 DIAGNOSIS — H401113 Primary open-angle glaucoma, right eye, severe stage: Secondary | ICD-10-CM | POA: Diagnosis not present

## 2016-08-27 DIAGNOSIS — M810 Age-related osteoporosis without current pathological fracture: Secondary | ICD-10-CM | POA: Diagnosis not present

## 2016-09-05 DIAGNOSIS — I4891 Unspecified atrial fibrillation: Secondary | ICD-10-CM | POA: Diagnosis not present

## 2016-09-05 DIAGNOSIS — Z7901 Long term (current) use of anticoagulants: Secondary | ICD-10-CM | POA: Diagnosis not present

## 2016-09-05 DIAGNOSIS — M810 Age-related osteoporosis without current pathological fracture: Secondary | ICD-10-CM | POA: Diagnosis not present

## 2016-09-19 DIAGNOSIS — Z7901 Long term (current) use of anticoagulants: Secondary | ICD-10-CM | POA: Diagnosis not present

## 2016-09-19 DIAGNOSIS — I4891 Unspecified atrial fibrillation: Secondary | ICD-10-CM | POA: Diagnosis not present

## 2016-09-25 ENCOUNTER — Ambulatory Visit (INDEPENDENT_AMBULATORY_CARE_PROVIDER_SITE_OTHER): Payer: Medicare Other | Admitting: *Deleted

## 2016-09-25 DIAGNOSIS — I441 Atrioventricular block, second degree: Secondary | ICD-10-CM | POA: Diagnosis not present

## 2016-09-25 NOTE — Progress Notes (Signed)
Remote pacemaker transmission.   

## 2016-09-26 LAB — CUP PACEART REMOTE DEVICE CHECK
Battery Impedance: 353 Ohm
Battery Remaining Longevity: 107 mo
Battery Voltage: 2.78 V
Brady Statistic AP VS Percent: 45 %
Date Time Interrogation Session: 20180328134318
Implantable Lead Implant Date: 20130515
Implantable Pulse Generator Implant Date: 20130515
Lead Channel Impedance Value: 435 Ohm
Lead Channel Impedance Value: 529 Ohm
Lead Channel Pacing Threshold Pulse Width: 0.4 ms
Lead Channel Pacing Threshold Pulse Width: 0.4 ms
Lead Channel Sensing Intrinsic Amplitude: 11.2 mV
Lead Channel Setting Pacing Amplitude: 2 V
Lead Channel Setting Pacing Pulse Width: 0.4 ms
Lead Channel Setting Sensing Sensitivity: 5.6 mV
MDC IDC LEAD IMPLANT DT: 20130515
MDC IDC LEAD LOCATION: 753859
MDC IDC LEAD LOCATION: 753860
MDC IDC MSMT LEADCHNL RA PACING THRESHOLD AMPLITUDE: 0.5 V
MDC IDC MSMT LEADCHNL RA SENSING INTR AMPL: 2.8 mV
MDC IDC MSMT LEADCHNL RV PACING THRESHOLD AMPLITUDE: 0.625 V
MDC IDC SET LEADCHNL RV PACING AMPLITUDE: 2.5 V
MDC IDC STAT BRADY AP VP PERCENT: 0 %
MDC IDC STAT BRADY AS VP PERCENT: 0 %
MDC IDC STAT BRADY AS VS PERCENT: 55 %

## 2016-09-27 ENCOUNTER — Encounter: Payer: Self-pay | Admitting: Cardiology

## 2016-10-17 DIAGNOSIS — I4891 Unspecified atrial fibrillation: Secondary | ICD-10-CM | POA: Diagnosis not present

## 2016-10-17 DIAGNOSIS — Z7901 Long term (current) use of anticoagulants: Secondary | ICD-10-CM | POA: Diagnosis not present

## 2016-11-14 DIAGNOSIS — Z7901 Long term (current) use of anticoagulants: Secondary | ICD-10-CM | POA: Diagnosis not present

## 2016-11-14 DIAGNOSIS — M810 Age-related osteoporosis without current pathological fracture: Secondary | ICD-10-CM | POA: Diagnosis not present

## 2016-11-14 DIAGNOSIS — I4891 Unspecified atrial fibrillation: Secondary | ICD-10-CM | POA: Diagnosis not present

## 2016-11-20 DIAGNOSIS — M810 Age-related osteoporosis without current pathological fracture: Secondary | ICD-10-CM | POA: Diagnosis not present

## 2016-12-12 DIAGNOSIS — Z7901 Long term (current) use of anticoagulants: Secondary | ICD-10-CM | POA: Diagnosis not present

## 2016-12-12 DIAGNOSIS — I4891 Unspecified atrial fibrillation: Secondary | ICD-10-CM | POA: Diagnosis not present

## 2016-12-25 ENCOUNTER — Encounter (HOSPITAL_COMMUNITY): Payer: Self-pay | Admitting: Nurse Practitioner

## 2016-12-25 ENCOUNTER — Ambulatory Visit (HOSPITAL_COMMUNITY)
Admission: RE | Admit: 2016-12-25 | Discharge: 2016-12-25 | Disposition: A | Discharge status: Home / Self Care | Payer: Medicare Other | Source: Ambulatory Visit | Attending: Nurse Practitioner | Admitting: Nurse Practitioner

## 2016-12-25 ENCOUNTER — Ambulatory Visit (INDEPENDENT_AMBULATORY_CARE_PROVIDER_SITE_OTHER): Payer: Medicare Other | Admitting: *Deleted

## 2016-12-25 VITALS — BP 142/78 | HR 75 | Ht 65.0 in | Wt 112.6 lb

## 2016-12-25 DIAGNOSIS — Z950 Presence of cardiac pacemaker: Secondary | ICD-10-CM | POA: Diagnosis not present

## 2016-12-25 DIAGNOSIS — I2510 Atherosclerotic heart disease of native coronary artery without angina pectoris: Secondary | ICD-10-CM | POA: Diagnosis not present

## 2016-12-25 DIAGNOSIS — I480 Paroxysmal atrial fibrillation: Secondary | ICD-10-CM

## 2016-12-25 DIAGNOSIS — K219 Gastro-esophageal reflux disease without esophagitis: Secondary | ICD-10-CM | POA: Insufficient documentation

## 2016-12-25 DIAGNOSIS — E785 Hyperlipidemia, unspecified: Secondary | ICD-10-CM | POA: Diagnosis not present

## 2016-12-25 DIAGNOSIS — Z7901 Long term (current) use of anticoagulants: Secondary | ICD-10-CM | POA: Insufficient documentation

## 2016-12-25 DIAGNOSIS — Z79899 Other long term (current) drug therapy: Secondary | ICD-10-CM | POA: Diagnosis not present

## 2016-12-25 DIAGNOSIS — I10 Essential (primary) hypertension: Secondary | ICD-10-CM | POA: Insufficient documentation

## 2016-12-25 DIAGNOSIS — I441 Atrioventricular block, second degree: Secondary | ICD-10-CM | POA: Diagnosis not present

## 2016-12-25 NOTE — Progress Notes (Signed)
Primary Care Physician: [name], MD Referring Physician:Dr. Johney Frame   Rhonda [name] is a 83 y.o. female with h/o PPM, paroxsymal  afib in the afib clinic for evaluation. She had one episode that lasted 6 hours but at the time was eating a lot of dark chocolate.  She has done better since decreasing intake of this. Overall, she feels well.   Today, she denies symptoms of palpitations, chest pain, shortness of breath, orthopnea, PND, lower extremity edema, dizziness, presyncope, syncope, or neurologic sequela. The patient is tolerating medications without difficulties and is otherwise without complaint today.   Past Medical History:  Diagnosis Date  . Anemia   . Bilateral carotid bruits   . Coronary disease    s/p PTCA 1990s  . GERD (gastroesophageal reflux disease)    takes Protonix daily  . GI bleed    previously with pradaxa  . Glaucoma    uses Eye Drops daily  . H/O hiatal hernia   . Hyperlipidemia    takes Atorvastatin daily  . Hypertension    takes Azor and Apresoline daily  . LBBB (left bundle branch block)   . Pacemaker    MDT Advisa May 2013, Dr. Salena Saner  . PAF (paroxysmal atrial fibrillation) [facility])    PVI ablation May 2013, takes Coumadin daily  . Sore throat    since anesthesia in Feb 2015 AND ALLEGERIES   Past Surgical History:  Procedure Laterality Date  . atrial fibrillation ablation  11/12/11   PVI by Dr Johney Frame  . ATRIAL FIBRILLATION ABLATION N/A 11/12/2011   Procedure: ATRIAL FIBRILLATION ABLATION;  Surgeon: [name], MD;  Location: [facility] CATH LAB;  Service: Cardiovascular;  Laterality: N/A;  . CATARACT EXTRACTION W/ INTRAOCULAR LENS  IMPLANT, BILATERAL    . COLONOSCOPY    . CORONARY ANGIOPLASTY  1990s   by Dr Aleen Campi  . ESOPHAGOGASTRECTOMY    . EYE SURGERY     GLAUCOMA SURG LT EYE  . KYPHOPLASTY N/A 08/09/2013   Procedure: LUMBAR TWO KYPHOPLASTY;  Surgeon: Barnett Abu, MD;  [facility] NEUR[facility]S;  Service: Neurosurgery;  Laterality: N/A;  L2  Kyphoplasty  . KYPHOPLASTY N/A 09/07/2013   Procedure: L1 KYPHOPLASTY;  Surgeon: Barnett Abu, MD;  [facility]: [facility] NEURO [facility]S;  Service: Neurosurgery;  Laterality: N/A;  . KYPHOPLASTY N/A 10/04/2013   Procedure: T12 KYPHOPLASTY;  Surgeon: Barnett Abu, MD;  [facility]: [facility] NEURO [facility]S;  Service: Neurosurgery;  Laterality: N/A;  T12 KYPHOPLASTY  . PACEMAKER INSERTION  11/13/11   MDT implanted by Dr Royann Shivers for 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 observed on EP study  . PERMANENT PACEMAKER INSERTION N/A 11/13/2011   Procedure: PERMANENT PACEMAKER INSERTION;  Surgeon: Thurmon Fair, MD;  [facility]: [facility] C[facility] LAB;  Service: Cardiovascular;  Laterality: N/A;  . TEE WITHOUT CARDIOVERSION  11/11/2011   Procedure: TRANSESOPHAGEAL ECHOCARDIOGRAM (TEE);  Surgeon: Chrystie Nose, MD;  Location: [facility] ENDOSCOPY;  Service: Cardiovascular;  Laterality: N/A;  . TEMPORARY PACEMAKER INSERTION  11/12/2011   Procedure: TEMPORARY PACEMAKER INSERTION;  Surgeon: [name], MD;  Location: [facility] CATH LAB;  Service: Cardiovascular;;    Current Outpatient Prescriptions  Medication Sig Dispense Refill  . amLODipine (NORVASC) 5 MG tablet Take 1 tablet (5 mg total) by mouth daily. 90 tablet 3  . atorvastatin (LIPITOR) 40 MG tablet Take by mouth daily.  2  . brimonidine (ALPHAGAN P) 0.15 % ophthalmic solution Place 1 drop into the right eye 3 (three) times daily.     Marland Kitchen  Cholecalciferol (VITAMIN D3) 1000 UNITS CAPS Take 2,000 Units by mouth daily.     Marland Kitchen denosumab (PROLIA) 60 MG/ML SOLN injection Inject 60 mg into the skin every 6 (six) months. Administer in upper arm, thigh, or abdomen    . dorzolamide (TRUSOPT) 2 % ophthalmic solution Place 1 drop into the right eye 3 (three) times daily.     . furosemide (LASIX) 40 MG tablet Take 1.5 tablets (60 mg total) by mouth daily. 135 tablet 3  . nitroGLYCERIN (NITROSTAT) 0.4 MG SL tablet Place 0.4 mg under the tongue every 5 (five) minutes as needed. For chest pain.    Marland Kitchen Olopatadine HCl  (PATADAY) 0.2 % SOLN Apply 1 drop to eye daily.    Bertram Gala Glycol-Propyl Glycol (SYSTANE OP) Place 1 drop into both eyes daily as needed (for dry eyes). For moisture.    . Travoprost (TRAVATAN Z OP) Place 1 drop into the right eye at bedtime. Right Eye only    . warfarin (COUMADIN) 5 MG tablet Take 2.5-5 mg by mouth See admin instructions.     Marland Kitchen diltiazem (CARDIZEM) 30 MG tablet Take 1 tablet every 4 hours AS NEEDED for heart rate >100 as long as blood pressure >100. (Patient not taking: Reported on 12/25/2016) 45 tablet 2   No current facility-administered medications for this encounter.    Facility-Administered Medications Ordered in Other Encounters  Medication Dose Route Frequency Provider Last Rate Last Dose  . sodium chloride 0.9 % injection 3 mL  3 mL Intravenous PRN         Allergies  Allergen Reactions  . Azor [Amlodipine-Olmesartan] Other (See Comments)    Higher doses cause ankles to swell  . Beta Adrenergic Blockers Other (See Comments)    Bradycardia.  . Norvasc [Amlodipine Besylate] Other (See Comments)    edema  . Pradaxa [Dabigatran Etexilate Mesylate] Other (See Comments)    GI Bleed  . Questran [Cholestyramine] Other (See Comments)    Constipation  . Sulfa Drugs Cross Reactors Hives    Social History   Social History  . Marital status: Married    Spouse name: N/A  . Number of children: N/A  . Years of education: N/A   Occupational History  . Not on file.   Social History Main Topics  . Smoking status: Never Smoker  . Smokeless tobacco: Never Used  . Alcohol use No  . Drug use: No  . Sexual activity: Not Currently    Birth control/ protection: Post-menopausal   Other Topics Concern  . Not on file   Social History Narrative   Pt lives in [place].  Retired form [employer].   Attends [employer]    Family History  Problem Relation Age of Onset  . Heart disease Mother   . Heart disease Father   . Heart disease Brother   . Cancer  Maternal Grandmother   . Heart disease Maternal Grandfather   . Pneumonia Paternal Grandmother   . Pneumonia Paternal Grandfather   . Heart disease Brother   . Heart disease Brother   . Pneumonia Sister     ROS- All systems are reviewed and negative except as per the HPI above  Physical Exam: Vitals:   12/25/16 1400  BP: (!) 142/78  Pulse: 75  Weight: 112 lb 9.6 oz (51.1 kg)  Height: 5\' 5"  (1.651 m)    GEN- The patient is well appearing, alert and oriented x 3 today.   Head- normocephalic, atraumatic Eyes-  Sclera clear,  conjunctiva pink Ears- hearing intact Oropharynx- clear Neck- supple, no JVP Lymph- no cervical lymphadenopathy Lungs- Clear to ausculation bilaterally, normal work of breathing Heart- Regular rate and rhythm, no murmurs, rubs or gallops, PMI not laterally displaced GI- soft, NT, ND, + BS Extremities- no clubbing, cyanosis, or edema MS- no significant deformity or atrophy Skin- no rash or lesion Psych- euthymic mood, full affect Neuro- strength and sensation are intact  EKG-SR with LBBB at 75 bpm, pr int 166 ms, qrs int 132 ms, qtc 457 ms Epic records reviewed  Assessment and Plan: 1. PAF Low burden Rx given for cardizem 30 mg to use prn for episodes of afib with HR over 100 and Sys BP over 100 Warfarin for chadsvasc score of at least 5  2. PPM Per Dr. Johney Frame  3. HTN stable  4. CAD Stable  F/u afib clinic as needed Dr. Johney Frame  as scheduled  in December  Hillel Card C. [facility] [address] [place], [place] [address] [phone]

## 2016-12-26 ENCOUNTER — Encounter: Payer: Self-pay | Admitting: Cardiology

## 2016-12-26 NOTE — Progress Notes (Signed)
Remote pacemaker transmission.   

## 2017-01-07 LAB — CUP PACEART REMOTE DEVICE CHECK
Brady Statistic AP VS Percent: 43 %
Brady Statistic AS VP Percent: 0 %
Brady Statistic AS VS Percent: 57 %
Date Time Interrogation Session: 20180627131824
Implantable Lead Location: 753859
Implantable Lead Location: 753860
Implantable Lead Model: 5076
Lead Channel Impedance Value: 680 Ohm
Lead Channel Pacing Threshold Amplitude: 0.5 V
Lead Channel Pacing Threshold Amplitude: 0.625 V
Lead Channel Pacing Threshold Pulse Width: 0.4 ms
Lead Channel Pacing Threshold Pulse Width: 0.4 ms
MDC IDC LEAD IMPLANT DT: 20130515
MDC IDC LEAD IMPLANT DT: 20130515
MDC IDC MSMT BATTERY IMPEDANCE: 377 Ohm
MDC IDC MSMT BATTERY REMAINING LONGEVITY: 106 mo
MDC IDC MSMT BATTERY VOLTAGE: 2.77 V
MDC IDC MSMT LEADCHNL RA IMPEDANCE VALUE: 436 Ohm
MDC IDC PG IMPLANT DT: 20130515
MDC IDC SET LEADCHNL RA PACING AMPLITUDE: 2 V
MDC IDC SET LEADCHNL RV PACING AMPLITUDE: 2.5 V
MDC IDC SET LEADCHNL RV PACING PULSEWIDTH: 0.4 ms
MDC IDC SET LEADCHNL RV SENSING SENSITIVITY: 5.6 mV
MDC IDC STAT BRADY AP VP PERCENT: 0 %

## 2017-01-09 DIAGNOSIS — I4891 Unspecified atrial fibrillation: Secondary | ICD-10-CM | POA: Diagnosis not present

## 2017-01-09 DIAGNOSIS — Z7901 Long term (current) use of anticoagulants: Secondary | ICD-10-CM | POA: Diagnosis not present

## 2017-02-06 DIAGNOSIS — Z7901 Long term (current) use of anticoagulants: Secondary | ICD-10-CM | POA: Diagnosis not present

## 2017-02-06 DIAGNOSIS — I4891 Unspecified atrial fibrillation: Secondary | ICD-10-CM | POA: Diagnosis not present

## 2017-02-11 DIAGNOSIS — L821 Other seborrheic keratosis: Secondary | ICD-10-CM | POA: Diagnosis not present

## 2017-02-11 DIAGNOSIS — D225 Melanocytic nevi of trunk: Secondary | ICD-10-CM | POA: Diagnosis not present

## 2017-02-11 DIAGNOSIS — L814 Other melanin hyperpigmentation: Secondary | ICD-10-CM | POA: Diagnosis not present

## 2017-02-19 DIAGNOSIS — H401132 Primary open-angle glaucoma, bilateral, moderate stage: Secondary | ICD-10-CM | POA: Diagnosis not present

## 2017-03-06 DIAGNOSIS — I4891 Unspecified atrial fibrillation: Secondary | ICD-10-CM | POA: Diagnosis not present

## 2017-03-06 DIAGNOSIS — Z7901 Long term (current) use of anticoagulants: Secondary | ICD-10-CM | POA: Diagnosis not present

## 2017-03-26 ENCOUNTER — Ambulatory Visit (INDEPENDENT_AMBULATORY_CARE_PROVIDER_SITE_OTHER): Payer: Medicare Other | Admitting: *Deleted

## 2017-03-26 DIAGNOSIS — I441 Atrioventricular block, second degree: Secondary | ICD-10-CM

## 2017-03-26 DIAGNOSIS — I480 Paroxysmal atrial fibrillation: Secondary | ICD-10-CM | POA: Diagnosis not present

## 2017-03-26 NOTE — Progress Notes (Signed)
Remote pacemaker transmission.   

## 2017-03-27 ENCOUNTER — Encounter: Payer: Self-pay | Admitting: Cardiology

## 2017-03-27 LAB — CUP PACEART REMOTE DEVICE CHECK
Battery Remaining Longevity: 102 mo
Brady Statistic AP VP Percent: 0 %
Implantable Lead Implant Date: 20130515
Implantable Lead Location: 753859
Implantable Pulse Generator Implant Date: 20130515
Lead Channel Impedance Value: 448 Ohm
Lead Channel Pacing Threshold Amplitude: 0.875 V
Lead Channel Pacing Threshold Pulse Width: 0.4 ms
Lead Channel Setting Pacing Amplitude: 2 V
Lead Channel Setting Sensing Sensitivity: 5.6 mV
MDC IDC LEAD IMPLANT DT: 20130515
MDC IDC LEAD LOCATION: 753860
MDC IDC MSMT BATTERY IMPEDANCE: 426 Ohm
MDC IDC MSMT BATTERY VOLTAGE: 2.78 V
MDC IDC MSMT LEADCHNL RA PACING THRESHOLD AMPLITUDE: 0.5 V
MDC IDC MSMT LEADCHNL RA PACING THRESHOLD PULSEWIDTH: 0.4 ms
MDC IDC MSMT LEADCHNL RV IMPEDANCE VALUE: 838 Ohm
MDC IDC SESS DTM: 20180926141231
MDC IDC SET LEADCHNL RV PACING AMPLITUDE: 2.5 V
MDC IDC SET LEADCHNL RV PACING PULSEWIDTH: 0.4 ms
MDC IDC STAT BRADY AP VS PERCENT: 40 %
MDC IDC STAT BRADY AS VP PERCENT: 0 %
MDC IDC STAT BRADY AS VS PERCENT: 60 %

## 2017-04-03 DIAGNOSIS — Z23 Encounter for immunization: Secondary | ICD-10-CM | POA: Diagnosis not present

## 2017-04-03 DIAGNOSIS — I4891 Unspecified atrial fibrillation: Secondary | ICD-10-CM | POA: Diagnosis not present

## 2017-04-03 DIAGNOSIS — Z7901 Long term (current) use of anticoagulants: Secondary | ICD-10-CM | POA: Diagnosis not present

## 2017-04-21 DIAGNOSIS — Z7901 Long term (current) use of anticoagulants: Secondary | ICD-10-CM | POA: Diagnosis not present

## 2017-04-21 DIAGNOSIS — I4891 Unspecified atrial fibrillation: Secondary | ICD-10-CM | POA: Diagnosis not present

## 2017-05-19 DIAGNOSIS — Z7901 Long term (current) use of anticoagulants: Secondary | ICD-10-CM | POA: Diagnosis not present

## 2017-05-19 DIAGNOSIS — I10 Essential (primary) hypertension: Secondary | ICD-10-CM | POA: Diagnosis not present

## 2017-05-19 DIAGNOSIS — I4891 Unspecified atrial fibrillation: Secondary | ICD-10-CM | POA: Diagnosis not present

## 2017-05-26 DIAGNOSIS — I10 Essential (primary) hypertension: Secondary | ICD-10-CM | POA: Diagnosis not present

## 2017-05-26 DIAGNOSIS — Z0000 Encounter for general adult medical examination without abnormal findings: Secondary | ICD-10-CM | POA: Diagnosis not present

## 2017-05-26 DIAGNOSIS — E789 Disorder of lipoprotein metabolism, unspecified: Secondary | ICD-10-CM | POA: Diagnosis not present

## 2017-05-29 DIAGNOSIS — H409 Unspecified glaucoma: Secondary | ICD-10-CM | POA: Diagnosis not present

## 2017-05-29 DIAGNOSIS — Z0001 Encounter for general adult medical examination with abnormal findings: Secondary | ICD-10-CM | POA: Diagnosis not present

## 2017-05-29 DIAGNOSIS — R918 Other nonspecific abnormal finding of lung field: Secondary | ICD-10-CM | POA: Diagnosis not present

## 2017-05-30 ENCOUNTER — Encounter: Payer: Self-pay | Admitting: Internal Medicine

## 2017-05-30 DIAGNOSIS — H401123 Primary open-angle glaucoma, left eye, severe stage: Secondary | ICD-10-CM | POA: Diagnosis not present

## 2017-05-30 DIAGNOSIS — H401112 Primary open-angle glaucoma, right eye, moderate stage: Secondary | ICD-10-CM | POA: Diagnosis not present

## 2017-06-02 DIAGNOSIS — I4891 Unspecified atrial fibrillation: Secondary | ICD-10-CM | POA: Diagnosis not present

## 2017-06-02 DIAGNOSIS — Z7901 Long term (current) use of anticoagulants: Secondary | ICD-10-CM | POA: Diagnosis not present

## 2017-06-02 DIAGNOSIS — N390 Urinary tract infection, site not specified: Secondary | ICD-10-CM | POA: Diagnosis not present

## 2017-06-02 DIAGNOSIS — R319 Hematuria, unspecified: Secondary | ICD-10-CM | POA: Diagnosis not present

## 2017-06-02 DIAGNOSIS — R3129 Other microscopic hematuria: Secondary | ICD-10-CM | POA: Diagnosis not present

## 2017-06-03 DIAGNOSIS — M810 Age-related osteoporosis without current pathological fracture: Secondary | ICD-10-CM | POA: Diagnosis not present

## 2017-06-09 ENCOUNTER — Encounter: Payer: Medicare Other | Admitting: Internal Medicine

## 2017-06-26 ENCOUNTER — Ambulatory Visit (INDEPENDENT_AMBULATORY_CARE_PROVIDER_SITE_OTHER): Payer: Medicare Other | Admitting: *Deleted

## 2017-06-26 DIAGNOSIS — I441 Atrioventricular block, second degree: Secondary | ICD-10-CM | POA: Diagnosis not present

## 2017-06-26 NOTE — Progress Notes (Signed)
Remote pacemaker transmission.   

## 2017-06-27 ENCOUNTER — Encounter: Payer: Self-pay | Admitting: Cardiology

## 2017-06-28 LAB — CUP PACEART REMOTE DEVICE CHECK
Battery Remaining Longevity: 99 mo
Brady Statistic AP VS Percent: 40 %
Brady Statistic AS VP Percent: 0 %
Date Time Interrogation Session: 20181227154601
Implantable Lead Implant Date: 20130515
Implantable Lead Implant Date: 20130515
Implantable Lead Location: 753860
Lead Channel Pacing Threshold Amplitude: 0.5 V
Lead Channel Pacing Threshold Amplitude: 0.875 V
Lead Channel Pacing Threshold Pulse Width: 0.4 ms
Lead Channel Pacing Threshold Pulse Width: 0.4 ms
Lead Channel Setting Pacing Amplitude: 2 V
Lead Channel Setting Pacing Amplitude: 2.5 V
Lead Channel Setting Sensing Sensitivity: 5.6 mV
MDC IDC LEAD LOCATION: 753859
MDC IDC MSMT BATTERY IMPEDANCE: 450 Ohm
MDC IDC MSMT BATTERY VOLTAGE: 2.77 V
MDC IDC MSMT LEADCHNL RA IMPEDANCE VALUE: 424 Ohm
MDC IDC MSMT LEADCHNL RA SENSING INTR AMPL: 2.8 mV
MDC IDC MSMT LEADCHNL RV IMPEDANCE VALUE: 859 Ohm
MDC IDC MSMT LEADCHNL RV SENSING INTR AMPL: 11.2 mV
MDC IDC PG IMPLANT DT: 20130515
MDC IDC SET LEADCHNL RV PACING PULSEWIDTH: 0.4 ms
MDC IDC STAT BRADY AP VP PERCENT: 0 %
MDC IDC STAT BRADY AS VS PERCENT: 60 %

## 2017-06-30 ENCOUNTER — Ambulatory Visit: Payer: Medicare Other | Admitting: Internal Medicine

## 2017-06-30 ENCOUNTER — Encounter: Payer: Self-pay | Admitting: Internal Medicine

## 2017-06-30 VITALS — BP 158/88 | HR 72 | Ht 64.5 in | Wt 108.8 lb

## 2017-06-30 DIAGNOSIS — I441 Atrioventricular block, second degree: Secondary | ICD-10-CM

## 2017-06-30 DIAGNOSIS — I480 Paroxysmal atrial fibrillation: Secondary | ICD-10-CM

## 2017-06-30 LAB — CUP PACEART INCLINIC DEVICE CHECK
Battery Impedance: 401 Ohm
Battery Remaining Longevity: 104 mo
Battery Voltage: 2.78 V
Brady Statistic AP VP Percent: 0 %
Date Time Interrogation Session: 20181231135401
Implantable Lead Implant Date: 20130515
Implantable Lead Implant Date: 20130515
Implantable Lead Model: 5076
Implantable Pulse Generator Implant Date: 20130515
Lead Channel Impedance Value: 877 Ohm
Lead Channel Pacing Threshold Amplitude: 0.5 V
Lead Channel Pacing Threshold Pulse Width: 0.4 ms
Lead Channel Pacing Threshold Pulse Width: 0.4 ms
Lead Channel Sensing Intrinsic Amplitude: 15.67 mV
Lead Channel Setting Pacing Amplitude: 2 V
Lead Channel Setting Sensing Sensitivity: 5.6 mV
MDC IDC LEAD LOCATION: 753859
MDC IDC LEAD LOCATION: 753860
MDC IDC MSMT LEADCHNL RA IMPEDANCE VALUE: 447 Ohm
MDC IDC MSMT LEADCHNL RA PACING THRESHOLD AMPLITUDE: 0.5 V
MDC IDC MSMT LEADCHNL RA SENSING INTR AMPL: 4 mV
MDC IDC MSMT LEADCHNL RV PACING THRESHOLD AMPLITUDE: 0.75 V
MDC IDC MSMT LEADCHNL RV PACING THRESHOLD AMPLITUDE: 1 V
MDC IDC MSMT LEADCHNL RV PACING THRESHOLD PULSEWIDTH: 0.4 ms
MDC IDC MSMT LEADCHNL RV PACING THRESHOLD PULSEWIDTH: 0.4 ms
MDC IDC SET LEADCHNL RV PACING AMPLITUDE: 2.5 V
MDC IDC SET LEADCHNL RV PACING PULSEWIDTH: 0.4 ms
MDC IDC STAT BRADY AP VS PERCENT: 40 %
MDC IDC STAT BRADY AS VP PERCENT: 0 %
MDC IDC STAT BRADY AS VS PERCENT: 60 %

## 2017-06-30 MED ORDER — DILTIAZEM HCL 30 MG PO TABS: ORAL_TABLET | ORAL | 2 refills | Status: DC

## 2017-06-30 NOTE — Patient Instructions (Signed)
Medication Instructions:  Your physician recommends that you continue on your current medications as directed. Please refer to the Current Medication list given to you today.   Labwork: None ordered   Testing/Procedures: None ordered   Follow-Up: Your physician wants you to follow-up in: 12 months with Rhonda Balsam, NP You will receive a reminder letter in the mail two months in advance. If you don't receive a letter, please call our office to schedule the follow-up appointment.  Remote monitoring is used to monitor your Pacemaker from home. This monitoring reduces the number of office visits required to check your device to one time per year. It allows [place] to keep an eye on the functioning of your device to ensure it is working properly. You are scheduled for a device check from home on 09/25/17. You may send your transmission at any time that day. If you have a wireless device, the transmission will be sent automatically. After your physician reviews your transmission, you will receive a postcard with your next transmission date.     Any Other Special Instructions Will Be Listed Below (If Applicable).     If you need a refill on your cardiac medications before your next appointment, please call your pharmacy.

## 2017-06-30 NOTE — Progress Notes (Signed)
PCP: Merri BrunettePharr, Walter, MD   Primary EP: Dr Johney FrameAllred  Michela PitcherFrances C [name] is a 83 y.o. female who presents today for routine electrophysiology followup.  Since last being seen in our clinic, the patient reports doing very well.  Today, she denies symptoms of palpitations, chest pain, shortness of breath,  lower extremity edema, dizziness, presyncope, or syncope.  The patient is otherwise without complaint today.   Past Medical History:  Diagnosis Date  . Anemia   . Bilateral carotid bruits   . Coronary disease    s/p PTCA 1990s  . GERD (gastroesophageal reflux disease)    takes Protonix daily  . GI bleed    previously with pradaxa  . Glaucoma    uses Eye Drops daily  . H/O hiatal hernia   . Hyperlipidemia    takes Atorvastatin daily  . Hypertension    takes Azor and Apresoline daily  . LBBB (left bundle branch block)   . Pacemaker    MDT Advisa May 2013, Dr. Salena Saner  . PAF (paroxysmal atrial fibrillation) [facility](HCC)    PVI ablation May 2013, takes Coumadin daily  . Sore throat    since anesthesia in Feb 2015 AND ALLEGERIES   Past Surgical History:  Procedure Laterality Date  . atrial fibrillation ablation  11/12/11   PVI by Dr Johney FrameAllred  . ATRIAL FIBRILLATION ABLATION N/A 11/12/2011   Procedure: ATRIAL FIBRILLATION ABLATION;  Surgeon: Hillis RangeJames Nihal Marzella, MD;  [facility];  Service: Cardiovascular;  Laterality: N/A;  . CATARACT EXTRACTION W/ INTRAOCULAR LENS  IMPLANT, BILATERAL    . COLONOSCOPY    . CORONARY ANGIOPLASTY  1990s   by Dr Aleen Campiysinger  . ESOPHAGOGASTRECTOMY    . EYE SURGERY     GLAUCOMA SURG LT EYE  . KYPHOPLASTY N/A 08/09/2013   Procedure: LUMBAR TWO KYPHOPLASTY;  Surgeon: Barnett AbuHenry Elsner, MD;  Location: [facility] NEURO ORS;  Service: Neurosurgery;  Laterality: N/A;  L2 Kyphoplasty  . KYPHOPLASTY N/A 09/07/2013   Procedure: L1 KYPHOPLASTY;  Surgeon: Barnett AbuHenry Elsner, MD;  Location: [facility] NEURO ORS;  Service: Neurosurgery;  Laterality: N/A;  . KYPHOPLASTY N/A 10/04/2013   Procedure: T12 KYPHOPLASTY;   Surgeon: Barnett AbuHenry Elsner, MD;  Location: [facility] NEURO ORS;  Service: Neurosurgery;  Laterality: N/A;  T12 KYPHOPLASTY  . PACEMAKER INSERTION  11/13/11   MDT implanted by Dr Royann Shiversroitoru for 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 observed on EP study  . PERMANENT PACEMAKER INSERTION N/A 11/13/2011   Procedure: PERMANENT PACEMAKER INSERTION;  Surgeon: Thurmon FairMihai Croitoru, MD;  Location: [facility] C[facility];  Service: Cardiovascular;  Laterality: N/A;  . TEE WITHOUT CARDIOVERSION  11/11/2011   Procedure: TRANSESOPHAGEAL ECHOCARDIOGRAM (TEE);  Surgeon: Chrystie NoseKenneth C. Hilty, MD;  [facility];  Service: Cardiovascular;  Laterality: N/A;  . TEMPORARY PACEMAKER INSERTION  11/12/2011   Procedure: TEMPORARY PACEMAKER INSERTION;  Surgeon: Hillis RangeJames Jahzeel Poythress, MD;  [facility];  Service: Cardiovascular;;    ROS- all systems are reviewed and negatives except as per HPI above  Current Outpatient Medications  Medication Sig Dispense Refill  . amLODipine (NORVASC) 5 MG tablet Take 1 tablet (5 mg total) by mouth daily. 90 tablet 3  . atorvastatin (LIPITOR) 40 MG tablet Take by mouth daily.  2  . brimonidine (ALPHAGAN P) 0.15 % ophthalmic solution Place 1 drop into the right eye 3 (three) times daily.     . Cholecalciferol (VITAMIN D3) 1000 UNITS CAPS Take 2,000 Units by mouth daily.     Marland Kitchen. denosumab (PROLIA) 60 MG/ML SOLN  injection Inject 60 mg into the skin every 6 (six) months. Administer in upper arm, thigh, or abdomen    . diltiazem (CARDIZEM) 30 MG tablet Take 1 tablet every 4 hours AS NEEDED for heart rate >100 as long as blood pressure >100. 45 tablet 2  . dorzolamide (TRUSOPT) 2 % ophthalmic solution Place 1 drop into the right eye 3 (three) times daily.     . furosemide (LASIX) 40 MG tablet Take 1.5 tablets (60 mg total) by mouth daily. 135 tablet 3  . nitroGLYCERIN (NITROSTAT) 0.4 MG SL tablet Place 0.4 mg under the tongue every 5 (five) minutes as needed. For chest pain.    Marland Kitchen PAZEO 0.7 % SOLN Use as directed  5    . Polyethyl Glycol-Propyl Glycol (SYSTANE OP) Place 1 drop into both eyes daily as needed (for dry eyes). For moisture.    . Travoprost (TRAVATAN Z OP) Place 1 drop into the right eye at bedtime. Right Eye only    . warfarin (COUMADIN) 5 MG tablet Take 2.5-5 mg by mouth See admin instructions.      No current facility-administered medications for this visit.     Physical Exam: Vitals:   06/30/17 1212  BP: (!) 158/88  Pulse: 72  Weight: 108 lb 12.8 oz (49.4 kg)  Height: 5' 4.5" (1.638 m)    GEN- The patient is thin appearing, alert and oriented x 3 today.   Head- normocephalic, atraumatic Eyes-  Sclera clear, conjunctiva pink Ears- hearing intact Oropharynx- clear Lungs- Clear to ausculation bilaterally, normal work of breathing Heart- Regular rate and rhythm, no murmurs, rubs or gallops, PMI not laterally displaced GI- soft, NT, ND, + BS Extremities- no clubbing, cyanosis, or edema Skin- PPM pocket is well healed  EKG tracing ordered today is personally reviewed and shows sinus rhythm with LBBB  Assessment and Plan:  1. Paroxysmal atrial fibrillation Doing well off AAD therapy No changes afib burden by PPM is 0.2%.  She is very pleased with her results post ablation. She has prn cardizem which she can take if needed.  2. 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 HTN Stable No change required today  4. CAD No ischemic symptoms No changes  [facility] Return to see EP NP in a year  [name] MD, [facility] 06/30/2017 12:32 PM

## 2017-07-03 DIAGNOSIS — Z7901 Long term (current) use of anticoagulants: Secondary | ICD-10-CM | POA: Diagnosis not present

## 2017-07-03 DIAGNOSIS — I4891 Unspecified atrial fibrillation: Secondary | ICD-10-CM | POA: Diagnosis not present

## 2017-07-23 ENCOUNTER — Telehealth (HOSPITAL_COMMUNITY): Payer: Self-pay | Admitting: *Deleted

## 2017-07-23 NOTE — Telephone Encounter (Signed)
Continues in afib HR in the 120-130s. Pt feels ok BP still around 115/85. Will continue cardizem 30mg  every 4 hours as needed for elevated HR and will see tomorrow.

## 2017-07-23 NOTE — Telephone Encounter (Signed)
Patients husband called in stating patient woke up in afib this morning HR in the 90-130s BP 118/69 he has given her 2 doses of 30mg  cardizem since 5am. Instructed to hold giving anymore cardizem at this time and give the current doses time to work. He will call back around lunch time with update of heart rates and next steps if she is still out of rhythm. Pt is asymptomatic other than noticing the "heart racing".

## 2017-07-24 ENCOUNTER — Telehealth (HOSPITAL_COMMUNITY): Payer: Self-pay | Admitting: *Deleted

## 2017-07-24 ENCOUNTER — Ambulatory Visit (HOSPITAL_COMMUNITY): Payer: Medicare Other | Admitting: Nurse Practitioner

## 2017-07-24 NOTE — Telephone Encounter (Signed)
Pt spouse cld to report pt went back into NSR late yesterday evening.  Spoke with Rivka Safer and appt for today's visit canceled

## 2017-07-31 DIAGNOSIS — I4891 Unspecified atrial fibrillation: Secondary | ICD-10-CM | POA: Diagnosis not present

## 2017-07-31 DIAGNOSIS — Z7901 Long term (current) use of anticoagulants: Secondary | ICD-10-CM | POA: Diagnosis not present

## 2017-08-12 DIAGNOSIS — Z1231 Encounter for screening mammogram for malignant neoplasm of breast: Secondary | ICD-10-CM | POA: Diagnosis not present

## 2017-08-28 DIAGNOSIS — Z7901 Long term (current) use of anticoagulants: Secondary | ICD-10-CM | POA: Diagnosis not present

## 2017-08-28 DIAGNOSIS — I4891 Unspecified atrial fibrillation: Secondary | ICD-10-CM | POA: Diagnosis not present

## 2017-09-22 ENCOUNTER — Other Ambulatory Visit: Payer: Self-pay

## 2017-09-22 DIAGNOSIS — L821 Other seborrheic keratosis: Secondary | ICD-10-CM | POA: Diagnosis not present

## 2017-09-22 DIAGNOSIS — L720 Epidermal cyst: Secondary | ICD-10-CM | POA: Diagnosis not present

## 2017-09-22 DIAGNOSIS — D485 Neoplasm of uncertain behavior of skin: Secondary | ICD-10-CM | POA: Diagnosis not present

## 2017-09-25 ENCOUNTER — Ambulatory Visit (INDEPENDENT_AMBULATORY_CARE_PROVIDER_SITE_OTHER): Payer: Medicare Other | Admitting: *Deleted

## 2017-09-25 DIAGNOSIS — I441 Atrioventricular block, second degree: Secondary | ICD-10-CM | POA: Diagnosis not present

## 2017-09-25 DIAGNOSIS — I4891 Unspecified atrial fibrillation: Secondary | ICD-10-CM | POA: Diagnosis not present

## 2017-09-25 DIAGNOSIS — Z7901 Long term (current) use of anticoagulants: Secondary | ICD-10-CM | POA: Diagnosis not present

## 2017-09-26 NOTE — Progress Notes (Signed)
Remote pacemaker transmission.   

## 2017-09-29 ENCOUNTER — Encounter: Payer: Self-pay | Admitting: Cardiology

## 2017-10-07 LAB — CUP PACEART REMOTE DEVICE CHECK
Battery Remaining Longevity: 93 mo
Brady Statistic AP VP Percent: 0 %
Brady Statistic AS VP Percent: 0 %
Implantable Lead Implant Date: 20130515
Implantable Lead Implant Date: 20130515
Implantable Lead Location: 753859
Implantable Lead Model: 5076
Implantable Lead Model: 5076
Implantable Pulse Generator Implant Date: 20130515
Lead Channel Impedance Value: 460 Ohm
Lead Channel Pacing Threshold Amplitude: 0.5 V
Lead Channel Pacing Threshold Amplitude: 0.875 V
Lead Channel Setting Pacing Amplitude: 2 V
MDC IDC LEAD LOCATION: 753860
MDC IDC MSMT BATTERY IMPEDANCE: 526 Ohm
MDC IDC MSMT BATTERY VOLTAGE: 2.78 V
MDC IDC MSMT LEADCHNL RA PACING THRESHOLD PULSEWIDTH: 0.4 ms
MDC IDC MSMT LEADCHNL RV IMPEDANCE VALUE: 934 Ohm
MDC IDC MSMT LEADCHNL RV PACING THRESHOLD PULSEWIDTH: 0.4 ms
MDC IDC SESS DTM: 20190328143819
MDC IDC SET LEADCHNL RV PACING AMPLITUDE: 2.5 V
MDC IDC SET LEADCHNL RV PACING PULSEWIDTH: 0.4 ms
MDC IDC SET LEADCHNL RV SENSING SENSITIVITY: 5.6 mV
MDC IDC STAT BRADY AP VS PERCENT: 41 %
MDC IDC STAT BRADY AS VS PERCENT: 59 %

## 2017-10-22 DIAGNOSIS — H401123 Primary open-angle glaucoma, left eye, severe stage: Secondary | ICD-10-CM | POA: Diagnosis not present

## 2017-10-22 DIAGNOSIS — H401112 Primary open-angle glaucoma, right eye, moderate stage: Secondary | ICD-10-CM | POA: Diagnosis not present

## 2017-10-27 DIAGNOSIS — Z7901 Long term (current) use of anticoagulants: Secondary | ICD-10-CM | POA: Diagnosis not present

## 2017-10-27 DIAGNOSIS — I4891 Unspecified atrial fibrillation: Secondary | ICD-10-CM | POA: Diagnosis not present

## 2017-11-25 DIAGNOSIS — Z7901 Long term (current) use of anticoagulants: Secondary | ICD-10-CM | POA: Diagnosis not present

## 2017-11-25 DIAGNOSIS — I4891 Unspecified atrial fibrillation: Secondary | ICD-10-CM | POA: Diagnosis not present

## 2017-12-02 DIAGNOSIS — I10 Essential (primary) hypertension: Secondary | ICD-10-CM | POA: Diagnosis not present

## 2017-12-03 DIAGNOSIS — M810 Age-related osteoporosis without current pathological fracture: Secondary | ICD-10-CM | POA: Diagnosis not present

## 2017-12-09 DIAGNOSIS — I10 Essential (primary) hypertension: Secondary | ICD-10-CM | POA: Diagnosis not present

## 2017-12-09 DIAGNOSIS — M810 Age-related osteoporosis without current pathological fracture: Secondary | ICD-10-CM | POA: Diagnosis not present

## 2017-12-23 DIAGNOSIS — Z7901 Long term (current) use of anticoagulants: Secondary | ICD-10-CM | POA: Diagnosis not present

## 2017-12-23 DIAGNOSIS — I4891 Unspecified atrial fibrillation: Secondary | ICD-10-CM | POA: Diagnosis not present

## 2017-12-25 ENCOUNTER — Encounter: Payer: Self-pay | Admitting: Cardiology

## 2017-12-25 ENCOUNTER — Ambulatory Visit (INDEPENDENT_AMBULATORY_CARE_PROVIDER_SITE_OTHER): Payer: Medicare Other | Admitting: *Deleted

## 2017-12-25 DIAGNOSIS — I441 Atrioventricular block, second degree: Secondary | ICD-10-CM | POA: Diagnosis not present

## 2017-12-25 NOTE — Progress Notes (Signed)
Remote pacemaker transmission.   

## 2017-12-26 LAB — CUP PACEART REMOTE DEVICE CHECK
Battery Remaining Longevity: 93 mo
Brady Statistic AP VP Percent: 0 %
Brady Statistic AP VS Percent: 38 %
Brady Statistic AS VS Percent: 62 %
Date Time Interrogation Session: 20190627140918
Implantable Lead Implant Date: 20130515
Implantable Lead Location: 753859
Implantable Lead Location: 753860
Implantable Lead Model: 5076
Implantable Pulse Generator Implant Date: 20130515
Lead Channel Impedance Value: 939 Ohm
Lead Channel Pacing Threshold Amplitude: 1 V
Lead Channel Pacing Threshold Pulse Width: 0.4 ms
Lead Channel Setting Pacing Amplitude: 2 V
Lead Channel Setting Sensing Sensitivity: 5.6 mV
MDC IDC LEAD IMPLANT DT: 20130515
MDC IDC MSMT BATTERY IMPEDANCE: 525 Ohm
MDC IDC MSMT BATTERY VOLTAGE: 2.78 V
MDC IDC MSMT LEADCHNL RA IMPEDANCE VALUE: 429 Ohm
MDC IDC MSMT LEADCHNL RA PACING THRESHOLD AMPLITUDE: 0.5 V
MDC IDC MSMT LEADCHNL RA PACING THRESHOLD PULSEWIDTH: 0.4 ms
MDC IDC SET LEADCHNL RV PACING AMPLITUDE: 2.5 V
MDC IDC SET LEADCHNL RV PACING PULSEWIDTH: 0.4 ms
MDC IDC STAT BRADY AS VP PERCENT: 0 %

## 2018-01-20 DIAGNOSIS — I4891 Unspecified atrial fibrillation: Secondary | ICD-10-CM | POA: Diagnosis not present

## 2018-01-20 DIAGNOSIS — Z7901 Long term (current) use of anticoagulants: Secondary | ICD-10-CM | POA: Diagnosis not present

## 2018-02-17 DIAGNOSIS — Z7901 Long term (current) use of anticoagulants: Secondary | ICD-10-CM | POA: Diagnosis not present

## 2018-02-17 DIAGNOSIS — I4891 Unspecified atrial fibrillation: Secondary | ICD-10-CM | POA: Diagnosis not present

## 2018-03-10 DIAGNOSIS — H401123 Primary open-angle glaucoma, left eye, severe stage: Secondary | ICD-10-CM | POA: Diagnosis not present

## 2018-03-10 DIAGNOSIS — H401112 Primary open-angle glaucoma, right eye, moderate stage: Secondary | ICD-10-CM | POA: Diagnosis not present

## 2018-03-12 ENCOUNTER — Telehealth (HOSPITAL_COMMUNITY): Payer: Self-pay | Admitting: *Deleted

## 2018-03-12 NOTE — Telephone Encounter (Signed)
Patient husband called in stating pt has been in afib since about 6am. HR in the 120s BP normal. She has been taking the PRN cardizem every 4 hours today. Encouraged to continue this and hopefully it will break the afib and call tomorrow if still in AF. Husband in agreement.

## 2018-03-13 NOTE — Telephone Encounter (Signed)
Patient's husband called back in this morning stating patient converted overnight and HR is in the 60s BP 113/74 this morning. Feeling well. Will call if needed.

## 2018-03-17 DIAGNOSIS — I4891 Unspecified atrial fibrillation: Secondary | ICD-10-CM | POA: Diagnosis not present

## 2018-03-17 DIAGNOSIS — Z7901 Long term (current) use of anticoagulants: Secondary | ICD-10-CM | POA: Diagnosis not present

## 2018-03-26 ENCOUNTER — Ambulatory Visit (INDEPENDENT_AMBULATORY_CARE_PROVIDER_SITE_OTHER): Payer: Medicare Other | Admitting: *Deleted

## 2018-03-26 DIAGNOSIS — I441 Atrioventricular block, second degree: Secondary | ICD-10-CM | POA: Diagnosis not present

## 2018-03-26 NOTE — Progress Notes (Signed)
Remote pacemaker transmission.   

## 2018-03-27 ENCOUNTER — Encounter: Payer: Self-pay | Admitting: Cardiology

## 2018-03-30 DIAGNOSIS — L0109 Other impetigo: Secondary | ICD-10-CM | POA: Diagnosis not present

## 2018-03-30 DIAGNOSIS — D1801 Hemangioma of skin and subcutaneous tissue: Secondary | ICD-10-CM | POA: Diagnosis not present

## 2018-03-30 DIAGNOSIS — D229 Melanocytic nevi, unspecified: Secondary | ICD-10-CM | POA: Diagnosis not present

## 2018-03-30 DIAGNOSIS — L821 Other seborrheic keratosis: Secondary | ICD-10-CM | POA: Diagnosis not present

## 2018-04-14 DIAGNOSIS — Z23 Encounter for immunization: Secondary | ICD-10-CM | POA: Diagnosis not present

## 2018-04-14 DIAGNOSIS — I4891 Unspecified atrial fibrillation: Secondary | ICD-10-CM | POA: Diagnosis not present

## 2018-04-14 DIAGNOSIS — Z7901 Long term (current) use of anticoagulants: Secondary | ICD-10-CM | POA: Diagnosis not present

## 2018-04-20 DIAGNOSIS — L309 Dermatitis, unspecified: Secondary | ICD-10-CM | POA: Diagnosis not present

## 2018-05-05 LAB — CUP PACEART REMOTE DEVICE CHECK
Battery Impedance: 575 Ohm
Battery Remaining Longevity: 90 mo
Battery Voltage: 2.77 V
Brady Statistic AP VS Percent: 39 %
Date Time Interrogation Session: 20190926131921
Implantable Lead Implant Date: 20130515
Implantable Lead Implant Date: 20130515
Implantable Lead Location: 753860
Implantable Pulse Generator Implant Date: 20130515
Lead Channel Pacing Threshold Pulse Width: 0.4 ms
Lead Channel Pacing Threshold Pulse Width: 0.4 ms
Lead Channel Setting Pacing Amplitude: 2 V
Lead Channel Setting Pacing Amplitude: 2.5 V
Lead Channel Setting Pacing Pulse Width: 0.4 ms
MDC IDC LEAD LOCATION: 753859
MDC IDC MSMT LEADCHNL RA IMPEDANCE VALUE: 430 Ohm
MDC IDC MSMT LEADCHNL RA PACING THRESHOLD AMPLITUDE: 0.5 V
MDC IDC MSMT LEADCHNL RV IMPEDANCE VALUE: 1025 Ohm
MDC IDC MSMT LEADCHNL RV PACING THRESHOLD AMPLITUDE: 1 V
MDC IDC SET LEADCHNL RV SENSING SENSITIVITY: 5.6 mV
MDC IDC STAT BRADY AP VP PERCENT: 0 %
MDC IDC STAT BRADY AS VP PERCENT: 0 %
MDC IDC STAT BRADY AS VS PERCENT: 61 %

## 2018-05-12 DIAGNOSIS — Z7901 Long term (current) use of anticoagulants: Secondary | ICD-10-CM | POA: Diagnosis not present

## 2018-05-12 DIAGNOSIS — I4891 Unspecified atrial fibrillation: Secondary | ICD-10-CM | POA: Diagnosis not present

## 2018-06-03 DIAGNOSIS — I10 Essential (primary) hypertension: Secondary | ICD-10-CM | POA: Diagnosis not present

## 2018-06-03 DIAGNOSIS — M810 Age-related osteoporosis without current pathological fracture: Secondary | ICD-10-CM | POA: Diagnosis not present

## 2018-06-03 DIAGNOSIS — E559 Vitamin D deficiency, unspecified: Secondary | ICD-10-CM | POA: Diagnosis not present

## 2018-06-08 DIAGNOSIS — Z0001 Encounter for general adult medical examination with abnormal findings: Secondary | ICD-10-CM | POA: Diagnosis not present

## 2018-06-08 DIAGNOSIS — M810 Age-related osteoporosis without current pathological fracture: Secondary | ICD-10-CM | POA: Diagnosis not present

## 2018-06-08 DIAGNOSIS — Z0000 Encounter for general adult medical examination without abnormal findings: Secondary | ICD-10-CM | POA: Diagnosis not present

## 2018-06-08 DIAGNOSIS — R636 Underweight: Secondary | ICD-10-CM | POA: Diagnosis not present

## 2018-06-08 DIAGNOSIS — I10 Essential (primary) hypertension: Secondary | ICD-10-CM | POA: Diagnosis not present

## 2018-06-09 DIAGNOSIS — I4891 Unspecified atrial fibrillation: Secondary | ICD-10-CM | POA: Diagnosis not present

## 2018-06-09 DIAGNOSIS — Z7901 Long term (current) use of anticoagulants: Secondary | ICD-10-CM | POA: Diagnosis not present

## 2018-06-25 ENCOUNTER — Ambulatory Visit (INDEPENDENT_AMBULATORY_CARE_PROVIDER_SITE_OTHER): Payer: Medicare Other

## 2018-06-25 DIAGNOSIS — I441 Atrioventricular block, second degree: Secondary | ICD-10-CM

## 2018-06-25 NOTE — Progress Notes (Signed)
Remote pacemaker transmission.   

## 2018-06-27 LAB — CUP PACEART REMOTE DEVICE CHECK
Battery Remaining Longevity: 86 mo
Brady Statistic AP VP Percent: 0 %
Date Time Interrogation Session: 20191226145108
Implantable Lead Implant Date: 20130515
Implantable Lead Location: 753859
Implantable Lead Location: 753860
Implantable Lead Model: 5076
Implantable Pulse Generator Implant Date: 20130515
Lead Channel Impedance Value: 442 Ohm
Lead Channel Pacing Threshold Amplitude: 1 V
Lead Channel Setting Pacing Amplitude: 2 V
Lead Channel Setting Pacing Amplitude: 2.5 V
Lead Channel Setting Pacing Pulse Width: 0.4 ms
Lead Channel Setting Sensing Sensitivity: 5.6 mV
MDC IDC LEAD IMPLANT DT: 20130515
MDC IDC MSMT BATTERY IMPEDANCE: 625 Ohm
MDC IDC MSMT BATTERY VOLTAGE: 2.78 V
MDC IDC MSMT LEADCHNL RA PACING THRESHOLD AMPLITUDE: 0.5 V
MDC IDC MSMT LEADCHNL RA PACING THRESHOLD PULSEWIDTH: 0.4 ms
MDC IDC MSMT LEADCHNL RV IMPEDANCE VALUE: 958 Ohm
MDC IDC MSMT LEADCHNL RV PACING THRESHOLD PULSEWIDTH: 0.4 ms
MDC IDC STAT BRADY AP VS PERCENT: 38 %
MDC IDC STAT BRADY AS VP PERCENT: 0 %
MDC IDC STAT BRADY AS VS PERCENT: 62 %

## 2018-07-02 ENCOUNTER — Ambulatory Visit: Payer: Medicare Other | Admitting: Nurse Practitioner

## 2018-07-03 ENCOUNTER — Other Ambulatory Visit: Payer: Self-pay | Admitting: Internal Medicine

## 2018-07-03 ENCOUNTER — Other Ambulatory Visit (HOSPITAL_COMMUNITY): Payer: Self-pay | Admitting: *Deleted

## 2018-07-03 MED ORDER — DILTIAZEM HCL 30 MG PO TABS: ORAL_TABLET | ORAL | 3 refills | Status: DC

## 2018-07-07 DIAGNOSIS — Z7901 Long term (current) use of anticoagulants: Secondary | ICD-10-CM | POA: Diagnosis not present

## 2018-07-07 DIAGNOSIS — I4891 Unspecified atrial fibrillation: Secondary | ICD-10-CM | POA: Diagnosis not present

## 2018-07-07 DIAGNOSIS — I10 Essential (primary) hypertension: Secondary | ICD-10-CM | POA: Diagnosis not present

## 2018-07-21 DIAGNOSIS — Z7901 Long term (current) use of anticoagulants: Secondary | ICD-10-CM | POA: Diagnosis not present

## 2018-07-21 DIAGNOSIS — I4891 Unspecified atrial fibrillation: Secondary | ICD-10-CM | POA: Diagnosis not present

## 2018-08-13 NOTE — Progress Notes (Signed)
Electrophysiology Office Note Date: 08/14/2018  ID:  Phillip HealFrances C Wherry, DOB 1930-01-03, MRN [id number]  PCP: Merri BrunettePharr, Walter, MD Electrophysiologist: Johney FrameAllred  CC: AF follow up  Rhonda [name] is a 85 y.o. female seen today for Dr Johney FrameAllred.  She presents today for routine electrophysiology followup.  Since last being seen in our clinic, the patient reports doing very well.  She denies chest pain, palpitations, dyspnea, PND, orthopnea, nausea, vomiting, dizziness, syncope, edema, weight gain, or early satiety.  Past Medical History:  Diagnosis Date  . Anemia   . Bilateral carotid bruits   . Coronary disease    s/p PTCA 1990s  . GERD (gastroesophageal reflux disease)    takes Protonix daily  . GI bleed    previously with pradaxa  . Glaucoma    uses Eye Drops daily  . H/O hiatal hernia   . Hyperlipidemia    takes Atorvastatin daily  . Hypertension    takes Azor and Apresoline daily  . LBBB (left bundle branch block)   . Pacemaker    MDT Advisa May 2013, Dr. Salena Saner  . PAF (paroxysmal atrial fibrillation) [facility](HCC)    PVI ablation May 2013, takes Coumadin daily  . Sore throat    since anesthesia in Feb 2015 AND ALLEGERIES   Past Surgical History:  Procedure Laterality Date  . atrial fibrillation ablation  11/12/11   PVI by Dr Johney FrameAllred  . ATRIAL FIBRILLATION ABLATION N/A 11/12/2011   Procedure: ATRIAL FIBRILLATION ABLATION;  Surgeon: Hillis RangeJames Allred, MD;  [facility];  Service: Cardiovascular;  Laterality: N/A;  . CATARACT EXTRACTION W/ INTRAOCULAR LENS  IMPLANT, BILATERAL    . COLONOSCOPY    . CORONARY ANGIOPLASTY  1990s   by Dr Aleen Campiysinger  . ESOPHAGOGASTRECTOMY    . EYE SURGERY     GLAUCOMA SURG LT EYE  . KYPHOPLASTY N/A 08/09/2013   Procedure: LUMBAR TWO KYPHOPLASTY;  Surgeon: Barnett AbuHenry Elsner, MD;  [facility]: [facility];  Service: Neurosurgery;  Laterality: N/A;  L2 Kyphoplasty  . KYPHOPLASTY N/A 09/07/2013   Procedure: L1 KYPHOPLASTY;  Surgeon: Barnett AbuHenry Elsner, MD;  [facility]: [facility]  NEURO ORS;  Service: Neurosurgery;  Laterality: N/A;  . KYPHOPLASTY N/A 10/04/2013   Procedure: T12 KYPHOPLASTY;  Surgeon: Barnett AbuHenry Elsner, MD;  Location: [facility] NEUR[facility]S;  Service: Neurosurgery;  Laterality: N/A;  T12 KYPHOPLASTY  . PACEMAKER INSERTION  11/13/11   MDT implanted by Dr Royann Shiversroitoru for 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 observed on EP study  . PERMANENT PACEMAKER INSERTION N/A 11/13/2011   Procedure: PERMANENT PACEMAKER INSERTION;  Surgeon: Thurmon FairMihai Croitoru, MD;  Location: [facility];  Service: Cardiovascular;  Laterality: N/A;  . TEE WITHOUT CARDIOVERSION  11/11/2011   Procedure: TRANSESOPHAGEAL ECHOCARDIOGRAM (TEE);  Surgeon: Chrystie NoseKenneth C. Hilty, MD;  [facility];  Service: Cardiovascular;  Laterality: N/A;  . TEMPORARY PACEMAKER INSERTION  11/12/2011   Procedure: TEMPORARY PACEMAKER INSERTION;  Surgeon: Hillis RangeJames Allred, MD;  [facility];  Service: Cardiovascular;;    Current Outpatient Medications  Medication Sig Dispense Refill  . amLODipine (NORVASC) 5 MG tablet Take 1 tablet (5 mg total) by mouth daily. 90 tablet 3  . atorvastatin (LIPITOR) 40 MG tablet Take by mouth daily.  2  . brimonidine (ALPHAGAN P) 0.15 % ophthalmic solution Place 1 drop into the right eye 3 (three) times daily.     . Cholecalciferol (VITAMIN D3) 1000 UNITS CAPS Take 2,000 Units by mouth daily.     Marland Kitchen. denosumab (PROLIA) 60  MG/ML SOLN injection Inject 60 mg into the skin every 6 (six) months. Administer in upper arm, thigh, or abdomen    . diltiazem (CARDIZEM) 30 MG tablet take 1 tablet every 4 hours AS NEEDED for heart rate >100 as long as blood pressure >100. 45 tablet 3  . dorzolamide (TRUSOPT) 2 % ophthalmic solution Place 1 drop into the right eye 3 (three) times daily.     . furosemide (LASIX) 40 MG tablet Take 1.5 tablets (60 mg total) by mouth daily. 135 tablet 3  . nitroGLYCERIN (NITROSTAT) 0.4 MG SL tablet Place 0.4 mg under the tongue every 5 (five) minutes as needed. For chest pain.      Marland Kitchen. PAZEO 0.7 % SOLN Use as directed  5  . Polyethyl Glycol-Propyl Glycol (SYSTANE OP) Place 1 drop into both eyes daily as needed (for dry eyes). For moisture.    . Travoprost (TRAVATAN Z OP) Place 1 drop into the right eye at bedtime. Right Eye only    . warfarin (COUMADIN) 5 MG tablet Take 2.5-5 mg by mouth See admin instructions.      No current facility-administered medications for this visit.     Allergies:   Azor [amlodipine-olmesartan]; Beta adrenergic blockers; Norvasc [amlodipine besylate]; Pradaxa [dabigatran etexilate mesylate]; Questran [cholestyramine]; and Sulfa drugs cross reactors   Social History: Social History   Socioeconomic History  . Marital status: Married    Spouse name: Not on file  . Number of children: Not on file  . Years of education: Not on file  . Highest education level: Not on file  Occupational History  . Not on file  Social Needs  . Financial resource strain: Not on file  . Food insecurity:    Worry: Not on file    Inability: Not on file  . Transportation needs:    Medical: Not on file    Non-medical: Not on file  Tobacco Use  . Smoking status: Never Smoker  . Smokeless tobacco: Never Used  Substance and Sexual Activity  . Alcohol use: No  . Drug use: No  . Sexual activity: Not Currently    Birth control/protection: Post-menopausal  Lifestyle  . Physical activity:    Days per week: Not on file    Minutes per session: Not on file  . Stress: Not on file  Relationships  . Social connections:    Talks on phone: Not on file    Gets together: Not on file    Attends religious service: Not on file    Active member of club or organization: Not on file    Attends meetings of clubs or organizations: Not on file    Relationship status: Not on file  . Intimate partner violence:    Fear of current or ex partner: Not on file    Emotionally abused: Not on file    Physically abused: Not on file    Forced sexual activity: Not on file  Other  Topics Concern  . Not on file  Social History Narrative   [place]  Retired form [employer].   Attends [employer]    Family History: Family History  Problem Relation Age of Onset  . Heart disease Mother   . Heart disease Father   . Heart disease Brother   . Cancer Maternal Grandmother   . Heart disease Maternal Grandfather   . Pneumonia Paternal Grandmother   . Pneumonia Paternal Grandfather   . Heart disease Brother   . Heart  disease Brother   . Pneumonia Sister     Review of Systems: All other systems reviewed and are otherwise negative except as noted above.   Physical Exam: VS:  BP 136/88   Pulse 75   Ht 5' 4.5" (1.638 m)   Wt 101 lb (45.8 kg)   SpO2 98%   BMI 17.07 kg/m  , BMI Body mass index is 17.07 kg/m. Wt Readings from Last 3 Encounters:  08/14/18 101 lb (45.8 kg)  06/30/17 108 lb 12.8 oz (49.4 kg)  12/25/16 112 lb 9.6 oz (51.1 kg)    GEN- The patient is well appearing, alert and oriented x 3 today.   HEENT: normocephalic, atraumatic; sclera clear, conjunctiva pink; hearing intact; oropharynx clear; neck supple, no JVP Lymph- no cervical lymphadenopathy Lungs- Clear to ausculation bilaterally, normal work of breathing.  No wheezes, rales, rhonchi Heart- Regular rate and rhythm  GI- soft, non-tender, non-distended, bowel sounds present, no hepatosplenomegaly Extremities- no clubbing, cyanosis, or edema; DP/PT/radial pulses 2+ bilaterally MS- no significant deformity or atrophy Skin- warm and dry, no rash or lesion  Psych- euthymic mood, full affect Neuro- strength and sensation are intact   EKG:  EKG is ordered today. The ekg ordered today shows sinus rhythm, IVCD, rate 75  Recent Labs: No results found for requested labs within last 8760 hours.    Other studies Reviewed: Additional studies/ records that were reviewed today include: Dr Jenel Lucks office notes   Assessment and Plan:  1.  Mobitz II Normal pacemaker  function See Paceart report No changes today  2.  Paroxysmal atrial fibrillation Burden by device interrogation 0.5% Continue Warfarin for CHADS2VASC of 4 INR followed by PCP   3.  HTN Stable No change required today  4.  CAD No recent ischemic symptoms   Current medicines are reviewed at length with the patient today.   The patient does not have concerns regarding her medicines.  The following changes were made today:  none  Labs/ tests ordered today include: none Orders Placed This Encounter  Procedures  . EKG 12-Lead     Disposition:   Follow up with [facility], Dr Johney Frame 1 year    Signed, Gypsy Balsam, NP 08/14/2018 11:58 AM   [facility] [address] [place] [address] [phone] (office) [phone] (fax)

## 2018-08-14 ENCOUNTER — Encounter: Payer: Self-pay | Admitting: Nurse Practitioner

## 2018-08-14 ENCOUNTER — Ambulatory Visit: Payer: Medicare Other | Admitting: Nurse Practitioner

## 2018-08-14 VITALS — BP 136/88 | HR 75 | Ht 64.5 in | Wt 101.0 lb

## 2018-08-14 DIAGNOSIS — I441 Atrioventricular block, second degree: Secondary | ICD-10-CM

## 2018-08-14 DIAGNOSIS — I10 Essential (primary) hypertension: Secondary | ICD-10-CM

## 2018-08-14 DIAGNOSIS — I480 Paroxysmal atrial fibrillation: Secondary | ICD-10-CM | POA: Diagnosis not present

## 2018-08-14 NOTE — Patient Instructions (Signed)
Medication Instructions:  none If you need a refill on your cardiac medications before your next appointment, please call your pharmacy.   Lab work: none If you have labs (blood work) drawn today and your tests are completely normal, you will receive your results only by: Marland Kitchen MyChart Message (if you have MyChart) OR . A paper copy in the mail If you have any lab test that is abnormal or we need to change your treatment, we will call you to review the results.  Testing/Procedures: none  Follow-Up: At [facility], you and your health needs are our priority.  As part of our continuing mission to provide you with exceptional heart care, we have created designated Provider Care Teams.  These Care Teams include your primary Cardiologist (physician) and Advanced Practice Providers (APPs -  Physician Assistants and Nurse Practitioners) who all work together to provide you with the care you need, when you need it. You will need a follow up appointment in 1 years.  Please call our office 2 months in advance to schedule this appointment.  You may see Dr Johney Frame or one of the following Advanced Practice Providers on your designated Care Team:   Gypsy Balsam, NP . Francis Dowse, PA-C  Any Other Special Instructions Will Be Listed Below (If Applicable). Remote monitoring is used to monitor your Pacemaker  from home. This monitoring reduces the number of office visits required to check your device to one time per year. It allows [place] to keep an eye on the functioning of your device to ensure it is working properly. You are scheduled for a device check from home on 09/24/18. You may send your transmission at any time that day. If you have a wireless device, the transmission will be sent automatically. After your physician reviews your transmission, you will receive a postcard with your next transmission date.

## 2018-08-17 DIAGNOSIS — Z7901 Long term (current) use of anticoagulants: Secondary | ICD-10-CM | POA: Diagnosis not present

## 2018-08-17 DIAGNOSIS — I4891 Unspecified atrial fibrillation: Secondary | ICD-10-CM | POA: Diagnosis not present

## 2018-08-18 DIAGNOSIS — H401123 Primary open-angle glaucoma, left eye, severe stage: Secondary | ICD-10-CM | POA: Diagnosis not present

## 2018-08-18 DIAGNOSIS — H401112 Primary open-angle glaucoma, right eye, moderate stage: Secondary | ICD-10-CM | POA: Diagnosis not present

## 2018-09-14 DIAGNOSIS — M810 Age-related osteoporosis without current pathological fracture: Secondary | ICD-10-CM | POA: Diagnosis not present

## 2018-09-14 DIAGNOSIS — I4891 Unspecified atrial fibrillation: Secondary | ICD-10-CM | POA: Diagnosis not present

## 2018-09-14 DIAGNOSIS — Z7901 Long term (current) use of anticoagulants: Secondary | ICD-10-CM | POA: Diagnosis not present

## 2018-09-24 ENCOUNTER — Ambulatory Visit (INDEPENDENT_AMBULATORY_CARE_PROVIDER_SITE_OTHER): Payer: Medicare Other | Admitting: *Deleted

## 2018-09-24 ENCOUNTER — Other Ambulatory Visit: Payer: Self-pay

## 2018-09-24 DIAGNOSIS — I441 Atrioventricular block, second degree: Secondary | ICD-10-CM

## 2018-09-24 LAB — CUP PACEART REMOTE DEVICE CHECK
Battery Impedance: 753 Ohm
Brady Statistic AP VS Percent: 36 %
Brady Statistic AS VP Percent: 0 %
Brady Statistic AS VS Percent: 64 %
Implantable Lead Implant Date: 20130515
Implantable Lead Location: 753859
Implantable Lead Model: 5076
Implantable Lead Model: 5076
Lead Channel Impedance Value: 820 Ohm
Lead Channel Pacing Threshold Amplitude: 0.375 V
Lead Channel Pacing Threshold Amplitude: 0.875 V
Lead Channel Pacing Threshold Pulse Width: 0.4 ms
Lead Channel Pacing Threshold Pulse Width: 0.4 ms
Lead Channel Setting Sensing Sensitivity: 5.6 mV
MDC IDC LEAD IMPLANT DT: 20130515
MDC IDC LEAD LOCATION: 753860
MDC IDC MSMT BATTERY REMAINING LONGEVITY: 79 mo
MDC IDC MSMT BATTERY VOLTAGE: 2.77 V
MDC IDC MSMT LEADCHNL RA IMPEDANCE VALUE: 448 Ohm
MDC IDC PG IMPLANT DT: 20130515
MDC IDC SESS DTM: 20200326144132
MDC IDC SET LEADCHNL RA PACING AMPLITUDE: 2 V
MDC IDC SET LEADCHNL RV PACING AMPLITUDE: 2.5 V
MDC IDC SET LEADCHNL RV PACING PULSEWIDTH: 0.4 ms
MDC IDC STAT BRADY AP VP PERCENT: 0 %

## 2018-09-29 ENCOUNTER — Encounter: Payer: Self-pay | Admitting: Cardiology

## 2018-09-29 NOTE — Progress Notes (Signed)
Remote pacemaker transmission.   

## 2018-10-12 DIAGNOSIS — Z7901 Long term (current) use of anticoagulants: Secondary | ICD-10-CM | POA: Diagnosis not present

## 2018-10-12 DIAGNOSIS — I4891 Unspecified atrial fibrillation: Secondary | ICD-10-CM | POA: Diagnosis not present

## 2018-10-12 DIAGNOSIS — I10 Essential (primary) hypertension: Secondary | ICD-10-CM | POA: Diagnosis not present

## 2018-10-12 DIAGNOSIS — M810 Age-related osteoporosis without current pathological fracture: Secondary | ICD-10-CM | POA: Diagnosis not present

## 2018-11-09 DIAGNOSIS — I10 Essential (primary) hypertension: Secondary | ICD-10-CM | POA: Diagnosis not present

## 2018-11-09 DIAGNOSIS — I4891 Unspecified atrial fibrillation: Secondary | ICD-10-CM | POA: Diagnosis not present

## 2018-11-09 DIAGNOSIS — Z7901 Long term (current) use of anticoagulants: Secondary | ICD-10-CM | POA: Diagnosis not present

## 2018-12-07 DIAGNOSIS — Z7901 Long term (current) use of anticoagulants: Secondary | ICD-10-CM | POA: Diagnosis not present

## 2018-12-07 DIAGNOSIS — I10 Essential (primary) hypertension: Secondary | ICD-10-CM | POA: Diagnosis not present

## 2018-12-07 DIAGNOSIS — I4891 Unspecified atrial fibrillation: Secondary | ICD-10-CM | POA: Diagnosis not present

## 2018-12-15 DIAGNOSIS — M810 Age-related osteoporosis without current pathological fracture: Secondary | ICD-10-CM | POA: Diagnosis not present

## 2018-12-17 DIAGNOSIS — Z1231 Encounter for screening mammogram for malignant neoplasm of breast: Secondary | ICD-10-CM | POA: Diagnosis not present

## 2018-12-24 ENCOUNTER — Ambulatory Visit (INDEPENDENT_AMBULATORY_CARE_PROVIDER_SITE_OTHER): Payer: Medicare Other | Admitting: *Deleted

## 2018-12-24 DIAGNOSIS — I441 Atrioventricular block, second degree: Secondary | ICD-10-CM

## 2018-12-24 LAB — CUP PACEART REMOTE DEVICE CHECK
Battery Impedance: 803 Ohm
Battery Remaining Longevity: 76 mo
Battery Voltage: 2.77 V
Brady Statistic AP VP Percent: 0 %
Brady Statistic AP VS Percent: 35 %
Brady Statistic AS VP Percent: 0 %
Brady Statistic AS VS Percent: 65 %
Date Time Interrogation Session: 20200625144234
Implantable Lead Implant Date: 20130515
Implantable Lead Implant Date: 20130515
Implantable Lead Location: 753859
Implantable Lead Location: 753860
Implantable Lead Model: 5076
Implantable Lead Model: 5076
Implantable Pulse Generator Implant Date: 20130515
Lead Channel Impedance Value: 442 Ohm
Lead Channel Impedance Value: 844 Ohm
Lead Channel Pacing Threshold Amplitude: 0.375 V
Lead Channel Pacing Threshold Amplitude: 0.875 V
Lead Channel Pacing Threshold Pulse Width: 0.4 ms
Lead Channel Pacing Threshold Pulse Width: 0.4 ms
Lead Channel Sensing Intrinsic Amplitude: 11.2 mV
Lead Channel Sensing Intrinsic Amplitude: 2.8 mV
Lead Channel Setting Pacing Amplitude: 2 V
Lead Channel Setting Pacing Amplitude: 2.5 V
Lead Channel Setting Pacing Pulse Width: 0.4 ms
Lead Channel Setting Sensing Sensitivity: 5.6 mV

## 2019-01-01 ENCOUNTER — Encounter: Payer: Self-pay | Admitting: Cardiology

## 2019-01-01 NOTE — Progress Notes (Signed)
Remote pacemaker transmission.   

## 2019-01-11 DIAGNOSIS — I4891 Unspecified atrial fibrillation: Secondary | ICD-10-CM | POA: Diagnosis not present

## 2019-01-11 DIAGNOSIS — Z7901 Long term (current) use of anticoagulants: Secondary | ICD-10-CM | POA: Diagnosis not present

## 2019-02-04 DIAGNOSIS — D1801 Hemangioma of skin and subcutaneous tissue: Secondary | ICD-10-CM | POA: Diagnosis not present

## 2019-02-04 DIAGNOSIS — L814 Other melanin hyperpigmentation: Secondary | ICD-10-CM | POA: Diagnosis not present

## 2019-02-04 DIAGNOSIS — D229 Melanocytic nevi, unspecified: Secondary | ICD-10-CM | POA: Diagnosis not present

## 2019-02-04 DIAGNOSIS — L309 Dermatitis, unspecified: Secondary | ICD-10-CM | POA: Diagnosis not present

## 2019-02-09 DIAGNOSIS — L821 Other seborrheic keratosis: Secondary | ICD-10-CM | POA: Diagnosis not present

## 2019-02-16 DIAGNOSIS — I10 Essential (primary) hypertension: Secondary | ICD-10-CM | POA: Diagnosis not present

## 2019-02-16 DIAGNOSIS — I4891 Unspecified atrial fibrillation: Secondary | ICD-10-CM | POA: Diagnosis not present

## 2019-02-16 DIAGNOSIS — Z7901 Long term (current) use of anticoagulants: Secondary | ICD-10-CM | POA: Diagnosis not present

## 2019-02-23 DIAGNOSIS — H401123 Primary open-angle glaucoma, left eye, severe stage: Secondary | ICD-10-CM | POA: Diagnosis not present

## 2019-02-23 DIAGNOSIS — H401112 Primary open-angle glaucoma, right eye, moderate stage: Secondary | ICD-10-CM | POA: Diagnosis not present

## 2019-03-23 DIAGNOSIS — I4891 Unspecified atrial fibrillation: Secondary | ICD-10-CM | POA: Diagnosis not present

## 2019-03-23 DIAGNOSIS — Z7901 Long term (current) use of anticoagulants: Secondary | ICD-10-CM | POA: Diagnosis not present

## 2019-03-23 DIAGNOSIS — I10 Essential (primary) hypertension: Secondary | ICD-10-CM | POA: Diagnosis not present

## 2019-03-25 ENCOUNTER — Ambulatory Visit (INDEPENDENT_AMBULATORY_CARE_PROVIDER_SITE_OTHER): Payer: Medicare Other | Admitting: *Deleted

## 2019-03-25 DIAGNOSIS — I441 Atrioventricular block, second degree: Secondary | ICD-10-CM

## 2019-03-25 LAB — CUP PACEART REMOTE DEVICE CHECK
Battery Impedance: 854 Ohm
Battery Remaining Longevity: 74 mo
Battery Voltage: 2.77 V
Brady Statistic AP VP Percent: 0 %
Brady Statistic AP VS Percent: 35 %
Brady Statistic AS VP Percent: 0 %
Brady Statistic AS VS Percent: 65 %
Date Time Interrogation Session: 20200924134634
Implantable Lead Implant Date: 20130515
Implantable Lead Implant Date: 20130515
Implantable Lead Location: 753859
Implantable Lead Location: 753860
Implantable Lead Model: 5076
Implantable Lead Model: 5076
Implantable Pulse Generator Implant Date: 20130515
Lead Channel Impedance Value: 424 Ohm
Lead Channel Impedance Value: 837 Ohm
Lead Channel Pacing Threshold Amplitude: 0.5 V
Lead Channel Pacing Threshold Amplitude: 0.875 V
Lead Channel Pacing Threshold Pulse Width: 0.4 ms
Lead Channel Pacing Threshold Pulse Width: 0.4 ms
Lead Channel Sensing Intrinsic Amplitude: 11.2 mV
Lead Channel Sensing Intrinsic Amplitude: 2.8 mV
Lead Channel Setting Pacing Amplitude: 2 V
Lead Channel Setting Pacing Amplitude: 2.5 V
Lead Channel Setting Pacing Pulse Width: 0.4 ms
Lead Channel Setting Sensing Sensitivity: 5.6 mV

## 2019-04-01 ENCOUNTER — Encounter: Payer: Self-pay | Admitting: Cardiology

## 2019-04-01 DIAGNOSIS — H401112 Primary open-angle glaucoma, right eye, moderate stage: Secondary | ICD-10-CM | POA: Diagnosis not present

## 2019-04-01 DIAGNOSIS — H401123 Primary open-angle glaucoma, left eye, severe stage: Secondary | ICD-10-CM | POA: Diagnosis not present

## 2019-04-01 NOTE — Progress Notes (Signed)
Remote pacemaker transmission.   

## 2019-04-23 DIAGNOSIS — H401133 Primary open-angle glaucoma, bilateral, severe stage: Secondary | ICD-10-CM | POA: Diagnosis not present

## 2019-04-27 DIAGNOSIS — Z23 Encounter for immunization: Secondary | ICD-10-CM | POA: Diagnosis not present

## 2019-04-27 DIAGNOSIS — I10 Essential (primary) hypertension: Secondary | ICD-10-CM | POA: Diagnosis not present

## 2019-04-27 DIAGNOSIS — I4891 Unspecified atrial fibrillation: Secondary | ICD-10-CM | POA: Diagnosis not present

## 2019-04-27 DIAGNOSIS — Z7901 Long term (current) use of anticoagulants: Secondary | ICD-10-CM | POA: Diagnosis not present

## 2019-05-03 ENCOUNTER — Telehealth: Payer: Self-pay | Admitting: Internal Medicine

## 2019-05-03 NOTE — Telephone Encounter (Signed)
Pt states she felt like she was in A-fib. The pt states she checked her pulse and it felt like it was going fast then it paused. The pt also states her blood pressure was all over the place Saturday and Sunday. I asked the pt to send in a transmission with her home monitor for the nurse to review it and give her a call back.

## 2019-05-03 NOTE — Telephone Encounter (Signed)
Transmission received. Presenting rhythm AS/VS @ 73bpm. <0.1% AT/AF burden since 08/14/18, 2 most recent episodes on 05/02/19--longest 9.44min, EGMs/markers suggest runs of 1:1 atrially-driven tachycardias. 174 VHR episodes since 2/14, available markers from 11/1 and 11/2 suggest brief runs of AT with P-waves in blanking (see examples below), though cannot completely exclude NSVT due to [facility] storage for most episodes, longest 56sec.   Spoke with patient. Explained findings. She has not had any symptomatic episodes so far today. Yesterday noticed palpitations, palpated pulse and realized it was fast. She could never "catch" an elevated HR on her automatic BP monitor, so she did not try PRN diltiazem. No dizziness, chest discomfort, or other associated symptoms. BPs today 106/66, 120/64, 123/72, 113/63. HRs 60s. Advised to only check BP once daily or when symptomatic. Educated about use of PRN diltiazem, pt agrees to try in the future for any episodes lasting more than a few minutes if her SBP is >100. Advised she will receive a call back if any new recommendations from Dr. Rayann Heman. Pt in agreement with plan, denies additional questions at this time.

## 2019-05-03 NOTE — Telephone Encounter (Signed)
Patient c/o Palpitations:  High priority if patient c/o lightheadedness, shortness of breath, or chest pain  1) How long have you had palpitations/irregular HR/ Afib? Are you having the symptoms now? She feels like she is experiencing Afib  2) Are you currently experiencing lightheadedness, SOB or CP? no  3) Do you have a history of afib (atrial fibrillation) or irregular heart rhythm?  yes  4) Have you checked your BP or HR? (document readings if available): HR around 60's  5) Are you experiencing any other symptoms? Patient is experiencing afib but feels like her pacemaker is not detecting it. Says the pacemaker beeps a couple of times then pauses.

## 2019-05-09 NOTE — Telephone Encounter (Signed)
Given advanced age, will manage conservatively No new recs

## 2019-05-18 ENCOUNTER — Telehealth: Payer: Self-pay

## 2019-05-18 NOTE — Telephone Encounter (Signed)
Pt states she was having A-flutter feelings. The pt wants to know if it safe to take her meds. I told her to send a manual transmission with her home monitor so the nurse to review.

## 2019-05-18 NOTE — Telephone Encounter (Signed)
Dr Rayann Heman given update on patient. If patient remote on 05/19/19 show AT/AF refer patient to AF clnic.

## 2019-05-18 NOTE — Telephone Encounter (Signed)
Transmission recieved

## 2019-05-18 NOTE — Telephone Encounter (Signed)
Patient reports she woke up around 4:00 am and felt her heart rate was elevated. She took her BP and it was 111/76 and her heart rate was in the 120s. She took ditiazem 30 mg at 4:30 am per DR Allred's prn order and went back to sleep. She woke up an hour later and was dizzy when she got up to go to the bathroom. She went back to sleep and since waking up the second time has had intermittent chest pressure and her heart rate continues to be in the 120's. She sent a remote transmission which shows that at 3:36 am she was in AF  For 2 hrs and 48 minutes with a max v-rate of 240 and ave v-rate of 146. She had 2 more episodes of AT/AF from 0830-0831 am  And at 9:08 am she went into AFL for 2 hrs and 54 minutes. She currently in in AT @ 122 bpm. She took her BP and it was 128/86 and her heart rate 122 bpm. I advised her to take diltiazem 30 mg as ordered prn by Dr Rayann Heman and to retake her BP and pulse in an hour and to change positions slowly to avoid orthostatic hypotension. Patient to send remote transmission 05/19/19 in am to assess cardiac status. ED precautions given for CP, SOB,syncope.

## 2019-05-19 NOTE — Telephone Encounter (Signed)
Spoke with patient. She reports her heart rate has been up and down throughout the day, ranging from 50s-120s when she checks it. Able to take PRN diltiazem a couple of times since yesterday and feels this has helped some. Transmission from 11/18 at 07:44 reveals pt is in 2:1 A-flutter, V rate 130bpm.   Pt is agreeable to an AF Clinic appointment tomorrow. She has previously seen D. Kayleen Memos, NP (in 2018). AF Clinic parking instructions provided. Appointment scheduled for 05/20/19 at 15:30. ED precautions reviewed. Pt verbalizes understanding and denies additional questions at this time.

## 2019-05-19 NOTE — Telephone Encounter (Signed)
Transmission received.

## 2019-05-20 ENCOUNTER — Other Ambulatory Visit: Payer: Self-pay

## 2019-05-20 ENCOUNTER — Encounter (HOSPITAL_COMMUNITY): Payer: Self-pay | Admitting: Nurse Practitioner

## 2019-05-20 ENCOUNTER — Ambulatory Visit (HOSPITAL_COMMUNITY)
Admission: RE | Admit: 2019-05-20 | Discharge: 2019-05-20 | Disposition: A | Discharge status: Home / Self Care | Payer: Medicare Other | Source: Ambulatory Visit | Attending: Nurse Practitioner | Admitting: Nurse Practitioner

## 2019-05-20 VITALS — BP 140/84 | HR 131 | Ht 64.5 in | Wt 105.4 lb

## 2019-05-20 DIAGNOSIS — Z7901 Long term (current) use of anticoagulants: Secondary | ICD-10-CM | POA: Diagnosis not present

## 2019-05-20 DIAGNOSIS — K219 Gastro-esophageal reflux disease without esophagitis: Secondary | ICD-10-CM | POA: Insufficient documentation

## 2019-05-20 DIAGNOSIS — I10 Essential (primary) hypertension: Secondary | ICD-10-CM | POA: Diagnosis not present

## 2019-05-20 DIAGNOSIS — I4892 Unspecified atrial flutter: Secondary | ICD-10-CM | POA: Diagnosis not present

## 2019-05-20 DIAGNOSIS — R9431 Abnormal electrocardiogram [ECG] [EKG]: Secondary | ICD-10-CM | POA: Insufficient documentation

## 2019-05-20 DIAGNOSIS — Z888 Allergy status to other drugs, medicaments and biological substances status: Secondary | ICD-10-CM | POA: Diagnosis not present

## 2019-05-20 DIAGNOSIS — Z809 Family history of malignant neoplasm, unspecified: Secondary | ICD-10-CM | POA: Diagnosis not present

## 2019-05-20 DIAGNOSIS — I2510 Atherosclerotic heart disease of native coronary artery without angina pectoris: Secondary | ICD-10-CM | POA: Diagnosis not present

## 2019-05-20 DIAGNOSIS — E785 Hyperlipidemia, unspecified: Secondary | ICD-10-CM | POA: Insufficient documentation

## 2019-05-20 DIAGNOSIS — I441 Atrioventricular block, second degree: Secondary | ICD-10-CM | POA: Insufficient documentation

## 2019-05-20 DIAGNOSIS — D6869 Other thrombophilia: Secondary | ICD-10-CM

## 2019-05-20 DIAGNOSIS — Z950 Presence of cardiac pacemaker: Secondary | ICD-10-CM | POA: Insufficient documentation

## 2019-05-20 DIAGNOSIS — H409 Unspecified glaucoma: Secondary | ICD-10-CM | POA: Diagnosis not present

## 2019-05-20 DIAGNOSIS — Z79899 Other long term (current) drug therapy: Secondary | ICD-10-CM | POA: Insufficient documentation

## 2019-05-20 DIAGNOSIS — I480 Paroxysmal atrial fibrillation: Secondary | ICD-10-CM | POA: Diagnosis not present

## 2019-05-20 DIAGNOSIS — Z882 Allergy status to sulfonamides status: Secondary | ICD-10-CM | POA: Diagnosis not present

## 2019-05-20 DIAGNOSIS — Z8249 Family history of ischemic heart disease and other diseases of the circulatory system: Secondary | ICD-10-CM | POA: Diagnosis not present

## 2019-05-20 DIAGNOSIS — Z9861 Coronary angioplasty status: Secondary | ICD-10-CM | POA: Insufficient documentation

## 2019-05-20 DIAGNOSIS — I447 Left bundle-branch block, unspecified: Secondary | ICD-10-CM | POA: Diagnosis not present

## 2019-05-20 MED ORDER — DILTIAZEM HCL ER COATED BEADS 120 MG PO CP24: 120.0000 mg | ORAL_CAPSULE | Freq: Every day | ORAL | 3 refills | Status: DC

## 2019-05-20 NOTE — Progress Notes (Signed)
Primary Care Physician: [name], MD Referring Physician: device clinic Cardiologist: Rhonda [name] is a 85 y.o. female with a h/o paroxysmal afib/flutter, PPM with second degree Mobitz II AV block, CAD, HTN that is in the afib clinic for device cliinic seeing persistent flutter since 11/17. Husband did note that her HR yesterday and  stayed around 120-130 bpm. She feels tired but otherwise feeling ok. She was noted to have a lot of interment atrial flutter the first of the month but Dr. Allredrecommeded conservative treatment/ no other recommendations 2/2 advanced age. She has been taking 30 mg Cardizem at home without any improvement.  Today, she denies symptoms of palpitations, chest pain, shortness of breath, orthopnea, PND, lower extremity edema, dizziness, presyncope, syncope, or neurologic sequela. The patient is tolerating medications without difficulties and is otherwise without complaint today.   Past Medical History:  Diagnosis Date  . Anemia   . Bilateral carotid bruits   . Coronary disease    s/p PTCA 1990s  . GERD (gastroesophageal reflux disease)    takes Protonix daily  . GI bleed    previously with pradaxa  . Glaucoma    uses Eye Drops daily  . H/O hiatal hernia   . Hyperlipidemia    takes Atorvastatin daily  . Hypertension    takes Azor and Apresoline daily  . LBBB (left bundle branch block)   . Pacemaker    MDT Advisa May 2013, Dr. Salena Saner  . PAF (paroxysmal atrial fibrillation) [facility])    PVI ablation May 2013, takes Coumadin daily  . Sore throat    since anesthesia in Feb 2015 AND ALLEGERIES   Past Surgical History:  Procedure Laterality Date  . atrial fibrillation ablation  11/12/11   PVI by Dr Johney Frame  . ATRIAL FIBRILLATION ABLATION N/A 11/12/2011   Procedure: ATRIAL FIBRILLATION ABLATION;  Surgeon: [name], MD;  Location: [facility] CATH LAB;  Service: Cardiovascular;  Laterality: N/A;  . CATARACT EXTRACTION W/ INTRAOCULAR LENS  IMPLANT,  BILATERAL    . COLONOSCOPY    . CORONARY ANGIOPLASTY  1990s   by Dr Aleen Campi  . ESOPHAGOGASTRECTOMY    . EYE SURGERY     GLAUCOMA SURG LT EYE  . KYPHOPLASTY N/A 08/09/2013   Procedure: LUMBAR TWO KYPHOPLASTY;  Surgeon: Barnett Abu, MD;  Location: [facility] NEURO ORS;  Service: Neurosurgery;  Laterality: N/A;  L2 Kyphoplasty  . KYPHOPLASTY N/A 09/07/2013   Procedure: L1 KYPHOPLASTY;  Surgeon: Barnett Abu, MD;  Location: [facility] NEURO ORS;  Service: Neurosurgery;  Laterality: N/A;  . KYPHOPLASTY N/A 10/04/2013   Procedure: T12 KYPHOPLASTY;  Surgeon: Barnett Abu, MD;  Location: [facility] NEURO ORS;  Service: Neurosurgery;  Laterality: N/A;  T12 KYPHOPLASTY  . PACEMAKER INSERTION  11/13/11   MDT implanted by Dr Royann Shivers for 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 observed on EP study  . PERMANENT PACEMAKER INSERTION N/A 11/13/2011   Procedure: PERMANENT PACEMAKER INSERTION;  Surgeon: Thurmon Fair, MD;  Location: [facility] CATH LAB;  Service: Cardiovascular;  Laterality: N/A;  . TEE WITHOUT CARDIOVERSION  11/11/2011   Procedure: TRANSESOPHAGEAL ECHOCARDIOGRAM (TEE);  Surgeon: Chrystie Nose, MD;  Location: [facility] ENDOSCOPY;  Service: Cardiovascular;  Laterality: N/A;  . TEMPORARY PACEMAKER INSERTION  11/12/2011   Procedure: TEMPORARY PACEMAKER INSERTION;  Surgeon: [name], MD;  Location: [facility] CATH LAB;  Service: Cardiovascular;;    Current Outpatient Medications  Medication Sig Dispense Refill  . atorvastatin (LIPITOR) 40 MG tablet Take by mouth daily.  2  . brimonidine (ALPHAGAN P) 0.15 % ophthalmic solution Place 1 drop into the right eye 3 (three) times daily.     . Cholecalciferol (VITAMIN D3) 1000 UNITS CAPS Take 2,000 Units by mouth daily.     Marland Kitchen denosumab (PROLIA) 60 MG/ML SOLN injection Inject 60 mg into the skin every 6 (six) months. Administer in upper arm, thigh, or abdomen    . diltiazem (CARDIZEM) 30 MG tablet take 1 tablet every 4 hours AS NEEDED for heart rate >100 as long as blood pressure >100. 45 tablet  3  . dorzolamide (TRUSOPT) 2 % ophthalmic solution Place 1 drop into the right eye 3 (three) times daily.     . furosemide (LASIX) 40 MG tablet Take 1.5 tablets (60 mg total) by mouth daily. 135 tablet 3  . Latanoprostene Bunod (VYZULTA) 0.024 % SOLN Apply 1 drop to eye at bedtime. Instill 1 drop in right eye every evening at bedtime.    . nitroGLYCERIN (NITROSTAT) 0.4 MG SL tablet Place 0.4 mg under the tongue every 5 (five) minutes as needed. For chest pain.    Marland Kitchen PAZEO 0.7 % SOLN Use as directed  5  . warfarin (COUMADIN) 5 MG tablet Take 2.5-5 mg by mouth See admin instructions.     Marland Kitchen diltiazem (CARDIZEM CD) 120 MG 24 hr capsule Take 1 capsule (120 mg total) by mouth daily. 30 capsule 3   No current facility-administered medications for this encounter.     Allergies  Allergen Reactions  . Azor [Amlodipine-Olmesartan] Other (See Comments)    Higher doses cause ankles to swell  . Beta Adrenergic Blockers Other (See Comments)    Bradycardia.  . Norvasc [Amlodipine Besylate] Other (See Comments)    edema  . Pradaxa [Dabigatran Etexilate Mesylate] Other (See Comments)    GI Bleed  . Questran [Cholestyramine] Other (See Comments)    Constipation  . Sulfa Drugs Cross Reactors Hives    Social History   Socioeconomic History  . Marital status: Married    Spouse name: Not on file  . Number of children: Not on file  . Years of education: Not on file  . Highest education level: Not on file  Occupational History  . Not on file  Social Needs  . Financial resource strain: Not on file  . Food insecurity    Worry: Not on file    Inability: Not on file  . Transportation needs    Medical: Not on file    Non-medical: Not on file  Tobacco Use  . Smoking status: Never Smoker  . Smokeless tobacco: Never Used  Substance and Sexual Activity  . Alcohol use: No  . Drug use: No  . Sexual activity: Not Currently    Birth control/protection: Post-menopausal  Lifestyle  . Physical activity     Days per week: Not on file    Minutes per session: Not on file  . Stress: Not on file  Relationships  . Social Herbalist on phone: Not on file    Gets together: Not on file    Attends religious service: Not on file    Active member of club or organization: Not on file    Attends meetings of clubs or organizations: Not on file    Relationship status: Not on file  . Intimate partner violence    Fear of current or ex partner: Not on file    Emotionally abused: Not on file    Physically abused: Not on  file    Forced sexual activity: Not on file  Other Topics Concern  . Not on file  Social History Narrative   [place] lives in [place].  Retired form [employer].   Attends [employer]    Family History  Problem Relation Age of Onset  . Heart disease Mother   . Heart disease Father   . Heart disease Brother   . Cancer Maternal Grandmother   . Heart disease Maternal Grandfather   . Pneumonia Paternal Grandmother   . Pneumonia Paternal Grandfather   . Heart disease Brother   . Heart disease Brother   . Pneumonia Sister     ROS- All systems are reviewed and negative except as per the HPI above  Physical Exam: Vitals:   05/20/19 1527  BP: 140/84  Pulse: (!) 131  Weight: 47.8 kg  Height: 5' 4.5" (1.638 m)   Wt Readings from Last 3 Encounters:  05/20/19 47.8 kg  08/14/18 45.8 kg  06/30/17 49.4 kg    Labs: Lab Results  Component Value Date   NA 141 10/04/2013   K 4.2 10/04/2013   CL 102 10/04/2013   CO2 25 10/04/2013   GLUCOSE 87 10/04/2013   BUN 9 10/04/2013   CREATININE 0.46 (L) 10/04/2013   CALCIUM 10.0 10/04/2013   MG 2.2 09/13/2010   Lab Results  Component Value Date   INR 1.19 10/04/2013   Lab Results  Component Value Date   CHOL  04/30/2010    149        ATP III CLASSIFICATION:  <200     mg/dL   Desirable  [id number]  mg/dL   Borderline High  >=478>=240    mg/dL   High          HDL 71 04/30/2010   LDLCALC  04/30/2010    70         Total Cholesterol/HDL:CHD Risk Coronary Heart Disease Risk Table                     Men   Women  1/2 Average Risk   3.4   3.3  Average Risk       5.0   4.4  2 X Average Risk   9.6   7.1  3 X Average Risk  23.4   11.0        Use the calculated Patient Ratio above and the CHD Risk Table to determine the patient's CHD Risk.        ATP III CLASSIFICATION (LDL):  <100     mg/dL   Optimal  [id number]  mg/dL   Near or Above                    Optimal  130-159  mg/dL   Borderline  308-657160-189  mg/dL   High  >846>190     mg/dL   Very High   TRIG 42 96/29/528410/31/2011     GEN- The patient is well appearing, alert and oriented x 3 today.   Head- normocephalic, atraumatic Eyes-  Sclera clear, conjunctiva pink Ears- hearing intact Oropharynx- clear Neck- supple, no JVP Lymph- no cervical lymphadenopathy Lungs- Clear to ausculation bilaterally, normal work of breathing Heart- Rapid regular rate and rhythm, no murmurs, rubs or gallops, PMI not laterally displaced GI- soft, NT, ND, + BS Extremities- no clubbing, cyanosis, or edema MS- no significant deformity or atrophy Skin- no rash or lesion Psych- euthymic mood, full affect Neuro- strength and sensation are  intact  EKG-probable atrial flutter at 130 bpm, qrs int 126 ms, qtc 525 ms   Assessment and Plan: 1. Atrial flutter  Persistent  since 11/17 Stop amlodipine and will start 120 mg Cardizem daily She could still use 30 mg Cardizem if needed  To ER over the weekend if symptoms worsen If this does not restore SR will plan on cardiversion  2. CHA2DS2VASc score  of at least 4 Continue  warfarin If DCCV indicated will probably need TEE guided   See back on Monday   Rhonda [name] C. Matthew [facility] [address] [place], [place] [address] [phone]

## 2019-05-20 NOTE — Patient Instructions (Signed)
Stop amlodipine  Start Cardizem 120mg once a day 

## 2019-05-21 ENCOUNTER — Other Ambulatory Visit (HOSPITAL_COMMUNITY): Payer: Self-pay | Admitting: *Deleted

## 2019-05-21 DIAGNOSIS — I4892 Unspecified atrial flutter: Secondary | ICD-10-CM

## 2019-05-22 ENCOUNTER — Other Ambulatory Visit (HOSPITAL_COMMUNITY)
Admission: RE | Admit: 2019-05-22 | Discharge: 2019-05-22 | Disposition: A | Discharge status: Home / Self Care | Payer: Medicare Other | Source: Ambulatory Visit | Attending: Cardiovascular Disease | Admitting: Cardiovascular Disease

## 2019-05-22 DIAGNOSIS — Z01812 Encounter for preprocedural laboratory examination: Secondary | ICD-10-CM | POA: Diagnosis not present

## 2019-05-22 DIAGNOSIS — Z20828 Contact with and (suspected) exposure to other viral communicable diseases: Secondary | ICD-10-CM | POA: Diagnosis not present

## 2019-05-24 ENCOUNTER — Other Ambulatory Visit: Payer: Self-pay

## 2019-05-24 ENCOUNTER — Ambulatory Visit (HOSPITAL_COMMUNITY)
Admission: RE | Admit: 2019-05-24 | Discharge: 2019-05-24 | Disposition: A | Discharge status: Home / Self Care | Payer: Medicare Other | Source: Ambulatory Visit | Attending: Nurse Practitioner | Admitting: Nurse Practitioner

## 2019-05-24 VITALS — BP 124/80 | HR 139

## 2019-05-24 DIAGNOSIS — Z7901 Long term (current) use of anticoagulants: Secondary | ICD-10-CM | POA: Insufficient documentation

## 2019-05-24 DIAGNOSIS — I4891 Unspecified atrial fibrillation: Secondary | ICD-10-CM | POA: Insufficient documentation

## 2019-05-24 DIAGNOSIS — Z79899 Other long term (current) drug therapy: Secondary | ICD-10-CM | POA: Insufficient documentation

## 2019-05-24 DIAGNOSIS — I4892 Unspecified atrial flutter: Secondary | ICD-10-CM | POA: Diagnosis not present

## 2019-05-24 LAB — CBC
HCT: 38.8 % (ref 36.0–46.0)
Hemoglobin: 12.9 g/dL (ref 12.0–15.0)
MCH: 30.8 pg (ref 26.0–34.0)
MCHC: 33.2 g/dL (ref 30.0–36.0)
MCV: 92.6 fL (ref 80.0–100.0)
Platelets: 200 10*3/uL (ref 150–400)
RBC: 4.19 MIL/uL (ref 3.87–5.11)
RDW: 12.9 % (ref 11.5–15.5)
WBC: 5.3 10*3/uL (ref 4.0–10.5)
nRBC: 0 % (ref 0.0–0.2)

## 2019-05-24 LAB — BASIC METABOLIC PANEL
Anion gap: 6 (ref 5–15)
BUN: 19 mg/dL (ref 8–23)
CO2: 27 mmol/L (ref 22–32)
Calcium: 10.1 mg/dL (ref 8.9–10.3)
Chloride: 106 mmol/L (ref 98–111)
Creatinine, Ser: 0.61 mg/dL (ref 0.44–1.00)
GFR calc Af Amer: >60 mL/min (ref >60)
GFR calc non Af Amer: >60 mL/min (ref >60)
Glucose, Bld: 103 mg/dL — ABNORMAL HIGH (ref 70–99)
Potassium: 4.2 mmol/L (ref 3.5–5.1)
Sodium: 139 mmol/L (ref 135–145)

## 2019-05-24 LAB — NOVEL CORONAVIRUS, NAA (HOSP ORDER, SEND-OUT TO REF LAB; TAT 18-24 HRS): SARS-CoV-2, NAA: NOT DETECTED

## 2019-05-24 LAB — PROTIME-INR
INR: 2.6 — ABNORMAL HIGH (ref 0.8–1.2)
Prothrombin Time: 27.4 seconds — ABNORMAL HIGH (ref 11.4–15.2)

## 2019-05-24 NOTE — H&P (View-Only) (Signed)
Rhonda [name] in in for EKG after starting on diltiazem 120 mg daily.It  really is not controlling her atrial flutter  and continues in this at 139 bpm. Other than being tired, she seems to be tolerating. She is scheduled for a TEE guided  Cardioversion this Wednesday. She is on warfarin and do not think she will tolerate this rate for the 4 weekly INR's that are required  before DCCV.   Covid test done, results pending.cbd/bmet/inr done today. Risks vrs benefit of procedure discussed with pt and husband.  

## 2019-05-24 NOTE — Progress Notes (Signed)
Rhonda [name] in in for EKG after starting on diltiazem 120 mg daily.It  really is not controlling her atrial flutter  and continues in this at 139 bpm. Other than being tired, she seems to be tolerating. She is scheduled for a TEE guided  Cardioversion this Wednesday. She is on warfarin and do not think she will tolerate this rate for the 4 weekly INR's that are required  before DCCV.   Covid test done, results pending.cbd/bmet/inr done today. Risks vrs benefit of procedure discussed with pt and husband.

## 2019-05-24 NOTE — Patient Instructions (Signed)
.  cardioversion is scheduled for 05/26/2019  -Arrive at the [place] tower entrance and go to admitting at 9:30am, need INR prior to admission  -Do not eat or drink after midnight the night prior to your procedure -Take all your morning medications with a sip of water prior to arrival You must have a ride present at discharge

## 2019-05-25 NOTE — Progress Notes (Signed)
Attempted pre op call for procedure tomorrow 11/25, no answer, did not leave VM due to no VM set up.

## 2019-05-26 ENCOUNTER — Encounter (HOSPITAL_COMMUNITY)
Admission: RE | Disposition: A | Discharge status: Home / Self Care | Payer: Medicare Other | Source: Home / Self Care | Attending: Cardiovascular Disease

## 2019-05-26 ENCOUNTER — Other Ambulatory Visit: Payer: Self-pay

## 2019-05-26 ENCOUNTER — Ambulatory Visit (HOSPITAL_COMMUNITY): Payer: Medicare Other | Admitting: Anesthesiology

## 2019-05-26 ENCOUNTER — Ambulatory Visit (HOSPITAL_COMMUNITY)
Admission: RE | Admit: 2019-05-26 | Discharge: 2019-05-26 | Disposition: A | Discharge status: Home / Self Care | Payer: Medicare Other | Attending: Cardiovascular Disease | Admitting: Cardiovascular Disease

## 2019-05-26 ENCOUNTER — Encounter (HOSPITAL_COMMUNITY): Payer: Self-pay | Admitting: Cardiovascular Disease

## 2019-05-26 ENCOUNTER — Ambulatory Visit (HOSPITAL_BASED_OUTPATIENT_CLINIC_OR_DEPARTMENT_OTHER): Payer: Medicare Other

## 2019-05-26 DIAGNOSIS — I4892 Unspecified atrial flutter: Secondary | ICD-10-CM

## 2019-05-26 DIAGNOSIS — I4891 Unspecified atrial fibrillation: Secondary | ICD-10-CM | POA: Diagnosis not present

## 2019-05-26 DIAGNOSIS — I361 Nonrheumatic tricuspid (valve) insufficiency: Secondary | ICD-10-CM

## 2019-05-26 DIAGNOSIS — Z955 Presence of coronary angioplasty implant and graft: Secondary | ICD-10-CM | POA: Insufficient documentation

## 2019-05-26 DIAGNOSIS — I071 Rheumatic tricuspid insufficiency: Secondary | ICD-10-CM | POA: Insufficient documentation

## 2019-05-26 DIAGNOSIS — I420 Dilated cardiomyopathy: Secondary | ICD-10-CM | POA: Diagnosis not present

## 2019-05-26 DIAGNOSIS — Z882 Allergy status to sulfonamides status: Secondary | ICD-10-CM | POA: Diagnosis not present

## 2019-05-26 DIAGNOSIS — I2510 Atherosclerotic heart disease of native coronary artery without angina pectoris: Secondary | ICD-10-CM | POA: Diagnosis not present

## 2019-05-26 DIAGNOSIS — Z8249 Family history of ischemic heart disease and other diseases of the circulatory system: Secondary | ICD-10-CM | POA: Insufficient documentation

## 2019-05-26 DIAGNOSIS — K219 Gastro-esophageal reflux disease without esophagitis: Secondary | ICD-10-CM | POA: Diagnosis not present

## 2019-05-26 DIAGNOSIS — Z7901 Long term (current) use of anticoagulants: Secondary | ICD-10-CM | POA: Diagnosis not present

## 2019-05-26 DIAGNOSIS — Z888 Allergy status to other drugs, medicaments and biological substances status: Secondary | ICD-10-CM | POA: Diagnosis not present

## 2019-05-26 DIAGNOSIS — H409 Unspecified glaucoma: Secondary | ICD-10-CM | POA: Diagnosis not present

## 2019-05-26 DIAGNOSIS — I447 Left bundle-branch block, unspecified: Secondary | ICD-10-CM | POA: Insufficient documentation

## 2019-05-26 DIAGNOSIS — E785 Hyperlipidemia, unspecified: Secondary | ICD-10-CM | POA: Insufficient documentation

## 2019-05-26 DIAGNOSIS — I10 Essential (primary) hypertension: Secondary | ICD-10-CM | POA: Diagnosis not present

## 2019-05-26 DIAGNOSIS — Z79899 Other long term (current) drug therapy: Secondary | ICD-10-CM | POA: Diagnosis not present

## 2019-05-26 HISTORY — PX: TEE WITHOUT CARDIOVERSION: SHX5443

## 2019-05-26 HISTORY — PX: CARDIOVERSION: SHX1299

## 2019-05-26 SURGERY — ECHOCARDIOGRAM, TRANSESOPHAGEAL
Anesthesia: Monitor Anesthesia Care

## 2019-05-26 MED ORDER — PROPOFOL 500 MG/50ML IV EMUL
INTRAVENOUS | Status: DC | PRN
  Administered 2019-05-26: 50 ug/kg/min via INTRAVENOUS

## 2019-05-26 MED ORDER — SODIUM CHLORIDE 0.9 % IV SOLN
INTRAVENOUS | Status: DC
  Administered 2019-05-26 (×2): via INTRAVENOUS

## 2019-05-26 MED ORDER — LIDOCAINE 2% (20 MG/ML) 5 ML SYRINGE
INTRAMUSCULAR | Status: DC | PRN
  Administered 2019-05-26: 60 mg via INTRAVENOUS

## 2019-05-26 MED ORDER — PROPOFOL 10 MG/ML IV BOLUS
INTRAVENOUS | Status: DC | PRN
  Administered 2019-05-26: 30 mg via INTRAVENOUS

## 2019-05-26 NOTE — Anesthesia Preprocedure Evaluation (Addendum)
Anesthesia Evaluation  Patient identified by MRN, date of birth, ID band Patient awake    Reviewed: Allergy & Precautions, NPO status , Patient's Chart, lab work & pertinent test results  History of Anesthesia Complications Negative for: history of anesthetic complications  Airway Mallampati: II  TM Distance: >3 FB Neck ROM: Full    Dental  (+) Upper Dentures   Pulmonary neg pulmonary ROS,    Pulmonary exam normal        Cardiovascular hypertension, + CAD and + Cardiac Stents  + dysrhythmias Atrial Fibrillation + pacemaker  Rhythm:Irregular     Neuro/Psych negative neurological ROS     GI/Hepatic Neg liver ROS, hiatal hernia, GERD  ,  Endo/Other  negative endocrine ROS  Renal/GU negative Renal ROS     Musculoskeletal negative musculoskeletal ROS (+)   Abdominal   Peds  Hematology negative hematology ROS (+)   Anesthesia Other Findings Day of surgery medications reviewed with the patient.  Reproductive/Obstetrics                            Anesthesia Physical Anesthesia Plan  ASA: III  Anesthesia Plan: MAC   Post-op Pain Management:    Induction:   PONV Risk Score and Plan: Ondansetron and Propofol infusion  Airway Management Planned: Simple Face Mask  Additional Equipment:   Intra-op Plan:   Post-operative Plan:   Informed Consent: I have reviewed the patients History and Physical, chart, labs and discussed the procedure including the risks, benefits and alternatives for the proposed anesthesia with the patient or authorized representative who has indicated his/her understanding and acceptance.     Dental advisory given  Plan Discussed with: CRNA and Anesthesiologist  Anesthesia Plan Comments:        Anesthesia Quick Evaluation

## 2019-05-26 NOTE — Anesthesia Postprocedure Evaluation (Signed)
Anesthesia Post Note  Patient: Rhonda [name]  Procedure(s) Performed: TRANSESOPHAGEAL ECHOCARDIOGRAM (TEE) (N/A ) CARDIOVERSION (N/A )     Patient location during evaluation: PACU Anesthesia Type: MAC Level of consciousness: awake and alert Pain management: pain level controlled Vital Signs Assessment: post-procedure vital signs reviewed and stable Respiratory status: spontaneous breathing and respiratory function stable Cardiovascular status: stable Postop Assessment: no apparent nausea or vomiting Anesthetic complications: no    Last Vitals:  Vitals:   05/26/19 1121 05/26/19 1131  BP: (!) 144/55 116/68  Pulse: (!) 59 65  Resp: 18 19  Temp:    SpO2: 99% 99%    Last Pain:  Vitals:   05/26/19 1131  TempSrc:   PainSc: 0-No pain                 Malicia Blasdel DANIEL

## 2019-05-26 NOTE — Progress Notes (Signed)
  Echocardiogram Echocardiogram Transesophageal has been performed.  Rhonda [name] 05/26/2019, 10:48 AM

## 2019-05-26 NOTE — Discharge Instructions (Signed)
TEE ° °YOU HAD AN CARDIAC PROCEDURE TODAY: Refer to the procedure report and other information in the discharge instructions given to you for any specific questions about what was found during the examination. If this information does not answer your questions, please call [facility] office at [phone] to clarify.  ° °DIET: Your first meal following the procedure should be a light meal and then it is ok to progress to your normal diet. A half-sandwich or bowl of soup is an example of a good first meal. Heavy or fried foods are harder to digest and may make you feel nauseous or bloated. Drink plenty of fluids but you should avoid alcoholic beverages for 24 hours. If you had a esophageal dilation, please see attached instructions for diet.  ° °ACTIVITY: Your care partner should take you home directly after the procedure. You should plan to take it easy, moving slowly for the rest of the day. You can resume normal activity the day after the procedure however YOU SHOULD NOT DRIVE, use power tools, machinery or perform tasks that involve climbing or major physical exertion for 24 hours (because of the sedation medicines used during the test).  ° °SYMPTOMS TO REPORT IMMEDIATELY: °A cardiologist can be reached at any hour. Please call [phone] for any of the following symptoms:  °Vomiting of blood or coffee ground material  °New, significant abdominal pain  °New, significant chest pain or pain under the shoulder blades  °Painful or persistently difficult swallowing  °New shortness of breath  °Black, tarry-looking or red, bloody stools ° °FOLLOW UP:  °Please also call with any specific questions about appointments or follow up tests. ° ° ° °Electrical Cardioversion, Care After °This sheet gives you information about how to care for yourself after your procedure. Your health care provider may also give you more specific instructions. If you have problems or questions, contact your health care provider. °What can  I expect after the procedure? °After the procedure, it is common to have: °· Some redness on the skin where the shocks were given. °Follow these instructions at home: ° °· Do not drive for 24 hours if you were given a medicine to help you relax (sedative). °· Take over-the-counter and prescription medicines only as told by your health care provider. °· Ask your health care provider how to check your pulse. Check it often. °· Rest for 48 hours after the procedure or as told by your health care provider. °· Avoid or limit your caffeine use as told by your health care provider. °Contact a health care provider if: °· You feel like your heart is beating too quickly or your pulse is not regular. °· You have a serious muscle cramp that does not go away. °Get help right away if: ° °· You have discomfort in your chest. °· You are dizzy or you feel faint. °· You have trouble breathing or you are short of breath. °· Your speech is slurred. °· You have trouble moving an arm or leg on one side of your body. °· Your fingers or toes turn cold or blue. °This information is not intended to replace advice given to you by your health care provider. Make sure you discuss any questions you have with your health care provider. °Document Released: 04/07/2013 Document Revised: 05/30/2017 Document Reviewed: 12/22/2015 °Elsevier Patient Education © 2020 [employer] Inc[employer] ° °

## 2019-05-26 NOTE — Interval H&P Note (Signed)
History and Physical Interval Note:  05/26/2019 9:54 AM  Arma Heading  has presented today for surgery, with the diagnosis of A-FIB.  The various methods of treatment have been discussed with the patient and family. After consideration of risks, benefits and other options for treatment, the patient has consented to  Procedure(s): TRANSESOPHAGEAL ECHOCARDIOGRAM (TEE) (N/A) CARDIOVERSION (N/A) as a surgical intervention.  The patient's history has been reviewed, patient examined, no change in status, stable for surgery.  I have reviewed the patient's chart and labs.  Questions were answered to the patient's satisfaction.     Skeet Latch, MD

## 2019-05-26 NOTE — CV Procedure (Signed)
Brief TEE Note  LVEF 40-45% Global hypokinesis Mild TR Trivial MR Aortic valve leaflets moderately calcified and thickened.  Partial fusion of the left and non coronary cusps.  At least mild aortic stenosis. No LA/LAA thrombus or mass.  For additional details see full report.  Electrical Cardioversion Procedure Note Rhonda [name] [id number] 20-Oct-1929  Procedure: Electrical Cardioversion Indications:  Atrial Fibrillation  Procedure Details Consent: Risks of procedure as well as the alternatives and risks of each were explained to the (patient/caregiver).  Consent for procedure obtained. Time Out: Verified patient identification, verified procedure, site/side was marked, verified correct patient position, special equipment/implants available, medications/allergies/relevent history reviewed, required imaging and test results available.  Performed  Patient placed on cardiac monitor, pulse oximetry, supplemental oxygen as necessary.  Sedation given: propofol Pacer pads placed anterior and posterior chest.  Cardioverted 1 time(s).  Cardioverted at 120J.  Evaluation Findings: Post procedure EKG shows: APVS with salvos of atrial fibrillation Complications: None Patient did tolerate procedure well.   Rhonda Latch, MD 05/26/2019, 10:46 AM

## 2019-05-26 NOTE — Transfer of Care (Signed)
Immediate Anesthesia Transfer of Care Note  Patient: Rhonda [name]  Procedure(s) Performed: TRANSESOPHAGEAL ECHOCARDIOGRAM (TEE) (N/A ) CARDIOVERSION (N/A )  Patient Location: PACU Endo  Anesthesia Type:MAC  Level of Consciousness: drowsy and patient cooperative  Airway & Oxygen Therapy: Patient Spontanous Breathing and Patient connected to nasal cannula oxygen  Post-op Assessment: Report given to RN and Post -op Vital signs reviewed and stable  Post vital signs: Reviewed and stable  Last Vitals:  Vitals Value Taken Time  BP 91/50 05/26/19 1052  Temp    Pulse 57 05/26/19 1056  Resp 22 05/26/19 1056  SpO2 97 % 05/26/19 1056  Vitals shown include unvalidated device data.  Last Pain:  Vitals:   05/26/19 1052  TempSrc:   PainSc: 0-No pain         Complications: No apparent anesthesia complications

## 2019-05-27 ENCOUNTER — Encounter (HOSPITAL_COMMUNITY): Payer: Self-pay | Admitting: Cardiovascular Disease

## 2019-05-31 ENCOUNTER — Telehealth (HOSPITAL_COMMUNITY): Payer: Self-pay | Admitting: *Deleted

## 2019-05-31 NOTE — Telephone Encounter (Signed)
Patient called in stating since cardioversion 11/25 she is having increased lower extremity swelling. Discussed with Roderic Palau NP -- will increase lasix to 80mg  a day for 3 days then reduce back to normal daily dosing. Pt in agreement - will call back Friday if swelling not improved.

## 2019-06-01 DIAGNOSIS — I4891 Unspecified atrial fibrillation: Secondary | ICD-10-CM | POA: Diagnosis not present

## 2019-06-01 DIAGNOSIS — I10 Essential (primary) hypertension: Secondary | ICD-10-CM | POA: Diagnosis not present

## 2019-06-01 DIAGNOSIS — Z7901 Long term (current) use of anticoagulants: Secondary | ICD-10-CM | POA: Diagnosis not present

## 2019-06-03 DIAGNOSIS — H401112 Primary open-angle glaucoma, right eye, moderate stage: Secondary | ICD-10-CM | POA: Diagnosis not present

## 2019-06-03 DIAGNOSIS — H401123 Primary open-angle glaucoma, left eye, severe stage: Secondary | ICD-10-CM | POA: Diagnosis not present

## 2019-06-08 ENCOUNTER — Ambulatory Visit (HOSPITAL_COMMUNITY)
Admission: RE | Admit: 2019-06-08 | Discharge: 2019-06-08 | Disposition: A | Discharge status: Home / Self Care | Payer: Medicare Other | Source: Ambulatory Visit | Attending: Nurse Practitioner | Admitting: Nurse Practitioner

## 2019-06-08 ENCOUNTER — Encounter (HOSPITAL_COMMUNITY): Payer: Self-pay | Admitting: Nurse Practitioner

## 2019-06-08 ENCOUNTER — Other Ambulatory Visit: Payer: Self-pay

## 2019-06-08 VITALS — BP 160/80 | HR 72 | Ht 64.5 in | Wt 103.6 lb

## 2019-06-08 DIAGNOSIS — H409 Unspecified glaucoma: Secondary | ICD-10-CM | POA: Diagnosis not present

## 2019-06-08 DIAGNOSIS — Z79899 Other long term (current) drug therapy: Secondary | ICD-10-CM | POA: Insufficient documentation

## 2019-06-08 DIAGNOSIS — I4892 Unspecified atrial flutter: Secondary | ICD-10-CM | POA: Diagnosis not present

## 2019-06-08 DIAGNOSIS — D649 Anemia, unspecified: Secondary | ICD-10-CM | POA: Insufficient documentation

## 2019-06-08 DIAGNOSIS — I2510 Atherosclerotic heart disease of native coronary artery without angina pectoris: Secondary | ICD-10-CM | POA: Insufficient documentation

## 2019-06-08 DIAGNOSIS — I10 Essential (primary) hypertension: Secondary | ICD-10-CM | POA: Diagnosis not present

## 2019-06-08 DIAGNOSIS — Z792 Long term (current) use of antibiotics: Secondary | ICD-10-CM | POA: Diagnosis not present

## 2019-06-08 DIAGNOSIS — I447 Left bundle-branch block, unspecified: Secondary | ICD-10-CM | POA: Diagnosis not present

## 2019-06-08 DIAGNOSIS — K219 Gastro-esophageal reflux disease without esophagitis: Secondary | ICD-10-CM | POA: Insufficient documentation

## 2019-06-08 DIAGNOSIS — Z8249 Family history of ischemic heart disease and other diseases of the circulatory system: Secondary | ICD-10-CM | POA: Diagnosis not present

## 2019-06-08 DIAGNOSIS — I480 Paroxysmal atrial fibrillation: Secondary | ICD-10-CM | POA: Insufficient documentation

## 2019-06-08 NOTE — Progress Notes (Signed)
Primary Care Physician: Merri BrunettePharr, Walter, MD Referring Physician: device clinic Cardiologist: Dr. Vira AgarAllred    Rhonda [name] is a 85 y.o. female with a h/o paroxysmal afib/flutter, PPM with second degree Mobitz II AV block, CAD, HTN that is in the afib clinic for device cliinic seeing persistent flutter since 11/17. Husband did note that her HR yesterday and  stayed around 120-130 bpm. She felt  tired but otherwise feeling ok. She was noted to have a lot of interment atrial flutter the first of the month but Dr. Johney FrameAllred recommeded conservative treatment/ no other recommendations 2/2 advanced age. She has been taking 30 mg Cardizem at home without any improvement.   Her atrial flutter continued with RVR which she was not tolerating well so she was set up for TEE guided cardioversion which was successful. She continues in SR today. She feels improved.   Today, she denies symptoms of palpitations, chest pain, shortness of breath, orthopnea, PND, lower extremity edema, dizziness, presyncope, syncope, or neurologic sequela. The patient is tolerating medications without difficulties and is otherwise without complaint today.   Past Medical History:  Diagnosis Date  . Anemia   . Bilateral carotid bruits   . Coronary disease    s/p PTCA 1990s  . GERD (gastroesophageal reflux disease)    takes Protonix daily  . GI bleed    previously with pradaxa  . Glaucoma    uses Eye Drops daily  . H/O hiatal hernia   . Hyperlipidemia    takes Atorvastatin daily  . Hypertension    takes Azor and Apresoline daily  . LBBB (left bundle branch block)   . Pacemaker    MDT Advisa May 2013, Dr. Salena Saner  . PAF (paroxysmal atrial fibrillation) [facility](HCC)    PVI ablation May 2013, takes Coumadin daily  . Sore throat    since anesthesia in Feb 2015 AND ALLEGERIES   Past Surgical History:  Procedure Laterality Date  . atrial fibrillation ablation  11/12/11   PVI by Dr Johney FrameAllred  . ATRIAL FIBRILLATION ABLATION N/A 11/12/2011    Procedure: ATRIAL FIBRILLATION ABLATION;  Surgeon: Hillis RangeJames Allred, MD;  [facility];  Service: Cardiovascular;  Laterality: N/A;  . CARDIOVERSION N/A 05/26/2019   Procedure: CARDIOVERSION;  Surgeon: Chilton Siandolph, Tiffany, MD;  [facility];  Service: Cardiovascular;  Laterality: N/A;  . CATARACT EXTRACTION W/ INTRAOCULAR LENS  IMPLANT, BILATERAL    . COLONOSCOPY    . CORONARY ANGIOPLASTY  1990s   by Dr Aleen Campiysinger  . ESOPHAGOGASTRECTOMY    . EYE SURGERY     GLAUCOMA SURG LT EYE  . KYPHOPLASTY N/A 08/09/2013   Procedure: LUMBAR TWO KYPHOPLASTY;  Surgeon: Barnett AbuHenry Elsner, MD;  Location: [facility] NEURO ORS;  Service: Neurosurgery;  Laterality: N/A;  L2 Kyphoplasty  . KYPHOPLASTY N/A 09/07/2013   Procedure: L1 KYPHOPLASTY;  Surgeon: Barnett AbuHenry Elsner, MD;  Location: [facility] NEURO ORS;  Service: Neurosurgery;  Laterality: N/A;  . KYPHOPLASTY N/A 10/04/2013   Procedure: T12 KYPHOPLASTY;  Surgeon: Barnett AbuHenry Elsner, MD;  Location: [facility] NEURO ORS;  Service: Neurosurgery;  Laterality: N/A;  T12 KYPHOPLASTY  . PACEMAKER INSERTION  11/13/11   MDT implanted by Dr Royann Shiversroitoru for 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 observed on EP study  . PERMANENT PACEMAKER INSERTION N/A 11/13/2011   Procedure: PERMANENT PACEMAKER INSERTION;  Surgeon: Thurmon FairMihai Croitoru, MD;  Location: [facility] CATH LAB;  Service: Cardiovascular;  Laterality: N/A;  . TEE WITHOUT CARDIOVERSION  11/11/2011   Procedure: TRANSESOPHAGEAL ECHOCARDIOGRAM (TEE);  Surgeon: Lisette AbuKenneth C.  Hilty, MD;  Location: [facility] ENDOSCOPY;  Service: Cardiovascular;  Laterality: N/A;  . TEE WITHOUT CARDIOVERSION N/A 05/26/2019   Procedure: TRANSESOPHAGEAL ECHOCARDIOGRAM (TEE);  Surgeon: Chilton Si, MD;  Location: [facility] ENDOSCOPY;  Service: Cardiovascular;  Laterality: N/A;  . TEMPORARY PACEMAKER INSERTION  11/12/2011   Procedure: TEMPORARY PACEMAKER INSERTION;  Surgeon: [name], MD;  Location: [facility] CATH LAB;  Service: Cardiovascular;;    Current Outpatient Medications  Medication Sig  Dispense Refill  . atorvastatin (LIPITOR) 40 MG tablet Take 40 mg by mouth every evening.   2  . brimonidine (ALPHAGAN P) 0.1 % SOLN Instill 3 drops in the right eye daily    . Cholecalciferol (VITAMIN D3) 50 MCG (2000 UT) TABS Take 2,000 Units by mouth daily with lunch.    . denosumab (PROLIA) 60 MG/ML SOLN injection Inject 60 mg into the skin every 6 (six) months. Administer in upper arm, thigh, or abdomen    . diltiazem (CARDIZEM CD) 120 MG 24 hr capsule Take 1 capsule (120 mg total) by mouth daily. (Patient taking differently: Take 120 mg by mouth every evening. ) 30 capsule 3  . diltiazem (CARDIZEM) 30 MG tablet take 1 tablet every 4 hours AS NEEDED for heart rate >100 as long as blood pressure >100. (Patient taking differently: Take 30 mg by mouth every 4 (four) hours as needed (heart rate >100 as long as blood pressure >100). ) 45 tablet 3  . dorzolamide (TRUSOPT) 2 % ophthalmic solution Place 1 drop into the right eye 3 (three) times daily.     . furosemide (LASIX) 20 MG tablet Take 60 mg by mouth daily.    . Latanoprostene Bunod (VYZULTA) 0.024 % SOLN Place 1 drop into the right eye at bedtime.     . nitroGLYCERIN (NITROSTAT) 0.4 MG SL tablet Place 0.4 mg under the tongue every 5 (five) minutes as needed. For chest pain.    Marland Kitchen PAZEO 0.7 % SOLN Place 1 drop into the left eye daily. Use as directed   5  . warfarin (COUMADIN) 5 MG tablet Take 2.5-5 mg by mouth See admin instructions. Take 1 tablet (5 mg) by mouth on Mondays, Wednesdays, & Fridays. Take 0.5 tablet (2.5 mg) by mouth on Sundays, Tuesdays, Thursdays, & Saturdays.     No current facility-administered medications for this encounter.     Allergies  Allergen Reactions  . Azor [Amlodipine-Olmesartan] Other (See Comments)    Higher doses cause ankles to swell  . Beta Adrenergic Blockers Other (See Comments)    Bradycardia.  . Norvasc [Amlodipine Besylate] Other (See Comments)    edema  . Pradaxa [Dabigatran Etexilate Mesylate]  Other (See Comments)    GI Bleed  . Questran [Cholestyramine] Other (See Comments)    Constipation  . Sulfa Drugs Cross Reactors Hives    Social History   Socioeconomic History  . Marital status: Married    Spouse name: Not on file  . Number of children: Not on file  . Years of education: Not on file  . Highest education level: Not on file  Occupational History  . Not on file  Social Needs  . Financial resource strain: Not on file  . Food insecurity    Worry: Not on file    Inability: Not on file  . Transportation needs    Medical: Not on file    Non-medical: Not on file  Tobacco Use  . Smoking status: Never Smoker  . Smokeless tobacco: Never Used  Substance and Sexual Activity  .  Alcohol use: No  . Drug use: No  . Sexual activity: Not Currently    Birth control/protection: Post-menopausal  Lifestyle  . Physical activity    Days per week: Not on file    Minutes per session: Not on file  . Stress: Not on file  Relationships  . Social Musician on phone: Not on file    Gets together: Not on file    Attends religious service: Not on file    Active member of club or organization: Not on file    Attends meetings of clubs or organizations: Not on file    Relationship status: Not on file  . Intimate partner violence    Fear of current or ex partner: Not on file    Emotionally abused: Not on file    Physically abused: Not on file    Forced sexual activity: Not on file  Other Topics Concern  . Not on file  Social History Narrative   Pt lives in [place].  Retired form [employer].   Attends [employer]    Family History  Problem Relation Age of Onset  . Heart disease Mother   . Heart disease Father   . Heart disease Brother   . Cancer Maternal Grandmother   . Heart disease Maternal Grandfather   . Pneumonia Paternal Grandmother   . Pneumonia Paternal Grandfather   . Heart disease Brother   . Heart disease Brother   . Pneumonia Sister      ROS- All systems are reviewed and negative except as per the HPI above  Physical Exam: Vitals:   06/08/19 1033  BP: (!) 160/80  Pulse: 72  Weight: 47 kg  Height: 5' 4.5" (1.638 m)   Wt Readings from Last 3 Encounters:  06/08/19 47 kg  05/20/19 47.8 kg  08/14/18 45.8 kg    Labs: Lab Results  Component Value Date   NA 139 05/24/2019   K 4.2 05/24/2019   CL 106 05/24/2019   CO2 27 05/24/2019   GLUCOSE 103 (H) 05/24/2019   BUN 19 05/24/2019   CREATININE 0.61 05/24/2019   CALCIUM 10.1 05/24/2019   MG 2.2 09/13/2010   Lab Results  Component Value Date   INR 2.6 (H) 05/24/2019   Lab Results  Component Value Date   CHOL  04/30/2010    149        ATP III CLASSIFICATION:  <200     mg/dL   Desirable  156-153  mg/dL   Borderline High  >=794    mg/dL   High          HDL 71 04/30/2010   LDLCALC  04/30/2010    70        Total Cholesterol/HDL:CHD Risk Coronary Heart Disease Risk Table                     Men   Women  1/2 Average Risk   3.4   3.3  Average Risk       5.0   4.4  2 X Average Risk   9.6   7.1  3 X Average Risk  23.4   11.0        Use the calculated Patient Ratio above and the CHD Risk Table to determine the patient's CHD Risk.        ATP III CLASSIFICATION (LDL):  <100     mg/dL   Optimal  327-614  mg/dL   Near or Above  Optimal  130-159  mg/dL   Borderline  160-189  mg/dL   High  >190     mg/dL   Very High   TRIG 42 04/30/2010     GEN- The patient is well appearing, alert and oriented x 3 today.   Head- normocephalic, atraumatic Eyes-  Sclera clear, conjunctiva pink Ears- hearing intact Oropharynx- clear Neck- supple, no JVP Lymph- no cervical lymphadenopathy Lungs- Clear to ausculation bilaterally, normal work of breathing Heart- regular rate and rhythm, no murmurs, rubs or gallops, PMI not laterally displaced GI- soft, NT, ND, + BS Extremities- no clubbing, cyanosis, or edema MS- no significant deformity or atrophy  Skin- no rash or lesion Psych- euthymic mood, full affect Neuro- strength and sensation are intact  EKG-SR with fusion complexes, LBBB   Assessment and Plan: 1. Atrial flutter  Persistent  since 11/17 Successful TEE guided cardioversion Continue  120 mg Cardizem daily, amlodipine stopped to get on board  She has  30 mg Cardizem if needed   2. CHA2DS2VASc score  of at least 4 Continue  warfarin  3. HTN Elevated in office States at home controlled   F/u with [facility] as scheduled   Butch Penny C. Laiana Fratus, [facility] [address] [place], [place] [address] [phone]

## 2019-06-09 DIAGNOSIS — I10 Essential (primary) hypertension: Secondary | ICD-10-CM | POA: Diagnosis not present

## 2019-06-09 DIAGNOSIS — N390 Urinary tract infection, site not specified: Secondary | ICD-10-CM | POA: Diagnosis not present

## 2019-06-11 DIAGNOSIS — H401112 Primary open-angle glaucoma, right eye, moderate stage: Secondary | ICD-10-CM | POA: Diagnosis not present

## 2019-06-11 DIAGNOSIS — H401123 Primary open-angle glaucoma, left eye, severe stage: Secondary | ICD-10-CM | POA: Diagnosis not present

## 2019-06-14 DIAGNOSIS — Z0001 Encounter for general adult medical examination with abnormal findings: Secondary | ICD-10-CM | POA: Diagnosis not present

## 2019-06-14 DIAGNOSIS — R636 Underweight: Secondary | ICD-10-CM | POA: Diagnosis not present

## 2019-06-14 DIAGNOSIS — I779 Disorder of arteries and arterioles, unspecified: Secondary | ICD-10-CM | POA: Diagnosis not present

## 2019-06-14 DIAGNOSIS — R918 Other nonspecific abnormal finding of lung field: Secondary | ICD-10-CM | POA: Diagnosis not present

## 2019-06-15 DIAGNOSIS — I4891 Unspecified atrial fibrillation: Secondary | ICD-10-CM | POA: Diagnosis not present

## 2019-06-15 DIAGNOSIS — Z7901 Long term (current) use of anticoagulants: Secondary | ICD-10-CM | POA: Diagnosis not present

## 2019-06-15 LAB — PROTIME-INR: INR: 2.8 — AB (ref 0.9–1.1)

## 2019-06-22 DIAGNOSIS — M810 Age-related osteoporosis without current pathological fracture: Secondary | ICD-10-CM | POA: Diagnosis not present

## 2019-06-24 ENCOUNTER — Ambulatory Visit (INDEPENDENT_AMBULATORY_CARE_PROVIDER_SITE_OTHER): Payer: Medicare Other | Admitting: *Deleted

## 2019-06-24 DIAGNOSIS — I441 Atrioventricular block, second degree: Secondary | ICD-10-CM

## 2019-06-25 LAB — CUP PACEART REMOTE DEVICE CHECK
Battery Impedance: 933 Ohm
Battery Remaining Longevity: 67 mo
Battery Voltage: 2.77 V
Brady Statistic AP VP Percent: 0 %
Brady Statistic AP VS Percent: 62 %
Brady Statistic AS VP Percent: 0 %
Brady Statistic AS VS Percent: 38 %
Date Time Interrogation Session: 20201224085650
Implantable Lead Implant Date: 20130515
Implantable Lead Implant Date: 20130515
Implantable Lead Location: 753859
Implantable Lead Location: 753860
Implantable Lead Model: 5076
Implantable Lead Model: 5076
Implantable Pulse Generator Implant Date: 20130515
Lead Channel Impedance Value: 430 Ohm
Lead Channel Impedance Value: 765 Ohm
Lead Channel Pacing Threshold Amplitude: 0.5 V
Lead Channel Pacing Threshold Amplitude: 0.875 V
Lead Channel Pacing Threshold Pulse Width: 0.4 ms
Lead Channel Pacing Threshold Pulse Width: 0.4 ms
Lead Channel Setting Pacing Amplitude: 2 V
Lead Channel Setting Pacing Amplitude: 2.5 V
Lead Channel Setting Pacing Pulse Width: 0.4 ms
Lead Channel Setting Sensing Sensitivity: 5.6 mV

## 2019-07-08 DIAGNOSIS — H401112 Primary open-angle glaucoma, right eye, moderate stage: Secondary | ICD-10-CM | POA: Diagnosis not present

## 2019-07-08 DIAGNOSIS — H401123 Primary open-angle glaucoma, left eye, severe stage: Secondary | ICD-10-CM | POA: Diagnosis not present

## 2019-07-13 DIAGNOSIS — I4891 Unspecified atrial fibrillation: Secondary | ICD-10-CM | POA: Diagnosis not present

## 2019-07-13 DIAGNOSIS — Z7901 Long term (current) use of anticoagulants: Secondary | ICD-10-CM | POA: Diagnosis not present

## 2019-08-10 DIAGNOSIS — I4891 Unspecified atrial fibrillation: Secondary | ICD-10-CM | POA: Diagnosis not present

## 2019-08-10 DIAGNOSIS — Z7901 Long term (current) use of anticoagulants: Secondary | ICD-10-CM | POA: Diagnosis not present

## 2019-08-14 ENCOUNTER — Ambulatory Visit: Payer: Medicare Other | Attending: Internal Medicine

## 2019-08-14 DIAGNOSIS — Z23 Encounter for immunization: Secondary | ICD-10-CM | POA: Insufficient documentation

## 2019-08-14 NOTE — Progress Notes (Signed)
   Covid-19 Vaccination Clinic  Name:  Rhonda [name]    MRN: [id number] DOB: 1929-09-01  08/14/2019  [name] was observed post Covid-19 immunization for 15 minutes without incidence. She was provided with Vaccine Information Sheet and instruction to access the V-Safe system.   [name] was instructed to call 911 with any severe reactions post vaccine: Marland Kitchen Difficulty breathing  . Swelling of your face and throat  . A fast heartbeat  . A bad rash all over your body  . Dizziness and weakness    Immunizations Administered    Name Date Dose VIS Date Route   Pfizer COVID-19 Vaccine 08/14/2019  8:36 AM 0.3 mL 06/11/2019 Intramuscular   Manufacturer: [employer], [employer]   Lot: [id number]   NDC: [address]-7

## 2019-08-17 ENCOUNTER — Telehealth: Payer: Self-pay | Admitting: Internal Medicine

## 2019-08-17 NOTE — Telephone Encounter (Signed)
Informed patient that if she has physical impairment and needs assistance that her husband can come to help. Patient stated she needed his help. Will put note on patient's appointment so he can help her to the visit.

## 2019-08-17 NOTE — Telephone Encounter (Signed)
New message   Patient states that her husband will be coming with her to the office visit with Dr. Johney Frame because she has difficulty walking. Please advise.

## 2019-08-19 ENCOUNTER — Encounter: Payer: Medicare Other | Admitting: Internal Medicine

## 2019-08-23 ENCOUNTER — Encounter: Payer: Medicare Other | Admitting: Internal Medicine

## 2019-08-23 DIAGNOSIS — L821 Other seborrheic keratosis: Secondary | ICD-10-CM | POA: Diagnosis not present

## 2019-08-23 DIAGNOSIS — L905 Scar conditions and fibrosis of skin: Secondary | ICD-10-CM | POA: Diagnosis not present

## 2019-08-30 ENCOUNTER — Ambulatory Visit: Payer: Medicare Other | Admitting: Internal Medicine

## 2019-08-30 ENCOUNTER — Encounter: Payer: Self-pay | Admitting: Internal Medicine

## 2019-08-30 ENCOUNTER — Other Ambulatory Visit: Payer: Self-pay

## 2019-08-30 VITALS — BP 152/88 | HR 73 | Ht 64.0 in | Wt 105.0 lb

## 2019-08-30 DIAGNOSIS — I10 Essential (primary) hypertension: Secondary | ICD-10-CM

## 2019-08-30 DIAGNOSIS — I441 Atrioventricular block, second degree: Secondary | ICD-10-CM

## 2019-08-30 DIAGNOSIS — Z950 Presence of cardiac pacemaker: Secondary | ICD-10-CM | POA: Diagnosis not present

## 2019-08-30 DIAGNOSIS — I480 Paroxysmal atrial fibrillation: Secondary | ICD-10-CM | POA: Diagnosis not present

## 2019-08-30 LAB — CUP PACEART INCLINIC DEVICE CHECK
Battery Impedance: 982 Ohm
Battery Remaining Longevity: 65 mo
Battery Voltage: 2.76 V
Brady Statistic AP VP Percent: 0 %
Brady Statistic AP VS Percent: 65 %
Brady Statistic AS VP Percent: 0 %
Brady Statistic AS VS Percent: 35 %
Date Time Interrogation Session: 20210301151048
Implantable Lead Implant Date: 20130515
Implantable Lead Implant Date: 20130515
Implantable Lead Location: 753859
Implantable Lead Location: 753860
Implantable Lead Model: 5076
Implantable Lead Model: 5076
Implantable Pulse Generator Implant Date: 20130515
Lead Channel Impedance Value: 419 Ohm
Lead Channel Impedance Value: 825 Ohm
Lead Channel Pacing Threshold Amplitude: 0.5 V
Lead Channel Pacing Threshold Amplitude: 1 V
Lead Channel Pacing Threshold Pulse Width: 0.4 ms
Lead Channel Pacing Threshold Pulse Width: 0.4 ms
Lead Channel Sensing Intrinsic Amplitude: 15.67 mV
Lead Channel Sensing Intrinsic Amplitude: 4 mV
Lead Channel Setting Pacing Amplitude: 2 V
Lead Channel Setting Pacing Amplitude: 2.5 V
Lead Channel Setting Pacing Pulse Width: 0.4 ms
Lead Channel Setting Sensing Sensitivity: 5.6 mV

## 2019-08-30 MED ORDER — DILTIAZEM HCL ER COATED BEADS 120 MG PO CP24: 120.0000 mg | ORAL_CAPSULE | Freq: Every day | ORAL | 3 refills | Status: DC

## 2019-08-30 NOTE — Patient Instructions (Signed)
Medication Instructions:  Your physician recommends that you continue on your current medications as directed. Please refer to the Current Medication list given to you today.  Labwork: None ordered.  Testing/Procedures: None ordered.  Follow-Up: Your physician wants you to follow-up in: one year with Rhonda Balsam, NP.   You will receive a reminder letter in the mail two months in advance. If you don't receive a letter, please call our office to schedule the follow-up appointment.  Remote monitoring is used to monitor your Pacemaker from home. This monitoring reduces the number of office visits required to check your device to one time per year. It allows [place] to keep an eye on the functioning of your device to ensure it is working properly. You are scheduled for a device check from home on 09/23/2019. You may send your transmission at any time that day. If you have a wireless device, the transmission will be sent automatically. After your physician reviews your transmission, you will receive a postcard with your next transmission date.  Any Other Special Instructions Will Be Listed Below (If Applicable).  If you need a refill on your cardiac medications before your next appointment, please call your pharmacy.

## 2019-08-30 NOTE — Addendum Note (Signed)
Addended by: Roney Mans A on: 08/30/2019 03:01 PM   Modules accepted: Orders

## 2019-08-30 NOTE — Progress Notes (Signed)
PCP: [name], MD   Primary EP:  Dr Johney Frame  Rhonda [name] is a 86 y.o. female who presents today for routine electrophysiology followup.  Since last being seen in our clinic, the patient reports doing very well.  She has had some atrial fibrillation as well as atrial flutter for which she was seen in December in the AF clinic. She underwent cardioversion and has done very well since that time.  Today, she denies symptoms of palpitations, chest pain, shortness of breath,  lower extremity edema, dizziness, presyncope, or syncope.  The patient is otherwise without complaint today.   Past Medical History:  Diagnosis Date  . Anemia   . Bilateral carotid bruits   . Coronary disease    s/p PTCA 1990s  . GERD (gastroesophageal reflux disease)    takes Protonix daily  . GI bleed    previously with pradaxa  . Glaucoma    uses Eye Drops daily  . H/O hiatal hernia   . Hyperlipidemia    takes Atorvastatin daily  . Hypertension    takes Azor and Apresoline daily  . LBBB (left bundle branch block)   . Pacemaker    MDT Advisa May 2013, Dr. Salena Saner  . PAF (paroxysmal atrial fibrillation) [facility])    PVI ablation May 2013, takes Coumadin daily  . Sore throat    since anesthesia in Feb 2015 AND ALLEGERIES   Past Surgical History:  Procedure Laterality Date  . atrial fibrillation ablation  11/12/11   PVI by Dr Johney Frame  . ATRIAL FIBRILLATION ABLATION N/A 11/12/2011   Procedure: ATRIAL FIBRILLATION ABLATION;  Surgeon: [name], MD;  Location: [facility] CATH LAB;  Service: Cardiovascular;  Laterality: N/A;  . CARDIOVERSION N/A 05/26/2019   Procedure: CARDIOVERSION;  Surgeon: Chilton Si, MD;  Location: [facility] ENDOSCOPY;  Service: Cardiovascular;  Laterality: N/A;  . CATARACT EXTRACTION W/ INTRAOCULAR LENS  IMPLANT, BILATERAL    . COLONOSCOPY    . CORONARY ANGIOPLASTY  1990s   by Dr Aleen Campi  . ESOPHAGOGASTRECTOMY    . EYE SURGERY     GLAUCOMA SURG LT EYE  . KYPHOPLASTY N/A 08/09/2013   Procedure: LUMBAR TWO KYPHOPLASTY;  Surgeon: Barnett Abu, MD;  Location: [facility] NEURO ORS;  Service: Neurosurgery;  Laterality: N/A;  L2 Kyphoplasty  . KYPHOPLASTY N/A 09/07/2013   Procedure: L1 KYPHOPLASTY;  Surgeon: Barnett Abu, MD;  Location: [facility] NEURO ORS;  Service: Neurosurgery;  Laterality: N/A;  . KYPHOPLASTY N/A 10/04/2013   Procedure: T12 KYPHOPLASTY;  Surgeon: Barnett Abu, MD;  Location: [facility] NEURO ORS;  Service: Neurosurgery;  Laterality: N/A;  T12 KYPHOPLASTY  . PACEMAKER INSERTION  11/13/11   MDT implanted by Dr Royann Shivers for 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 observed on EP study  . PERMANENT PACEMAKER INSERTION N/A 11/13/2011   Procedure: PERMANENT PACEMAKER INSERTION;  Surgeon: Thurmon Fair, MD;  Location: [facility] CATH LAB;  Service: Cardiovascular;  Laterality: N/A;  . TEE WITHOUT CARDIOVERSION  11/11/2011   Procedure: TRANSESOPHAGEAL ECHOCARDIOGRAM (TEE);  Surgeon: Chrystie Nose, MD;  Location: [facility] ENDOSCOPY;  Service: Cardiovascular;  Laterality: N/A;  . TEE WITHOUT CARDIOVERSION N/A 05/26/2019   Procedure: TRANSESOPHAGEAL ECHOCARDIOGRAM (TEE);  Surgeon: Chilton Si, MD;  Location: [facility] ENDOSCOPY;  Service: Cardiovascular;  Laterality: N/A;  . TEMPORARY PACEMAKER INSERTION  11/12/2011   Procedure: TEMPORARY PACEMAKER INSERTION;  Surgeon: [name], MD;  Location: [facility] CATH LAB;  Service: Cardiovascular;;    ROS- all systems are reviewed and negative except as per HPI above  Current Outpatient Medications  Medication Sig Dispense Refill  . atorvastatin (LIPITOR) 40 MG tablet Take 40 mg by mouth every evening.   2  . brimonidine (ALPHAGAN P) 0.1 % SOLN Instill 3 drops in the right eye daily    . Cholecalciferol (VITAMIN D3) 50 MCG (2000 UT) TABS Take 2,000 Units by mouth daily with lunch.    . denosumab (PROLIA) 60 MG/ML SOLN injection Inject 60 mg into the skin every 6 (six) months. Administer in upper arm, thigh, or abdomen    . diltiazem (CARDIZEM CD) 120 MG 24 hr capsule  Take 1 capsule (120 mg total) by mouth daily. 30 capsule 3  . diltiazem (CARDIZEM) 30 MG tablet take 1 tablet every 4 hours AS NEEDED for heart rate >100 as long as blood pressure >100. 45 tablet 3  . dorzolamide (TRUSOPT) 2 % ophthalmic solution Place 1 drop into the right eye 3 (three) times daily.     . furosemide (LASIX) 20 MG tablet Take 60 mg by mouth daily.    . Latanoprostene Bunod (VYZULTA) 0.024 % SOLN Place 1 drop into the right eye at bedtime.     . nitroGLYCERIN (NITROSTAT) 0.4 MG SL tablet Place 0.4 mg under the tongue every 5 (five) minutes as needed. For chest pain.    Marland Kitchen warfarin (COUMADIN) 5 MG tablet Take 2.5-5 mg by mouth See admin instructions. Take 1 tablet (5 mg) by mouth on Mondays, Wednesdays, & Fridays. Take 0.5 tablet (2.5 mg) by mouth on Sundays, Tuesdays, Thursdays, & Saturdays.     No current facility-administered medications for this visit.    Physical Exam: Vitals:   08/30/19 1414  BP: (!) 152/88  Pulse: 73  SpO2: 98%  Weight: 105 lb (47.6 kg)  Height: 5\' 4"  (1.626 m)    GEN- The patient is elderly appearing, alert and oriented x 3 today.   Head- normocephalic, atraumatic Eyes-  Sclera clear, conjunctiva pink Ears- hearing intact Oropharynx- clear Lungs-   normal work of breathing Chest- pacemaker pocket is well healed Heart- Regular rate and rhythm  GI- soft, NT, ND, + BS Extremities- no clubbing, cyanosis, or edema  Pacemaker interrogation- reviewed in detail today,  See PACEART report   Assessment and Plan:  1. Mobitz II second degree heart block Normal pacemaker function See Pace Art report No changes today she is not device dependant today  2. Atrial fibrillation/ atrial flutter (persistent) Doing well s/p cardioversion On coumadin for chads2vasc score of 4 Continue low dose diltiazem  3. HTN Elevated but she reports good BP control at home Does not wish to make changes today No change required today  4. CAD No ischemic  symptoms  Return in a year to see EP NP  Thompson Grayer MD, [facility] 08/30/2019 2:29 PM

## 2019-09-05 ENCOUNTER — Ambulatory Visit: Payer: Medicare Other | Attending: Internal Medicine

## 2019-09-05 DIAGNOSIS — Z23 Encounter for immunization: Secondary | ICD-10-CM | POA: Insufficient documentation

## 2019-09-05 NOTE — Progress Notes (Signed)
   Covid-19 Vaccination Clinic  Name:  ORLI DEGRAVE    MRN: [id number] DOB: Nov 16, 1929  09/05/2019  Ms. Yount was observed post Covid-19 immunization for 15 minutes without incident. She was provided with Vaccine Information Sheet and instruction to access the V-Safe system.   Ms. Clinkenbeard was instructed to call 911 with any severe reactions post vaccine: Marland Kitchen Difficulty breathing  . Swelling of face and throat  . A fast heartbeat  . A bad rash all over body  . Dizziness and weakness   Immunizations Administered    Name Date Dose VIS Date Route   Pfizer COVID-19 Vaccine 09/05/2019 12:08 PM 0.3 mL 06/11/2019 Intramuscular   Manufacturer: [employer], [employer]   Lot: [id number]   NDC: [address]-1

## 2019-09-07 DIAGNOSIS — Z7901 Long term (current) use of anticoagulants: Secondary | ICD-10-CM | POA: Diagnosis not present

## 2019-09-07 DIAGNOSIS — I4891 Unspecified atrial fibrillation: Secondary | ICD-10-CM | POA: Diagnosis not present

## 2019-09-23 ENCOUNTER — Ambulatory Visit (INDEPENDENT_AMBULATORY_CARE_PROVIDER_SITE_OTHER): Payer: Medicare Other | Admitting: *Deleted

## 2019-09-23 DIAGNOSIS — I441 Atrioventricular block, second degree: Secondary | ICD-10-CM | POA: Diagnosis not present

## 2019-09-23 LAB — CUP PACEART REMOTE DEVICE CHECK
Battery Impedance: 1037 Ohm
Battery Remaining Longevity: 63 mo
Battery Voltage: 2.76 V
Brady Statistic AP VP Percent: 0 %
Brady Statistic AP VS Percent: 58 %
Brady Statistic AS VP Percent: 0 %
Brady Statistic AS VS Percent: 42 %
Date Time Interrogation Session: 20210325095015
Implantable Lead Implant Date: 20130515
Implantable Lead Implant Date: 20130515
Implantable Lead Location: 753859
Implantable Lead Location: 753860
Implantable Lead Model: 5076
Implantable Lead Model: 5076
Implantable Pulse Generator Implant Date: 20130515
Lead Channel Impedance Value: 419 Ohm
Lead Channel Impedance Value: 760 Ohm
Lead Channel Pacing Threshold Amplitude: 0.5 V
Lead Channel Pacing Threshold Amplitude: 0.875 V
Lead Channel Pacing Threshold Pulse Width: 0.4 ms
Lead Channel Pacing Threshold Pulse Width: 0.4 ms
Lead Channel Setting Pacing Amplitude: 2 V
Lead Channel Setting Pacing Amplitude: 2.5 V
Lead Channel Setting Pacing Pulse Width: 0.4 ms
Lead Channel Setting Sensing Sensitivity: 5.6 mV

## 2019-09-23 NOTE — Progress Notes (Signed)
PPM Remote  

## 2019-10-05 DIAGNOSIS — Z7901 Long term (current) use of anticoagulants: Secondary | ICD-10-CM | POA: Diagnosis not present

## 2019-10-05 DIAGNOSIS — I4891 Unspecified atrial fibrillation: Secondary | ICD-10-CM | POA: Diagnosis not present

## 2019-10-07 DIAGNOSIS — H401112 Primary open-angle glaucoma, right eye, moderate stage: Secondary | ICD-10-CM | POA: Diagnosis not present

## 2019-10-07 DIAGNOSIS — H401123 Primary open-angle glaucoma, left eye, severe stage: Secondary | ICD-10-CM | POA: Diagnosis not present

## 2019-11-02 DIAGNOSIS — I4891 Unspecified atrial fibrillation: Secondary | ICD-10-CM | POA: Diagnosis not present

## 2019-11-02 DIAGNOSIS — Z7901 Long term (current) use of anticoagulants: Secondary | ICD-10-CM | POA: Diagnosis not present

## 2019-11-30 DIAGNOSIS — Z7901 Long term (current) use of anticoagulants: Secondary | ICD-10-CM | POA: Diagnosis not present

## 2019-11-30 DIAGNOSIS — I4891 Unspecified atrial fibrillation: Secondary | ICD-10-CM | POA: Diagnosis not present

## 2019-12-23 ENCOUNTER — Ambulatory Visit (INDEPENDENT_AMBULATORY_CARE_PROVIDER_SITE_OTHER): Payer: Medicare Other | Admitting: *Deleted

## 2019-12-23 DIAGNOSIS — I441 Atrioventricular block, second degree: Secondary | ICD-10-CM

## 2019-12-23 DIAGNOSIS — M810 Age-related osteoporosis without current pathological fracture: Secondary | ICD-10-CM | POA: Diagnosis not present

## 2019-12-23 LAB — CUP PACEART REMOTE DEVICE CHECK
Battery Impedance: 1169 Ohm
Battery Remaining Longevity: 58 mo
Battery Voltage: 2.76 V
Brady Statistic AP VP Percent: 0 %
Brady Statistic AP VS Percent: 57 %
Brady Statistic AS VP Percent: 0 %
Brady Statistic AS VS Percent: 43 %
Date Time Interrogation Session: 20210624100430
Implantable Lead Implant Date: 20130515
Implantable Lead Implant Date: 20130515
Implantable Lead Location: 753859
Implantable Lead Location: 753860
Implantable Lead Model: 5076
Implantable Lead Model: 5076
Implantable Pulse Generator Implant Date: 20130515
Lead Channel Impedance Value: 423 Ohm
Lead Channel Impedance Value: 718 Ohm
Lead Channel Pacing Threshold Amplitude: 0.5 V
Lead Channel Pacing Threshold Amplitude: 0.875 V
Lead Channel Pacing Threshold Pulse Width: 0.4 ms
Lead Channel Pacing Threshold Pulse Width: 0.4 ms
Lead Channel Setting Pacing Amplitude: 2 V
Lead Channel Setting Pacing Amplitude: 2.5 V
Lead Channel Setting Pacing Pulse Width: 0.4 ms
Lead Channel Setting Sensing Sensitivity: 5.6 mV

## 2019-12-24 NOTE — Progress Notes (Signed)
Remote pacemaker transmission.   

## 2019-12-28 DIAGNOSIS — Z7901 Long term (current) use of anticoagulants: Secondary | ICD-10-CM | POA: Diagnosis not present

## 2019-12-28 DIAGNOSIS — I4891 Unspecified atrial fibrillation: Secondary | ICD-10-CM | POA: Diagnosis not present

## 2020-01-06 DIAGNOSIS — H02883 Meibomian gland dysfunction of right eye, unspecified eyelid: Secondary | ICD-10-CM | POA: Diagnosis not present

## 2020-01-06 DIAGNOSIS — H401123 Primary open-angle glaucoma, left eye, severe stage: Secondary | ICD-10-CM | POA: Diagnosis not present

## 2020-01-06 DIAGNOSIS — H401112 Primary open-angle glaucoma, right eye, moderate stage: Secondary | ICD-10-CM | POA: Diagnosis not present

## 2020-01-06 DIAGNOSIS — H02886 Meibomian gland dysfunction of left eye, unspecified eyelid: Secondary | ICD-10-CM | POA: Diagnosis not present

## 2020-01-18 ENCOUNTER — Telehealth (HOSPITAL_COMMUNITY): Payer: Self-pay

## 2020-01-18 MED ORDER — DILTIAZEM HCL 30 MG PO TABS: ORAL_TABLET | ORAL | 3 refills | Status: DC

## 2020-01-18 NOTE — Telephone Encounter (Signed)
Pt called to request that she have brand name Diltiazem (Cartia XT) because she feels like the generic is not working for her. I explained to the patient that all we can do is either submit a prior approval or she can take her medications with the medication list back to the pharmacy and discuss with them. Pt is aware and agreeable.

## 2020-01-18 NOTE — Telephone Encounter (Signed)
Patient called and states her Afib started at 4am. She had taken her regular Cardizem 120mg  dose last night. She started taking her Cardizem 30mg  at 4am, 8am and 12pm. Her heart rate today at 1:30pm was 123 and blood pressure was 121/86. Patient feels she tolerated the brand name of the Cardizem much better than the generic form.I will send in the brand name of Cardizem 30mg  tablet to her pharmacy. Discussed with - NP and she advised patient to continue taking the Cardizem 30mg  every 4 hours as needed for heart rate greater than 100 and as long as blood pressure is above 100. Scheduled appointment for tomorrow 7/21 at 11:30am. Consulted with patient and she verbalized understanding.

## 2020-01-18 NOTE — Telephone Encounter (Signed)
Pt woke up in afib this am. Took 30 mg cardizem and then daily cardizem later in the am. Still in afib with v rates around 120 bpm. She just received a new generic of SA cardizem and does not feel it is as effective. She would like [place]  to call in brand name.  Recommend  taking the cardizem as needed  today and  if still in afib tomorrow can see in office at 11:30.

## 2020-01-19 ENCOUNTER — Telehealth (HOSPITAL_COMMUNITY): Payer: Self-pay

## 2020-01-19 ENCOUNTER — Other Ambulatory Visit: Payer: Self-pay

## 2020-01-19 ENCOUNTER — Ambulatory Visit (HOSPITAL_COMMUNITY)
Admission: RE | Admit: 2020-01-19 | Discharge: 2020-01-19 | Disposition: A | Discharge status: Home / Self Care | Payer: Medicare Other | Source: Ambulatory Visit | Attending: Nurse Practitioner | Admitting: Nurse Practitioner

## 2020-01-19 ENCOUNTER — Encounter (HOSPITAL_COMMUNITY): Payer: Self-pay | Admitting: Nurse Practitioner

## 2020-01-19 VITALS — BP 140/88 | HR 133 | Ht 64.0 in | Wt 104.6 lb

## 2020-01-19 DIAGNOSIS — I2510 Atherosclerotic heart disease of native coronary artery without angina pectoris: Secondary | ICD-10-CM | POA: Diagnosis not present

## 2020-01-19 DIAGNOSIS — Z79899 Other long term (current) drug therapy: Secondary | ICD-10-CM | POA: Diagnosis not present

## 2020-01-19 DIAGNOSIS — D6869 Other thrombophilia: Secondary | ICD-10-CM | POA: Diagnosis not present

## 2020-01-19 DIAGNOSIS — Z7901 Long term (current) use of anticoagulants: Secondary | ICD-10-CM | POA: Insufficient documentation

## 2020-01-19 DIAGNOSIS — I447 Left bundle-branch block, unspecified: Secondary | ICD-10-CM | POA: Insufficient documentation

## 2020-01-19 DIAGNOSIS — K219 Gastro-esophageal reflux disease without esophagitis: Secondary | ICD-10-CM | POA: Diagnosis not present

## 2020-01-19 DIAGNOSIS — I4892 Unspecified atrial flutter: Secondary | ICD-10-CM

## 2020-01-19 DIAGNOSIS — I4891 Unspecified atrial fibrillation: Secondary | ICD-10-CM | POA: Insufficient documentation

## 2020-01-19 DIAGNOSIS — I10 Essential (primary) hypertension: Secondary | ICD-10-CM | POA: Insufficient documentation

## 2020-01-19 DIAGNOSIS — H409 Unspecified glaucoma: Secondary | ICD-10-CM | POA: Diagnosis not present

## 2020-01-19 DIAGNOSIS — E785 Hyperlipidemia, unspecified: Secondary | ICD-10-CM | POA: Insufficient documentation

## 2020-01-19 NOTE — Patient Instructions (Signed)
Your physician has recommended you make the following change in your medication:  -- When you get home take 1 tablet of Diltiazem (Cartia XT) or (Cardizem CD) 120 mg -- Call the office at 4:00 pm and report your Blood Pressure and your Heart Rate -- If your HEART RATE and BLOOD PRESSURE (top number) are above 100, Take an additional tablet (120 mg) of Diltiazem -- If your HEART RATE and BLOOD PRESSURE (top number) are below 100, DO NOT take an additional tablet -- Take Diltiazem (Cartia XT or Cardizem CD) 120 mg tablet twice daily - Once in the morning and once in the evening until your follow up appointment on Friday 01/21/20 at 1:30 pm.

## 2020-01-19 NOTE — Progress Notes (Signed)
Primary Care Physician: [name], MD Referring Physician: device clinic Cardiologist: Rhonda [name] is a 86 y.o. female with a h/o paroxysmal afib/flutter, PPM with second degree Mobitz II AV block, CAD, HTN that is in the afib clinic for device clinic for pt reporting elevated heart rate since yesterday am. SHe took an extra 30 mg diltiazem to no avail  Controlling her v rates. She was concerned as the drug store replaced her Nicanor Alcon xt with diltiazem ER. She felt it may not be working as well, but the pharmacy reports that brand diltiazem is not being made any longer.  ekg shows atrail flutter at 130 bpm. BP is 140/88.   She had  atrial flutter with RVR , which she was not tolerating well in December ,  so she was set up for TEE guided cardioversion( on wafarin and it was felt that she would not do well waiting the 4 weeks for therapeutic INR's) which was successful. She continued in SR until  yesterday. She feels fatigued today but otherwise does not appear unstable.   Today, she denies symptoms of palpitations, chest pain, shortness of breath, orthopnea, PND, lower extremity edema, dizziness, presyncope, syncope, or neurologic sequela. The patient is tolerating medications without difficulties and is otherwise without complaint today.   Past Medical History:  Diagnosis Date  . Anemia   . Bilateral carotid bruits   . Coronary disease    s/p PTCA 1990s  . GERD (gastroesophageal reflux disease)    takes Protonix daily  . GI bleed    previously with pradaxa  . Glaucoma    uses Eye Drops daily  . H/O hiatal hernia   . Hyperlipidemia    takes Atorvastatin daily  . Hypertension    takes Azor and Apresoline daily  . LBBB (left bundle branch block)   . Pacemaker    MDT Advisa May 2013, Dr. Salena Saner  . PAF (paroxysmal atrial fibrillation) [facility])    PVI ablation May 2013, takes Coumadin daily  . Sore throat    since anesthesia in Feb 2015 AND ALLEGERIES   Past Surgical  History:  Procedure Laterality Date  . atrial fibrillation ablation  11/12/11   PVI by Dr Johney Frame  . ATRIAL FIBRILLATION ABLATION N/A 11/12/2011   Procedure: ATRIAL FIBRILLATION ABLATION;  Surgeon: [name], MD;  Location: [facility] CATH LAB;  Service: Cardiovascular;  Laterality: N/A;  . CARDIOVERSION N/A 05/26/2019   Procedure: CARDIOVERSION;  Surgeon: Chilton Si, MD;  Location: [facility] ENDOSCOPY;  Service: Cardiovascular;  Laterality: N/A;  . CATARACT EXTRACTION W/ INTRAOCULAR LENS  IMPLANT, BILATERAL    . COLONOSCOPY    . CORONARY ANGIOPLASTY  1990s   by Dr Aleen Campi  . ESOPHAGOGASTRECTOMY    . EYE SURGERY     GLAUCOMA SURG LT EYE  . KYPHOPLASTY N/A 08/09/2013   Procedure: LUMBAR TWO KYPHOPLASTY;  Surgeon: Barnett Abu, MD;  Location: [facility] NEURO ORS;  Service: Neurosurgery;  Laterality: N/A;  L2 Kyphoplasty  . KYPHOPLASTY N/A 09/07/2013   Procedure: L1 KYPHOPLASTY;  Surgeon: Barnett Abu, MD;  Location: [facility] NEURO ORS;  Service: Neurosurgery;  Laterality: N/A;  . KYPHOPLASTY N/A 10/04/2013   Procedure: T12 KYPHOPLASTY;  Surgeon: Barnett Abu, MD;  Location: [facility] NEURO ORS;  Service: Neurosurgery;  Laterality: N/A;  T12 KYPHOPLASTY  . PACEMAKER INSERTION  11/13/11   MDT implanted by Dr Royann Shivers for 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 observed on EP study  . PERMANENT PACEMAKER INSERTION N/A 11/13/2011  Procedure: PERMANENT PACEMAKER INSERTION;  Surgeon: Thurmon Fair, MD;  Location: [facility] CATH LAB;  Service: Cardiovascular;  Laterality: N/A;  . TEE WITHOUT CARDIOVERSION  11/11/2011   Procedure: TRANSESOPHAGEAL ECHOCARDIOGRAM (TEE);  Surgeon: Chrystie Nose, MD;  Location: [facility] ENDOSCOPY;  Service: Cardiovascular;  Laterality: N/A;  . TEE WITHOUT CARDIOVERSION N/A 05/26/2019   Procedure: TRANSESOPHAGEAL ECHOCARDIOGRAM (TEE);  Surgeon: Chilton Si, MD;  Location: [facility] ENDOSCOPY;  Service: Cardiovascular;  Laterality: N/A;  . TEMPORARY PACEMAKER INSERTION  11/12/2011   Procedure: TEMPORARY  PACEMAKER INSERTION;  Surgeon: [name], MD;  Location: [facility] CATH LAB;  Service: Cardiovascular;;    Current Outpatient Medications  Medication Sig Dispense Refill  . atorvastatin (LIPITOR) 40 MG tablet Take 40 mg by mouth every evening.   2  . brimonidine (ALPHAGAN P) 0.1 % SOLN Instill 3 drops in the right eye daily    . Cholecalciferol (VITAMIN D3) 50 MCG (2000 UT) TABS Take 2,000 Units by mouth daily with lunch.    . denosumab (PROLIA) 60 MG/ML SOLN injection Inject 60 mg into the skin every 6 (six) months. Administer in upper arm, thigh, or abdomen    . diltiazem (CARDIZEM CD) 120 MG 24 hr capsule Take 1 capsule (120 mg total) by mouth daily. 90 capsule 3  . diltiazem (CARDIZEM) 30 MG tablet take 1 tablet every 4 hours AS NEEDED for heart rate >100 as long as blood pressure >100. 45 tablet 3  . dorzolamide (TRUSOPT) 2 % ophthalmic solution Place 1 drop into the right eye 3 (three) times daily.     . furosemide (LASIX) 20 MG tablet Take 60 mg by mouth daily.    . Latanoprostene Bunod (VYZULTA) 0.024 % SOLN Place 1 drop into the right eye at bedtime.     . nitroGLYCERIN (NITROSTAT) 0.4 MG SL tablet Place 0.4 mg under the tongue every 5 (five) minutes as needed. For chest pain.    Marland Kitchen warfarin (COUMADIN) 5 MG tablet Take 2.5-5 mg by mouth See admin instructions. Take 1 tablet (5 mg) by mouth on Mondays, Wednesdays, & Fridays. Take 0.5 tablet (2.5 mg) by mouth on Sundays, Tuesdays, Thursdays, & Saturdays.     No current facility-administered medications for this encounter.    Allergies  Allergen Reactions  . Azor [Amlodipine-Olmesartan] Other (See Comments)    Higher doses cause ankles to swell  . Beta Adrenergic Blockers Other (See Comments)    Bradycardia.  . Norvasc [Amlodipine Besylate] Other (See Comments)    edema  . Pradaxa [Dabigatran Etexilate Mesylate] Other (See Comments)    GI Bleed  . Questran [Cholestyramine] Other (See Comments)    Constipation  . Sulfa Drugs Cross  Reactors Hives    Social History   Socioeconomic History  . Marital status: Married    Spouse name: Not on file  . Number of children: Not on file  . Years of education: Not on file  . Highest education level: Not on file  Occupational History  . Not on file  Tobacco Use  . Smoking status: Never Smoker  . Smokeless tobacco: Never Used  Vaping Use  . Vaping Use: Never used  Substance and Sexual Activity  . Alcohol use: No  . Drug use: No  . Sexual activity: Not Currently    Birth control/protection: Post-menopausal  Other Topics Concern  . Not on file  Social History Narrative   Pt lives in [place].  Retired form [employer].   Attends [employer]   Social Determinants of Health  Financial Resource Strain:   . Difficulty of Paying Living Expenses:   Food Insecurity:   . Worried About Programme researcher, broadcasting/film/video in the Last Year:   . Barista in the Last Year:   Transportation Needs:   . Freight forwarder (Medical):   Marland Kitchen Lack of Transportation (Non-Medical):   Physical Activity:   . Days of Exercise per Week:   . Minutes of Exercise per Session:   Stress:   . Feeling of Stress :   Social Connections:   . Frequency of Communication with Friends and Family:   . Frequency of Social Gatherings with Friends and Family:   . Attends Religious Services:   . Active Member of Clubs or Organizations:   . Attends Banker Meetings:   Marland Kitchen Marital Status:   Intimate Partner Violence:   . Fear of Current or Ex-Partner:   . Emotionally Abused:   Marland Kitchen Physically Abused:   . Sexually Abused:     Family History  Problem Relation Age of Onset  . Heart disease Mother   . Heart disease Father   . Heart disease Brother   . Cancer Maternal Grandmother   . Heart disease Maternal Grandfather   . Pneumonia Paternal Grandmother   . Pneumonia Paternal Grandfather   . Heart disease Brother   . Heart disease Brother   . Pneumonia Sister     ROS- All systems  are reviewed and negative except as per the HPI above  Physical Exam: Vitals:   01/19/20 1200  BP: 140/88  Pulse: (!) 133  SpO2: 98%  Weight: 47.4 kg  Height: 5\' 4"  (1.626 m)   Wt Readings from Last 3 Encounters:  01/19/20 47.4 kg  08/30/19 47.6 kg  06/08/19 47 kg    Labs: Lab Results  Component Value Date   NA 139 05/24/2019   K 4.2 05/24/2019   CL 106 05/24/2019   CO2 27 05/24/2019   GLUCOSE 103 (H) 05/24/2019   BUN 19 05/24/2019   CREATININE 0.61 05/24/2019   CALCIUM 10.1 05/24/2019   MG 2.2 09/13/2010   Lab Results  Component Value Date   INR 2.8 (A) 06/15/2019   Lab Results  Component Value Date   CHOL  04/30/2010    149        ATP III CLASSIFICATION:  <200     mg/dL   Desirable  05/02/2010  mg/dL   Borderline High  542-706    mg/dL   High          HDL 71 04/30/2010   LDLCALC  04/30/2010    70        Total Cholesterol/HDL:CHD Risk Coronary Heart Disease Risk Table                     Men   Women  1/2 Average Risk   3.4   3.3  Average Risk       5.0   4.4  2 X Average Risk   9.6   7.1  3 X Average Risk  23.4   11.0        Use the calculated Patient Ratio above and the CHD Risk Table to determine the patient's CHD Risk.        ATP III CLASSIFICATION (LDL):  <100     mg/dL   Optimal  05/02/2010  mg/dL   Near or Above  Optimal  130-159  mg/dL   Borderline  937-169  mg/dL   High  >678     mg/dL   Very High   TRIG 42 93/81/0175     GEN- The patient is well appearing, alert and oriented x 3 today.   Head- normocephalic, atraumatic Eyes-  Sclera clear, conjunctiva pink Ears- hearing intact Oropharynx- clear Neck- supple, no JVP Lymph- no cervical lymphadenopathy Lungs- Clear to ausculation bilaterally, normal work of breathing Heart- regular rate and rhythm, no murmurs, rubs or gallops, PMI not laterally displaced GI- soft, NT, ND, + BS Extremities- no clubbing, cyanosis, or edema MS- no significant deformity or atrophy Skin- no  rash or lesion Psych- euthymic mood, full affect Neuro- strength and sensation are intact  EKG- Regular afib vrs rapid atrail flutter at 120 bpm, qrs int 126 ms, qtc 533 ms    Assessment and Plan: 1. Atrial fib/flutter with RVR Persistent  since yesterday  Successful TEE guided cardioversion in December 2020 She feels fatigued but overall tolerating ok at this point Discussed going to there ER but she defers I doubt she could have a cardioversion in the ER as she is on warfarin  She will go home and take 120 mg Cardizem now and then after checking V/S at bedtime if BP is over 100 systolic and HR is till over 100 systolic, she will continue to take bid if BP remains  stable   I will see back on Friday and if remains out of rhythm, I will set up for TEE guided cardioversion   2. CHA2DS2VASc score  of at least 4 Continue  warfarin  3. HTN Stable   F/u as above   Rhonda [name] [facility] [address] [place], [place] [address] [phone]

## 2020-01-19 NOTE — Telephone Encounter (Signed)
Patient was ask to call back with vitals at 4pm. BP at 1:18pm 130/89 HR 119 took dose of Diltiazem at 1:25pm at 2:15pm BP 92/63 HR 134, 3:38pm BP 134/88 HR 108, 3:53pm BP 108/78 HR 78, and at 4pm BP 110/74 HR 119. Informed patient to take BP prior to bed before taking 2nd dose of Diltiazem make sure the top BP number is >100 and HR >100. Also to keep appointment on Friday.

## 2020-01-21 ENCOUNTER — Ambulatory Visit (HOSPITAL_COMMUNITY)
Admission: RE | Admit: 2020-01-21 | Discharge: 2020-01-21 | Disposition: A | Discharge status: Home / Self Care | Payer: Medicare Other | Source: Ambulatory Visit | Attending: Nurse Practitioner | Admitting: Nurse Practitioner

## 2020-01-21 ENCOUNTER — Other Ambulatory Visit: Payer: Self-pay

## 2020-01-21 ENCOUNTER — Encounter (HOSPITAL_COMMUNITY): Payer: Self-pay | Admitting: Nurse Practitioner

## 2020-01-21 ENCOUNTER — Ambulatory Visit (HOSPITAL_COMMUNITY): Payer: Medicare Other | Admitting: Nurse Practitioner

## 2020-01-21 VITALS — BP 180/76 | HR 76 | Ht 64.0 in | Wt 109.4 lb

## 2020-01-21 DIAGNOSIS — I2510 Atherosclerotic heart disease of native coronary artery without angina pectoris: Secondary | ICD-10-CM | POA: Diagnosis not present

## 2020-01-21 DIAGNOSIS — I4892 Unspecified atrial flutter: Secondary | ICD-10-CM | POA: Insufficient documentation

## 2020-01-21 DIAGNOSIS — E785 Hyperlipidemia, unspecified: Secondary | ICD-10-CM | POA: Diagnosis not present

## 2020-01-21 DIAGNOSIS — Z955 Presence of coronary angioplasty implant and graft: Secondary | ICD-10-CM | POA: Diagnosis not present

## 2020-01-21 DIAGNOSIS — Z8249 Family history of ischemic heart disease and other diseases of the circulatory system: Secondary | ICD-10-CM | POA: Insufficient documentation

## 2020-01-21 DIAGNOSIS — I480 Paroxysmal atrial fibrillation: Secondary | ICD-10-CM | POA: Insufficient documentation

## 2020-01-21 DIAGNOSIS — I441 Atrioventricular block, second degree: Secondary | ICD-10-CM | POA: Diagnosis not present

## 2020-01-21 DIAGNOSIS — Z79899 Other long term (current) drug therapy: Secondary | ICD-10-CM | POA: Insufficient documentation

## 2020-01-21 DIAGNOSIS — Z950 Presence of cardiac pacemaker: Secondary | ICD-10-CM | POA: Diagnosis not present

## 2020-01-21 DIAGNOSIS — I10 Essential (primary) hypertension: Secondary | ICD-10-CM | POA: Insufficient documentation

## 2020-01-21 DIAGNOSIS — K219 Gastro-esophageal reflux disease without esophagitis: Secondary | ICD-10-CM | POA: Insufficient documentation

## 2020-01-21 DIAGNOSIS — I447 Left bundle-branch block, unspecified: Secondary | ICD-10-CM | POA: Diagnosis not present

## 2020-01-21 DIAGNOSIS — Z7901 Long term (current) use of anticoagulants: Secondary | ICD-10-CM | POA: Insufficient documentation

## 2020-01-21 DIAGNOSIS — Z888 Allergy status to other drugs, medicaments and biological substances status: Secondary | ICD-10-CM | POA: Insufficient documentation

## 2020-01-21 DIAGNOSIS — Z9841 Cataract extraction status, right eye: Secondary | ICD-10-CM | POA: Diagnosis not present

## 2020-01-21 DIAGNOSIS — Z8719 Personal history of other diseases of the digestive system: Secondary | ICD-10-CM | POA: Insufficient documentation

## 2020-01-21 DIAGNOSIS — Z882 Allergy status to sulfonamides status: Secondary | ICD-10-CM | POA: Diagnosis not present

## 2020-01-21 DIAGNOSIS — Z9842 Cataract extraction status, left eye: Secondary | ICD-10-CM | POA: Insufficient documentation

## 2020-01-21 DIAGNOSIS — H409 Unspecified glaucoma: Secondary | ICD-10-CM | POA: Diagnosis not present

## 2020-01-21 DIAGNOSIS — D6869 Other thrombophilia: Secondary | ICD-10-CM

## 2020-01-21 MED ORDER — DILTIAZEM HCL ER COATED BEADS 120 MG PO CP24: 120.0000 mg | ORAL_CAPSULE | Freq: Two times a day (BID) | ORAL | Status: DC

## 2020-01-21 NOTE — Patient Instructions (Signed)
Continue taking the Cardizem 120mg  by mouth twice daily monitor blood pressure and heart rate

## 2020-01-21 NOTE — Progress Notes (Addendum)
Primary Care Physician: [name], MD Referring Physician: device clinic Cardiologist: Rhonda [name] is a 86 y.o. female with a h/o paroxysmal afib/flutter, PPM with second degree Mobitz II AV block, CAD, HTN that is in the afib clinic for device clinic for pt reporting elevated heart rate since yesterday am. SHe took an extra 30 mg diltiazem to no avail  Controlling her v rates. She was concerned as the drug store replaced her Nicanor Alcon xt with diltiazem ER. She felt it may not be working as well, but the pharmacy reports that brand diltiazem is not being made any longer.  ekg shows atrail flutter at 130 bpm. BP is 140/88.   She had  atrial flutter with RVR , which she was not tolerating well in December,  so she was set up for TEE guided cardioversion( on wafarin and it was felt that she would not do well waiting the 4 weeks for therapeutic INR's), which was successful. She continued in SR until  yesterday. She feels fatigued today but otherwise does not appear unstable.   F/u in afib clinic 7/23. Ekg today  shows  NSR at 76 bpm. She called yesterday and felt she was back in rhythm on the bid dose of cardizem 120 mg. She feels improved.  Today, she denies symptoms of palpitations, chest pain, shortness of breath, orthopnea, PND, lower extremity edema, dizziness, presyncope, syncope, or neurologic sequela. The patient is tolerating medications without difficulties and is otherwise without complaint today.   Past Medical History:  Diagnosis Date  . Anemia   . Bilateral carotid bruits   . Coronary disease    s/p PTCA 1990s  . GERD (gastroesophageal reflux disease)    takes Protonix daily  . GI bleed    previously with pradaxa  . Glaucoma    uses Eye Drops daily  . H/O hiatal hernia   . Hyperlipidemia    takes Atorvastatin daily  . Hypertension    takes Azor and Apresoline daily  . LBBB (left bundle branch block)   . Pacemaker    MDT Advisa May 2013, Dr. Salena Saner  . PAF  (paroxysmal atrial fibrillation) [facility])    PVI ablation May 2013, takes Coumadin daily  . Sore throat    since anesthesia in Feb 2015 AND ALLEGERIES   Past Surgical History:  Procedure Laterality Date  . atrial fibrillation ablation  11/12/11   PVI by Dr Johney Frame  . ATRIAL FIBRILLATION ABLATION N/A 11/12/2011   Procedure: ATRIAL FIBRILLATION ABLATION;  Surgeon: [name], MD;  Location: [facility] CATH LAB;  Service: Cardiovascular;  Laterality: N/A;  . CARDIOVERSION N/A 05/26/2019   Procedure: CARDIOVERSION;  Surgeon: Chilton Si, MD;  Location: [facility] ENDOSCOPY;  Service: Cardiovascular;  Laterality: N/A;  . CATARACT EXTRACTION W/ INTRAOCULAR LENS  IMPLANT, BILATERAL    . COLONOSCOPY    . CORONARY ANGIOPLASTY  1990s   by Dr Aleen Campi  . ESOPHAGOGASTRECTOMY    . EYE SURGERY     GLAUCOMA SURG LT EYE  . KYPHOPLASTY N/A 08/09/2013   Procedure: LUMBAR TWO KYPHOPLASTY;  Surgeon: Barnett Abu, MD;  Location: [facility] NEURO ORS;  Service: Neurosurgery;  Laterality: N/A;  L2 Kyphoplasty  . KYPHOPLASTY N/A 09/07/2013   Procedure: L1 KYPHOPLASTY;  Surgeon: Barnett Abu, MD;  Location: [facility] NEURO ORS;  Service: Neurosurgery;  Laterality: N/A;  . KYPHOPLASTY N/A 10/04/2013   Procedure: T12 KYPHOPLASTY;  Surgeon: Barnett Abu, MD;  Location: [facility] NEURO ORS;  Service: Neurosurgery;  Laterality: N/A;  T12 KYPHOPLASTY  . PACEMAKER INSERTION  11/13/11   MDT implanted by Dr Royann Shivers for 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 observed on EP study  . PERMANENT PACEMAKER INSERTION N/A 11/13/2011   Procedure: PERMANENT PACEMAKER INSERTION;  Surgeon: Thurmon Fair, MD;  Location: [facility] CATH LAB;  Service: Cardiovascular;  Laterality: N/A;  . TEE WITHOUT CARDIOVERSION  11/11/2011   Procedure: TRANSESOPHAGEAL ECHOCARDIOGRAM (TEE);  Surgeon: Chrystie Nose, MD;  Location: [facility] ENDOSCOPY;  Service: Cardiovascular;  Laterality: N/A;  . TEE WITHOUT CARDIOVERSION N/A 05/26/2019   Procedure: TRANSESOPHAGEAL ECHOCARDIOGRAM (TEE);  Surgeon:  Chilton Si, MD;  Location: [facility] ENDOSCOPY;  Service: Cardiovascular;  Laterality: N/A;  . TEMPORARY PACEMAKER INSERTION  11/12/2011   Procedure: TEMPORARY PACEMAKER INSERTION;  Surgeon: [name], MD;  Location: [facility] CATH LAB;  Service: Cardiovascular;;    Current Outpatient Medications  Medication Sig Dispense Refill  . atorvastatin (LIPITOR) 40 MG tablet Take 40 mg by mouth every evening.   2  . brimonidine (ALPHAGAN P) 0.1 % SOLN Instill 3 drops in the right eye daily    . Cholecalciferol (VITAMIN D3) 50 MCG (2000 UT) TABS Take 2,000 Units by mouth daily with lunch.    . denosumab (PROLIA) 60 MG/ML SOLN injection Inject 60 mg into the skin every 6 (six) months. Administer in upper arm, thigh, or abdomen    . diltiazem (CARDIZEM CD) 120 MG 24 hr capsule Take 1 capsule (120 mg total) by mouth daily. 90 capsule 3  . diltiazem (CARDIZEM) 30 MG tablet take 1 tablet every 4 hours AS NEEDED for heart rate >100 as long as blood pressure >100. 45 tablet 3  . dorzolamide (TRUSOPT) 2 % ophthalmic solution Place 1 drop into the right eye 3 (three) times daily.     . furosemide (LASIX) 20 MG tablet Take 60 mg by mouth daily.    . Latanoprostene Bunod (VYZULTA) 0.024 % SOLN Place 1 drop into the right eye at bedtime.     . nitroGLYCERIN (NITROSTAT) 0.4 MG SL tablet Place 0.4 mg under the tongue every 5 (five) minutes as needed. For chest pain.    Marland Kitchen warfarin (COUMADIN) 5 MG tablet Take 2.5-5 mg by mouth See admin instructions. Take 1 tablet (5 mg) by mouth on Mondays, Wednesdays, & Fridays. Take 0.5 tablet (2.5 mg) by mouth on Sundays, Tuesdays, Thursdays, & Saturdays.     No current facility-administered medications for this encounter.    Allergies  Allergen Reactions  . Azor [Amlodipine-Olmesartan] Other (See Comments)    Higher doses cause ankles to swell  . Beta Adrenergic Blockers Other (See Comments)    Bradycardia.  . Norvasc [Amlodipine Besylate] Other (See Comments)    edema  .  Pradaxa [Dabigatran Etexilate Mesylate] Other (See Comments)    GI Bleed  . Questran [Cholestyramine] Other (See Comments)    Constipation  . Sulfa Drugs Cross Reactors Hives    Social History   Socioeconomic History  . Marital status: Married    Spouse name: Not on file  . Number of children: Not on file  . Years of education: Not on file  . Highest education level: Not on file  Occupational History  . Not on file  Tobacco Use  . Smoking status: Never Smoker  . Smokeless tobacco: Never Used  Vaping Use  . Vaping Use: Never used  Substance and Sexual Activity  . Alcohol use: No  . Drug use: No  . Sexual activity: Not Currently    Birth control/protection:  Post-menopausal  Other Topics Concern  . Not on file  Social History Narrative   Pt lives in [place].  Retired form [employer].   Attends [employer]   Social Determinants of Health   Financial Resource Strain:   . Difficulty of Paying Living Expenses:   Food Insecurity:   . Worried About Programme researcher, broadcasting/film/video in the Last Year:   . Barista in the Last Year:   Transportation Needs:   . Freight forwarder (Medical):   Marland Kitchen Lack of Transportation (Non-Medical):   Physical Activity:   . Days of Exercise per Week:   . Minutes of Exercise per Session:   Stress:   . Feeling of Stress :   Social Connections:   . Frequency of Communication with Friends and Family:   . Frequency of Social Gatherings with Friends and Family:   . Attends Religious Services:   . Active Member of Clubs or Organizations:   . Attends Banker Meetings:   Marland Kitchen Marital Status:   Intimate Partner Violence:   . Fear of Current or Ex-Partner:   . Emotionally Abused:   Marland Kitchen Physically Abused:   . Sexually Abused:     Family History  Problem Relation Age of Onset  . Heart disease Mother   . Heart disease Father   . Heart disease Brother   . Cancer Maternal Grandmother   . Heart disease Maternal Grandfather   .  Pneumonia Paternal Grandmother   . Pneumonia Paternal Grandfather   . Heart disease Brother   . Heart disease Brother   . Pneumonia Sister     ROS- All systems are reviewed and negative except as per the HPI above  Physical Exam: There were no vitals filed for this visit. Wt Readings from Last 3 Encounters:  01/19/20 47.4 kg  08/30/19 47.6 kg  06/08/19 47 kg    Labs: Lab Results  Component Value Date   NA 139 05/24/2019   K 4.2 05/24/2019   CL 106 05/24/2019   CO2 27 05/24/2019   GLUCOSE 103 (H) 05/24/2019   BUN 19 05/24/2019   CREATININE 0.61 05/24/2019   CALCIUM 10.1 05/24/2019   MG 2.2 09/13/2010   Lab Results  Component Value Date   INR 2.8 (A) 06/15/2019   Lab Results  Component Value Date   CHOL  04/30/2010    149        ATP III CLASSIFICATION:  <200     mg/dL   Desirable  655-374  mg/dL   Borderline High  >=827    mg/dL   High          HDL 71 04/30/2010   LDLCALC  04/30/2010    70        Total Cholesterol/HDL:CHD Risk Coronary Heart Disease Risk Table                     Men   Women  1/2 Average Risk   3.4   3.3  Average Risk       5.0   4.4  2 X Average Risk   9.6   7.1  3 X Average Risk  23.4   11.0        Use the calculated Patient Ratio above and the CHD Risk Table to determine the patient's CHD Risk.        ATP III CLASSIFICATION (LDL):  <100     mg/dL   Optimal  078-675  mg/dL   Near or Above                    Optimal  130-159  mg/dL   Borderline  915-056  mg/dL   High  >979     mg/dL   Very High   TRIG 42 48/07/6551     GEN- The patient is well appearing, alert and oriented x 3 today.   Head- normocephalic, atraumatic Eyes-  Sclera clear, conjunctiva pink Ears- hearing intact Oropharynx- clear Neck- supple, no JVP Lymph- no cervical lymphadenopathy Lungs- Clear to ausculation bilaterally, normal work of breathing Heart- regular rate and rhythm, no murmurs, rubs or gallops, PMI not laterally displaced GI- soft, NT, ND, +  BS Extremities- no clubbing, cyanosis, or edema MS- no significant deformity or atrophy Skin- no rash or lesion Psych- euthymic mood, full affect Neuro- strength and sensation are intact  EKG- NSR at 76 bpm, pr int 170 ms, qrs int 132, qtc 470 ms   Assessment and Plan: 1. Atrial fib/flutter with RVR Persistent  since early in the week Increased Cardizem to 120 mg bid and she was able to self convert  Will  continue Cardizem at 120 mg bid   2. CHA2DS2VASc score  of at least 4 Continue  warfarin  3. HTN Elevated today  BP's at home in normal range   F/u in one week   Rhonda [name] [facility] [address] [place], [place] [address] [phone]

## 2020-01-28 ENCOUNTER — Ambulatory Visit (HOSPITAL_COMMUNITY)
Admission: RE | Admit: 2020-01-28 | Discharge: 2020-01-28 | Disposition: A | Discharge status: Home / Self Care | Payer: Medicare Other | Source: Ambulatory Visit | Attending: Nurse Practitioner | Admitting: Nurse Practitioner

## 2020-01-28 ENCOUNTER — Encounter (HOSPITAL_COMMUNITY): Payer: Self-pay | Admitting: Nurse Practitioner

## 2020-01-28 ENCOUNTER — Other Ambulatory Visit: Payer: Self-pay

## 2020-01-28 VITALS — BP 170/70 | HR 71 | Wt 106.8 lb

## 2020-01-28 DIAGNOSIS — I10 Essential (primary) hypertension: Secondary | ICD-10-CM | POA: Insufficient documentation

## 2020-01-28 DIAGNOSIS — D6869 Other thrombophilia: Secondary | ICD-10-CM | POA: Diagnosis not present

## 2020-01-28 DIAGNOSIS — I4891 Unspecified atrial fibrillation: Secondary | ICD-10-CM | POA: Diagnosis not present

## 2020-01-28 DIAGNOSIS — I4892 Unspecified atrial flutter: Secondary | ICD-10-CM | POA: Insufficient documentation

## 2020-01-28 DIAGNOSIS — I447 Left bundle-branch block, unspecified: Secondary | ICD-10-CM | POA: Diagnosis not present

## 2020-01-28 MED ORDER — DILTIAZEM HCL ER COATED BEADS 120 MG PO CP24: 120.0000 mg | ORAL_CAPSULE | Freq: Two times a day (BID) | ORAL | 6 refills | Status: DC

## 2020-01-28 NOTE — Progress Notes (Addendum)
Primary Care Physician: [name], MD Referring Physician: device clinic Cardiologist: Rhonda [name] is a 86 y.o. female with a h/o paroxysmal afib/flutter, PPM with second degree Mobitz II AV block, CAD, HTN that is in the afib clinic for device clinic for pt reporting elevated heart rate since yesterday am. SHe took an extra 30 mg diltiazem to no avail  Controlling her v rates. She was concerned as the drug store replaced her Nicanor Alcon xt with diltiazem ER. She felt it may not be working as well, but the pharmacy reports that brand diltiazem is not being made any longer.  ekg shows atrail flutter at 130 bpm. BP is 140/88.   She had  atrial flutter with RVR , which she was not tolerating well in December,  so she was set up for TEE guided cardioversion( on wafarin and it was felt that she would not do well waiting the 4 weeks for therapeutic INR's), which was successful. She continued in SR until  yesterday. She feels fatigued today but otherwise does not appear unstable.   F/u in afib clinic 7/23. Ekg today  shows  NSR at 76 bpm. She called yesterday and felt she was back in rhythm on the bid dose of cardizem 120 mg. She feels improved.  F/u 7/31. She remains in SR and feels improved on diltazem 120 mg bid. BP/HR's stable.   Today, she denies symptoms of palpitations, chest pain, shortness of breath, orthopnea, PND, lower extremity edema, dizziness, presyncope, syncope, or neurologic sequela. The patient is tolerating medications without difficulties and is otherwise without complaint today.   Past Medical History:  Diagnosis Date  . Anemia   . Bilateral carotid bruits   . Coronary disease    s/p PTCA 1990s  . GERD (gastroesophageal reflux disease)    takes Protonix daily  . GI bleed    previously with pradaxa  . Glaucoma    uses Eye Drops daily  . H/O hiatal hernia   . Hyperlipidemia    takes Atorvastatin daily  . Hypertension    takes Azor and Apresoline daily   . LBBB (left bundle branch block)   . Pacemaker    MDT Advisa May 2013, Dr. Salena Saner  . PAF (paroxysmal atrial fibrillation) [facility])    PVI ablation May 2013, takes Coumadin daily  . Sore throat    since anesthesia in Feb 2015 AND ALLEGERIES   Past Surgical History:  Procedure Laterality Date  . atrial fibrillation ablation  11/12/11   PVI by Dr Johney Frame  . ATRIAL FIBRILLATION ABLATION N/A 11/12/2011   Procedure: ATRIAL FIBRILLATION ABLATION;  Surgeon: [name], MD;  Location: [facility] CATH LAB;  Service: Cardiovascular;  Laterality: N/A;  . CARDIOVERSION N/A 05/26/2019   Procedure: CARDIOVERSION;  Surgeon: Chilton Si, MD;  Location: [facility] ENDOSCOPY;  Service: Cardiovascular;  Laterality: N/A;  . CATARACT EXTRACTION W/ INTRAOCULAR LENS  IMPLANT, BILATERAL    . COLONOSCOPY    . CORONARY ANGIOPLASTY  1990s   by Dr Aleen Campi  . ESOPHAGOGASTRECTOMY    . EYE SURGERY     GLAUCOMA SURG LT EYE  . KYPHOPLASTY N/A 08/09/2013   Procedure: LUMBAR TWO KYPHOPLASTY;  Surgeon: Barnett Abu, MD;  Location: [facility] NEURO ORS;  Service: Neurosurgery;  Laterality: N/A;  L2 Kyphoplasty  . KYPHOPLASTY N/A 09/07/2013   Procedure: L1 KYPHOPLASTY;  Surgeon: Barnett Abu, MD;  Location: [facility] NEURO ORS;  Service: Neurosurgery;  Laterality: N/A;  . KYPHOPLASTY N/A 10/04/2013   Procedure:  T12 KYPHOPLASTY;  Surgeon: Barnett Abu, MD;  Location: [facility] NEURO ORS;  Service: Neurosurgery;  Laterality: N/A;  T12 KYPHOPLASTY  . PACEMAKER INSERTION  11/13/11   MDT implanted by Dr Royann Shivers for 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 observed on EP study  . PERMANENT PACEMAKER INSERTION N/A 11/13/2011   Procedure: PERMANENT PACEMAKER INSERTION;  Surgeon: Thurmon Fair, MD;  Location: [facility] CATH LAB;  Service: Cardiovascular;  Laterality: N/A;  . TEE WITHOUT CARDIOVERSION  11/11/2011   Procedure: TRANSESOPHAGEAL ECHOCARDIOGRAM (TEE);  Surgeon: Chrystie Nose, MD;  Location: [facility] ENDOSCOPY;  Service: Cardiovascular;  Laterality: N/A;  . TEE WITHOUT  CARDIOVERSION N/A 05/26/2019   Procedure: TRANSESOPHAGEAL ECHOCARDIOGRAM (TEE);  Surgeon: Chilton Si, MD;  Location: [facility] ENDOSCOPY;  Service: Cardiovascular;  Laterality: N/A;  . TEMPORARY PACEMAKER INSERTION  11/12/2011   Procedure: TEMPORARY PACEMAKER INSERTION;  Surgeon: [name], MD;  Location: [facility] CATH LAB;  Service: Cardiovascular;;    Current Outpatient Medications  Medication Sig Dispense Refill  . atorvastatin (LIPITOR) 40 MG tablet Take 40 mg by mouth every evening.   2  . brimonidine (ALPHAGAN P) 0.1 % SOLN Instill 3 drops in the right eye daily    . Cholecalciferol (VITAMIN D3) 50 MCG (2000 UT) TABS Take 2,000 Units by mouth daily with lunch.    . denosumab (PROLIA) 60 MG/ML SOLN injection Inject 60 mg into the skin every 6 (six) months. Administer in upper arm, thigh, or abdomen    . diltiazem (CARDIZEM CD) 120 MG 24 hr capsule Take 1 capsule (120 mg total) by mouth in the morning and at bedtime.    Marland Kitchen diltiazem (CARDIZEM) 30 MG tablet take 1 tablet every 4 hours AS NEEDED for heart rate >100 as long as blood pressure >100. (Patient not taking: Reported on 01/21/2020) 45 tablet 3  . dorzolamide (TRUSOPT) 2 % ophthalmic solution Place 1 drop into the right eye 3 (three) times daily.     . furosemide (LASIX) 20 MG tablet Take 60 mg by mouth daily.    Marland Kitchen ketotifen (ZADITOR) 0.025 % ophthalmic solution Place 1 drop into the left eye 2 (two) times daily.    . Latanoprostene Bunod (VYZULTA) 0.024 % SOLN Place 1 drop into the right eye at bedtime.     . nitroGLYCERIN (NITROSTAT) 0.4 MG SL tablet Place 0.4 mg under the tongue every 5 (five) minutes as needed. For chest pain.    Marland Kitchen warfarin (COUMADIN) 5 MG tablet Take 2.5-5 mg by mouth See admin instructions. Take 1 tablet (5 mg) by mouth on Mondays, Wednesdays, & Fridays. Take 0.5 tablet (2.5 mg) by mouth on Sundays, Tuesdays, Thursdays, & Saturdays.     No current facility-administered medications for this encounter.    Allergies   Allergen Reactions  . Azor [Amlodipine-Olmesartan] Other (See Comments)    Higher doses cause ankles to swell  . Beta Adrenergic Blockers Other (See Comments)    Bradycardia.  . Norvasc [Amlodipine Besylate] Other (See Comments)    edema  . Pradaxa [Dabigatran Etexilate Mesylate] Other (See Comments)    GI Bleed  . Questran [Cholestyramine] Other (See Comments)    Constipation  . Sulfa Drugs Cross Reactors Hives    Social History   Socioeconomic History  . Marital status: Married    Spouse name: Not on file  . Number of children: Not on file  . Years of education: Not on file  . Highest education level: Not on file  Occupational History  . Not on file  Tobacco Use  . Smoking status: Never Smoker  . Smokeless tobacco: Never Used  Vaping Use  . Vaping Use: Never used  Substance and Sexual Activity  . Alcohol use: No  . Drug use: No  . Sexual activity: Not Currently    Birth control/protection: Post-menopausal  Other Topics Concern  . Not on file  Social History Narrative   Pt lives in [place].  Retired form [employer].   Attends [employer]   Social Determinants of Health   Financial Resource Strain:   . Difficulty of Paying Living Expenses:   Food Insecurity:   . Worried About Programme researcher, broadcasting/film/video in the Last Year:   . Barista in the Last Year:   Transportation Needs:   . Freight forwarder (Medical):   Marland Kitchen Lack of Transportation (Non-Medical):   Physical Activity:   . Days of Exercise per Week:   . Minutes of Exercise per Session:   Stress:   . Feeling of Stress :   Social Connections:   . Frequency of Communication with Friends and Family:   . Frequency of Social Gatherings with Friends and Family:   . Attends Religious Services:   . Active Member of Clubs or Organizations:   . Attends Banker Meetings:   Marland Kitchen Marital Status:   Intimate Partner Violence:   . Fear of Current or Ex-Partner:   . Emotionally Abused:   Marland Kitchen  Physically Abused:   . Sexually Abused:     Family History  Problem Relation Age of Onset  . Heart disease Mother   . Heart disease Father   . Heart disease Brother   . Cancer Maternal Grandmother   . Heart disease Maternal Grandfather   . Pneumonia Paternal Grandmother   . Pneumonia Paternal Grandfather   . Heart disease Brother   . Heart disease Brother   . Pneumonia Sister     ROS- All systems are reviewed and negative except as per the HPI above  Physical Exam: Vitals:   01/28/20 1155  BP: (!) 170/70  Pulse: 71  Weight: 48.4 kg   Wt Readings from Last 3 Encounters:  01/28/20 48.4 kg  01/21/20 49.6 kg  01/19/20 47.4 kg    Labs: Lab Results  Component Value Date   NA 139 05/24/2019   K 4.2 05/24/2019   CL 106 05/24/2019   CO2 27 05/24/2019   GLUCOSE 103 (H) 05/24/2019   BUN 19 05/24/2019   CREATININE 0.61 05/24/2019   CALCIUM 10.1 05/24/2019   MG 2.2 09/13/2010   Lab Results  Component Value Date   INR 2.8 (A) 06/15/2019   Lab Results  Component Value Date   CHOL  04/30/2010    149        ATP III CLASSIFICATION:  <200     mg/dL   Desirable  294-765  mg/dL   Borderline High  >=465    mg/dL   High          HDL 71 04/30/2010   LDLCALC  04/30/2010    70        Total Cholesterol/HDL:CHD Risk Coronary Heart Disease Risk Table                     Men   Women  1/2 Average Risk   3.4   3.3  Average Risk       5.0   4.4  2 X Average Risk   9.6  7.1  3 X Average Risk  23.4   11.0        Use the calculated Patient Ratio above and the CHD Risk Table to determine the patient's CHD Risk.        ATP III CLASSIFICATION (LDL):  <100     mg/dL   Optimal  353-614  mg/dL   Near or Above                    Optimal  130-159  mg/dL   Borderline  431-540  mg/dL   High  >086     mg/dL   Very High   TRIG 42 76/19/5093     GEN- The patient is well appearing, alert and oriented x 3 today.   Head- normocephalic, atraumatic Eyes-  Sclera clear, conjunctiva  pink Ears- hearing intact Oropharynx- clear Neck- supple, no JVP Lymph- no cervical lymphadenopathy Lungs- Clear to ausculation bilaterally, normal work of breathing Heart- regular rate and rhythm, no murmurs, rubs or gallops, PMI not laterally displaced GI- soft, NT, ND, + BS Extremities- no clubbing, cyanosis, or edema MS- no significant deformity or atrophy Skin- no rash or lesion Psych- euthymic mood, full affect Neuro- strength and sensation are intact  EKG- NSR at 71 bpm, pr int 168 ms, qrs int 128, qtc 454 ms   Assessment and Plan: 1. Atrial fib/flutter with RVR Persistent  since early in the week Increased Cardizem to 120 mg bid and she was able to self convert  Will  continue Cardizem at 120 mg bid   2. CHA2DS2VASc score  of at least 4 Continue  warfarin  3. HTN Elevated today  BP's at home in normal range, reviewed readings today and well controlled   F/u with Dr. Johney Frame in the fall and as needed here  [name] [address] [place], [place] [address] [phone]

## 2020-02-01 DIAGNOSIS — I4891 Unspecified atrial fibrillation: Secondary | ICD-10-CM | POA: Diagnosis not present

## 2020-02-01 DIAGNOSIS — Z7901 Long term (current) use of anticoagulants: Secondary | ICD-10-CM | POA: Diagnosis not present

## 2020-02-28 DIAGNOSIS — Z1231 Encounter for screening mammogram for malignant neoplasm of breast: Secondary | ICD-10-CM | POA: Diagnosis not present

## 2020-02-29 DIAGNOSIS — Z7901 Long term (current) use of anticoagulants: Secondary | ICD-10-CM | POA: Diagnosis not present

## 2020-02-29 DIAGNOSIS — I4891 Unspecified atrial fibrillation: Secondary | ICD-10-CM | POA: Diagnosis not present

## 2020-03-23 ENCOUNTER — Ambulatory Visit (INDEPENDENT_AMBULATORY_CARE_PROVIDER_SITE_OTHER): Payer: Medicare Other | Admitting: Emergency Medicine

## 2020-03-23 DIAGNOSIS — I441 Atrioventricular block, second degree: Secondary | ICD-10-CM | POA: Diagnosis not present

## 2020-03-23 LAB — CUP PACEART REMOTE DEVICE CHECK
Battery Impedance: 1251 Ohm
Battery Remaining Longevity: 55 mo
Battery Voltage: 2.76 V
Brady Statistic AP VP Percent: 0 %
Brady Statistic AP VS Percent: 64 %
Brady Statistic AS VP Percent: 0 %
Brady Statistic AS VS Percent: 36 %
Date Time Interrogation Session: 20210923111453
Implantable Lead Implant Date: 20130515
Implantable Lead Implant Date: 20130515
Implantable Lead Location: 753859
Implantable Lead Location: 753860
Implantable Lead Model: 5076
Implantable Lead Model: 5076
Implantable Pulse Generator Implant Date: 20130515
Lead Channel Impedance Value: 436 Ohm
Lead Channel Impedance Value: 730 Ohm
Lead Channel Pacing Threshold Amplitude: 0.5 V
Lead Channel Pacing Threshold Amplitude: 0.75 V
Lead Channel Pacing Threshold Pulse Width: 0.4 ms
Lead Channel Pacing Threshold Pulse Width: 0.4 ms
Lead Channel Setting Pacing Amplitude: 2 V
Lead Channel Setting Pacing Amplitude: 2.5 V
Lead Channel Setting Pacing Pulse Width: 0.4 ms
Lead Channel Setting Sensing Sensitivity: 5.6 mV

## 2020-03-28 DIAGNOSIS — Z23 Encounter for immunization: Secondary | ICD-10-CM | POA: Diagnosis not present

## 2020-03-28 DIAGNOSIS — Z7901 Long term (current) use of anticoagulants: Secondary | ICD-10-CM | POA: Diagnosis not present

## 2020-03-28 DIAGNOSIS — I4891 Unspecified atrial fibrillation: Secondary | ICD-10-CM | POA: Diagnosis not present

## 2020-03-28 NOTE — Progress Notes (Signed)
Remote pacemaker transmission.   

## 2020-05-02 DIAGNOSIS — Z7901 Long term (current) use of anticoagulants: Secondary | ICD-10-CM | POA: Diagnosis not present

## 2020-05-02 DIAGNOSIS — I4891 Unspecified atrial fibrillation: Secondary | ICD-10-CM | POA: Diagnosis not present

## 2020-05-11 DIAGNOSIS — H02886 Meibomian gland dysfunction of left eye, unspecified eyelid: Secondary | ICD-10-CM | POA: Diagnosis not present

## 2020-05-11 DIAGNOSIS — H401112 Primary open-angle glaucoma, right eye, moderate stage: Secondary | ICD-10-CM | POA: Diagnosis not present

## 2020-05-11 DIAGNOSIS — H401123 Primary open-angle glaucoma, left eye, severe stage: Secondary | ICD-10-CM | POA: Diagnosis not present

## 2020-05-11 DIAGNOSIS — H02883 Meibomian gland dysfunction of right eye, unspecified eyelid: Secondary | ICD-10-CM | POA: Diagnosis not present

## 2020-05-30 DIAGNOSIS — I10 Essential (primary) hypertension: Secondary | ICD-10-CM | POA: Diagnosis not present

## 2020-05-30 DIAGNOSIS — Z7901 Long term (current) use of anticoagulants: Secondary | ICD-10-CM | POA: Diagnosis not present

## 2020-05-30 DIAGNOSIS — I4891 Unspecified atrial fibrillation: Secondary | ICD-10-CM | POA: Diagnosis not present

## 2020-06-12 DIAGNOSIS — I4891 Unspecified atrial fibrillation: Secondary | ICD-10-CM | POA: Diagnosis not present

## 2020-06-12 DIAGNOSIS — Z7901 Long term (current) use of anticoagulants: Secondary | ICD-10-CM | POA: Diagnosis not present

## 2020-06-12 DIAGNOSIS — I10 Essential (primary) hypertension: Secondary | ICD-10-CM | POA: Diagnosis not present

## 2020-06-15 DIAGNOSIS — I779 Disorder of arteries and arterioles, unspecified: Secondary | ICD-10-CM | POA: Diagnosis not present

## 2020-06-15 DIAGNOSIS — I4891 Unspecified atrial fibrillation: Secondary | ICD-10-CM | POA: Diagnosis not present

## 2020-06-15 DIAGNOSIS — K219 Gastro-esophageal reflux disease without esophagitis: Secondary | ICD-10-CM | POA: Diagnosis not present

## 2020-06-15 DIAGNOSIS — Z0000 Encounter for general adult medical examination without abnormal findings: Secondary | ICD-10-CM | POA: Diagnosis not present

## 2020-06-22 ENCOUNTER — Ambulatory Visit (INDEPENDENT_AMBULATORY_CARE_PROVIDER_SITE_OTHER): Payer: Medicare Other

## 2020-06-22 DIAGNOSIS — I441 Atrioventricular block, second degree: Secondary | ICD-10-CM

## 2020-06-22 LAB — CUP PACEART REMOTE DEVICE CHECK
Battery Impedance: 1303 Ohm
Battery Remaining Longevity: 53 mo
Battery Voltage: 2.76 V
Brady Statistic AP VP Percent: 0 %
Brady Statistic AP VS Percent: 67 %
Brady Statistic AS VP Percent: 0 %
Brady Statistic AS VS Percent: 33 %
Date Time Interrogation Session: 20211223095127
Implantable Lead Implant Date: 20130515
Implantable Lead Implant Date: 20130515
Implantable Lead Location: 753859
Implantable Lead Location: 753860
Implantable Lead Model: 5076
Implantable Lead Model: 5076
Implantable Pulse Generator Implant Date: 20130515
Lead Channel Impedance Value: 424 Ohm
Lead Channel Impedance Value: 735 Ohm
Lead Channel Pacing Threshold Amplitude: 0.5 V
Lead Channel Pacing Threshold Amplitude: 1 V
Lead Channel Pacing Threshold Pulse Width: 0.4 ms
Lead Channel Pacing Threshold Pulse Width: 0.4 ms
Lead Channel Setting Pacing Amplitude: 2 V
Lead Channel Setting Pacing Amplitude: 2.5 V
Lead Channel Setting Pacing Pulse Width: 0.4 ms
Lead Channel Setting Sensing Sensitivity: 5.6 mV

## 2020-06-27 DIAGNOSIS — M810 Age-related osteoporosis without current pathological fracture: Secondary | ICD-10-CM | POA: Diagnosis not present

## 2020-07-05 NOTE — Progress Notes (Signed)
Remote pacemaker transmission.   

## 2020-07-11 DIAGNOSIS — I10 Essential (primary) hypertension: Secondary | ICD-10-CM | POA: Diagnosis not present

## 2020-07-11 DIAGNOSIS — Z7901 Long term (current) use of anticoagulants: Secondary | ICD-10-CM | POA: Diagnosis not present

## 2020-07-11 DIAGNOSIS — I4891 Unspecified atrial fibrillation: Secondary | ICD-10-CM | POA: Diagnosis not present

## 2020-07-26 DIAGNOSIS — Z7901 Long term (current) use of anticoagulants: Secondary | ICD-10-CM | POA: Diagnosis not present

## 2020-07-26 DIAGNOSIS — I4891 Unspecified atrial fibrillation: Secondary | ICD-10-CM | POA: Diagnosis not present

## 2020-07-26 DIAGNOSIS — I10 Essential (primary) hypertension: Secondary | ICD-10-CM | POA: Diagnosis not present

## 2020-08-08 DIAGNOSIS — Z7901 Long term (current) use of anticoagulants: Secondary | ICD-10-CM | POA: Diagnosis not present

## 2020-08-08 DIAGNOSIS — I10 Essential (primary) hypertension: Secondary | ICD-10-CM | POA: Diagnosis not present

## 2020-08-08 DIAGNOSIS — I4891 Unspecified atrial fibrillation: Secondary | ICD-10-CM | POA: Diagnosis not present

## 2020-08-28 DIAGNOSIS — L820 Inflamed seborrheic keratosis: Secondary | ICD-10-CM | POA: Diagnosis not present

## 2020-08-28 DIAGNOSIS — L814 Other melanin hyperpigmentation: Secondary | ICD-10-CM | POA: Diagnosis not present

## 2020-08-28 DIAGNOSIS — L821 Other seborrheic keratosis: Secondary | ICD-10-CM | POA: Diagnosis not present

## 2020-08-28 DIAGNOSIS — D225 Melanocytic nevi of trunk: Secondary | ICD-10-CM | POA: Diagnosis not present

## 2020-08-29 DIAGNOSIS — I4891 Unspecified atrial fibrillation: Secondary | ICD-10-CM | POA: Diagnosis not present

## 2020-08-29 DIAGNOSIS — Z7901 Long term (current) use of anticoagulants: Secondary | ICD-10-CM | POA: Diagnosis not present

## 2020-08-29 DIAGNOSIS — I10 Essential (primary) hypertension: Secondary | ICD-10-CM | POA: Diagnosis not present

## 2020-09-07 ENCOUNTER — Ambulatory Visit: Payer: Medicare Other | Admitting: Nurse Practitioner

## 2020-09-07 ENCOUNTER — Encounter: Payer: Self-pay | Admitting: Nurse Practitioner

## 2020-09-07 ENCOUNTER — Other Ambulatory Visit: Payer: Self-pay

## 2020-09-07 VITALS — BP 180/90 | HR 73 | Ht 64.5 in | Wt 109.6 lb

## 2020-09-07 DIAGNOSIS — I10 Essential (primary) hypertension: Secondary | ICD-10-CM | POA: Diagnosis not present

## 2020-09-07 DIAGNOSIS — I4819 Other persistent atrial fibrillation: Secondary | ICD-10-CM | POA: Diagnosis not present

## 2020-09-07 DIAGNOSIS — D6869 Other thrombophilia: Secondary | ICD-10-CM

## 2020-09-07 DIAGNOSIS — I441 Atrioventricular block, second degree: Secondary | ICD-10-CM | POA: Diagnosis not present

## 2020-09-07 MED ORDER — DILTIAZEM HCL ER COATED BEADS 120 MG PO CP24: 120.0000 mg | ORAL_CAPSULE | Freq: Two times a day (BID) | ORAL | 3 refills | Status: DC

## 2020-09-07 NOTE — Progress Notes (Signed)
Electrophysiology Office Note Date: 09/07/2020  ID:  Rhonda [name], Rhonda [name] [birth year], MRN [id number]  PCP: [name], MD Electrophysiologist: Johney Frame  CC: Pacemaker follow-up  Rhonda [name] is a 87 y.o. female seen today for Dr Johney Frame.  She presents today for routine electrophysiology followup.  Since last being seen in our clinic, the patient reports doing very well.  She denies chest pain, palpitations, dyspnea, PND, orthopnea, nausea, vomiting, dizziness, syncope, edema, weight gain, or early satiety.  Device History: MDT dual chamber PPM implanted 2013 for SSS   Past Medical History:  Diagnosis Date  . Anemia   . Bilateral carotid bruits   . Coronary disease    s/p PTCA 1990s  . GERD (gastroesophageal reflux disease)    takes Protonix daily  . GI bleed    previously with pradaxa  . Glaucoma    uses Eye Drops daily  . H/O hiatal hernia   . Hyperlipidemia    takes Atorvastatin daily  . Hypertension    takes Azor and Apresoline daily  . LBBB (left bundle branch block)   . Pacemaker    MDT Advisa May 2013, Dr. Salena Saner  . PAF (paroxysmal atrial fibrillation) [facility])    PVI ablation May 2013, takes Coumadin daily  . Sore throat    since anesthesia in Feb 2015 AND ALLEGERIES   Past Surgical History:  Procedure Laterality Date  . atrial fibrillation ablation  11/12/11   PVI by Dr Johney Frame  . ATRIAL FIBRILLATION ABLATION N/A 11/12/2011   Procedure: ATRIAL FIBRILLATION ABLATION;  Surgeon: [name], MD;  Location: [facility] CATH LAB;  Service: Cardiovascular;  Laterality: N/A;  . CARDIOVERSION N/A 05/26/2019   Procedure: CARDIOVERSION;  Surgeon: Chilton Si, MD;  Location: [facility] ENDOSCOPY;  Service: Cardiovascular;  Laterality: N/A;  . CATARACT EXTRACTION W/ INTRAOCULAR LENS  IMPLANT, BILATERAL    . COLONOSCOPY    . CORONARY ANGIOPLASTY  1990s   by Dr Aleen Campi  . ESOPHAGOGASTRECTOMY    . EYE SURGERY     GLAUCOMA SURG LT EYE  . KYPHOPLASTY N/A 08/09/2013   Procedure:  LUMBAR TWO KYPHOPLASTY;  Surgeon: Barnett Abu, MD;  Location: [facility] NEURO ORS;  Service: Neurosurgery;  Laterality: N/A;  L2 Kyphoplasty  . KYPHOPLASTY N/A 09/07/2013   Procedure: L1 KYPHOPLASTY;  Surgeon: Barnett Abu, MD;  Location: [facility] NEURO ORS;  Service: Neurosurgery;  Laterality: N/A;  . KYPHOPLASTY N/A 10/04/2013   Procedure: T12 KYPHOPLASTY;  Surgeon: Barnett Abu, MD;  Location: [facility] NEURO ORS;  Service: Neurosurgery;  Laterality: N/A;  T12 KYPHOPLASTY  . PACEMAKER INSERTION  11/13/11   MDT implanted by Dr Royann Shivers for 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 observed on EP study  . PERMANENT PACEMAKER INSERTION N/A 11/13/2011   Procedure: PERMANENT PACEMAKER INSERTION;  Surgeon: Thurmon Fair, MD;  Location: [facility] CATH LAB;  Service: Cardiovascular;  Laterality: N/A;  . TEE WITHOUT CARDIOVERSION  11/11/2011   Procedure: TRANSESOPHAGEAL ECHOCARDIOGRAM (TEE);  Surgeon: Chrystie Nose, MD;  Location: [facility] ENDOSCOPY;  Service: Cardiovascular;  Laterality: N/A;  . TEE WITHOUT CARDIOVERSION N/A 05/26/2019   Procedure: TRANSESOPHAGEAL ECHOCARDIOGRAM (TEE);  Surgeon: Chilton Si, MD;  Location: [facility] ENDOSCOPY;  Service: Cardiovascular;  Laterality: N/A;  . TEMPORARY PACEMAKER INSERTION  11/12/2011   Procedure: TEMPORARY PACEMAKER INSERTION;  Surgeon: [name], MD;  Location: [facility] CATH LAB;  Service: Cardiovascular;;    Current Outpatient Medications  Medication Sig Dispense Refill  . atorvastatin (LIPITOR) 40 MG tablet Take 40 mg by mouth every evening.  2  . brimonidine (ALPHAGAN P) 0.1 % SOLN Instill 3 drops in the right eye daily    . Cholecalciferol (VITAMIN D3) 50 MCG (2000 UT) TABS Take 2,000 Units by mouth daily with lunch.    . denosumab (PROLIA) 60 MG/ML SOLN injection Inject 60 mg into the skin every 6 (six) months. Administer in upper arm, thigh, or abdomen    . diltiazem (CARDIZEM CD) 120 MG 24 hr capsule Take 1 capsule (120 mg total) by mouth in the morning and at bedtime. 60 capsule 6   . diltiazem (CARDIZEM) 30 MG tablet Take 30 mg by mouth 4 (four) times daily. Heart rate > 100 as long as blood pressure is >100    . dorzolamide (TRUSOPT) 2 % ophthalmic solution Place 1 drop into the right eye 3 (three) times daily.    . furosemide (LASIX) 20 MG tablet Take 60 mg by mouth daily.    Marland Kitchen ketotifen (ZADITOR) 0.025 % ophthalmic solution Place 1 drop into the left eye 2 (two) times daily.    . Latanoprostene Bunod (VYZULTA) 0.024 % SOLN Place 1 drop into the right eye at bedtime.     . nitroGLYCERIN (NITROSTAT) 0.4 MG SL tablet Place 0.4 mg under the tongue every 5 (five) minutes as needed. For chest pain.    Marland Kitchen olmesartan (BENICAR) 20 MG tablet 1 tablet    . warfarin (COUMADIN) 5 MG tablet Take 2.5-5 mg by mouth See admin instructions. Take 1 tablet (5 mg) by mouth on Mondays, Wednesdays, & Fridays. Take 0.5 tablet (2.5 mg) by mouth on Sundays, Tuesdays, Thursdays, & Saturdays.     No current facility-administered medications for this visit.    Allergies:   Azor [amlodipine-olmesartan], Beta adrenergic blockers, Norvasc [amlodipine besylate], Pradaxa [dabigatran etexilate mesylate], Questran [cholestyramine], and Sulfa drugs cross reactors   Social History: Social History   Socioeconomic History  . Marital status: Married    Spouse name: Not on file  . Number of children: Not on file  . Years of education: Not on file  . Highest education level: Not on file  Occupational History  . Not on file  Tobacco Use  . Smoking status: Never Smoker  . Smokeless tobacco: Never Used  Vaping Use  . Vaping Use: Never used  Substance and Sexual Activity  . Alcohol use: No  . Drug use: No  . Sexual activity: Not Currently    Birth control/protection: Post-menopausal  Other Topics Concern  . Not on file  Social History Narrative   Pt lives in [place].  Retired form [employer].   Attends [employer]   Social Determinants of Health   Financial Resource Strain: Not on  file  Food Insecurity: Not on file  Transportation Needs: Not on file  Physical Activity: Not on file  Stress: Not on file  Social Connections: Not on file  Intimate Partner Violence: Not on file    Family History: Family History  Problem Relation Age of Onset  . Heart disease Mother   . Heart disease Father   . Heart disease Brother   . Cancer Maternal Grandmother   . Heart disease Maternal Grandfather   . Pneumonia Paternal Grandmother   . Pneumonia Paternal Grandfather   . Heart disease Brother   . Heart disease Brother   . Pneumonia Sister       Review of Systems: All other systems reviewed and are otherwise negative except as noted above.   Physical Exam: VS:  BP (!) 180/90  Pulse 73   Ht 5' 4.5" (1.638 m)   Wt 109 lb 9.6 oz (49.7 kg)   SpO2 100%   BMI 18.52 kg/m  , BMI Body mass index is 18.52 kg/m.  GEN- The patient is well appearing, alert and oriented x 3 today.   HEENT: normocephalic, atraumatic; sclera clear, conjunctiva pink; hearing intact; oropharynx clear; neck supple  Lungs- Clear to ausculation bilaterally, normal work of breathing.  No wheezes, rales, rhonchi Heart- Regular rate and rhythm, no murmurs, rubs or gallops  GI- soft, non-tender, non-distended, bowel sounds present  Extremities- no clubbing, cyanosis, or edema  MS- no significant deformity or atrophy Skin- warm and dry, no rash or lesion; PPM pocket well healed Psych- euthymic mood, full affect Neuro- strength and sensation are intact  PPM Interrogation- reviewed in detail today,  See PACEART report  EKG:  EKG is not ordered today.  Recent Labs: No results found for requested labs within last 8760 hours.   Wt Readings from Last 3 Encounters:  09/07/20 109 lb 9.6 oz (49.7 kg)  01/28/20 106 lb 12.8 oz (48.4 kg)  01/21/20 109 lb 6.4 oz (49.6 kg)     Other studies Reviewed: Additional studies/ records that were reviewed today include: Dr Jenel Lucks notes    Assessment and  Plan:  1.  Mobitz II  Normal PPM function See Pace Art report No changes today  2.  Persistent atrial fibrillation/flutter Burden by device interrogation 1% Continue Warfrain for CHADS2VASC of 4  3.  HTN BP at home consistently 130s No change required today  4.  CAD No recent ischemic symptoms    Current medicines are reviewed at length with the patient today.   The patient does not have concerns regarding her medicines.  The following changes were made today:  none  Labs/ tests ordered today include: none No orders of the defined types were placed in this encounter.    Disposition:   Follow up with [facility], 1 year Dr Johney Frame    Signed, Gypsy Balsam, NP 09/07/2020 11:53 AM  [facility] [address] [place] [address] [phone] (office) [phone] (fax)

## 2020-09-07 NOTE — Patient Instructions (Signed)
Medication Instructions:  Your physician recommends that you continue on your current medications as directed. Please refer to the Current Medication list given to you today.  *If you need a refill on your cardiac medications before your next appointment, please call your pharmacy*   Lab Work: None Today If you have labs (blood work) drawn today and your tests are completely normal, you will receive your results only by: Marland Kitchen MyChart Message (if you have MyChart) OR . A paper copy in the mail If you have any lab test that is abnormal or we need to change your treatment, we will call you to review the results.   Follow-Up: At [facility], you and your health needs are our priority.  As part of our continuing mission to provide you with exceptional heart care, we have created designated Provider Care Teams.  These Care Teams include your primary Cardiologist (physician) and Advanced Practice Providers (APPs -  Physician Assistants and Nurse Practitioners) who all work together to provide you with the care you need, when you need it.  We recommend signing up for the patient portal called "MyChart".  Sign up information is provided on this After Visit Summary.  MyChart is used to connect with patients for Virtual Visits (Telemedicine).  Patients are able to view lab/test results, encounter notes, upcoming appointments, etc.  Non-urgent messages can be sent to your provider as well.   To learn more about what you can do with MyChart, go to [email or web address].    Your next appointment:   1 year(s)  The format for your next appointment:   In Person  Provider:   You may see [name], MD or one of the following Advanced Practice Providers on your designated Care Team:    Gypsy Balsam, NP  Francis Dowse, PA-C  Casimiro Needle "[place]" [place], [place]

## 2020-09-12 DIAGNOSIS — H401133 Primary open-angle glaucoma, bilateral, severe stage: Secondary | ICD-10-CM | POA: Diagnosis not present

## 2020-09-21 ENCOUNTER — Ambulatory Visit (INDEPENDENT_AMBULATORY_CARE_PROVIDER_SITE_OTHER): Payer: Medicare Other

## 2020-09-21 DIAGNOSIS — I441 Atrioventricular block, second degree: Secondary | ICD-10-CM

## 2020-09-23 LAB — CUP PACEART REMOTE DEVICE CHECK
Battery Impedance: 1444 Ohm
Battery Remaining Longevity: 48 mo
Battery Voltage: 2.75 V
Brady Statistic AP VP Percent: 0 %
Brady Statistic AP VS Percent: 82 %
Brady Statistic AS VP Percent: 0 %
Brady Statistic AS VS Percent: 18 %
Date Time Interrogation Session: 20220324095917
Implantable Lead Implant Date: 20130515
Implantable Lead Implant Date: 20130515
Implantable Lead Location: 753859
Implantable Lead Location: 753860
Implantable Lead Model: 5076
Implantable Lead Model: 5076
Implantable Pulse Generator Implant Date: 20130515
Lead Channel Impedance Value: 424 Ohm
Lead Channel Impedance Value: 746 Ohm
Lead Channel Pacing Threshold Amplitude: 0.5 V
Lead Channel Pacing Threshold Amplitude: 1 V
Lead Channel Pacing Threshold Pulse Width: 0.4 ms
Lead Channel Pacing Threshold Pulse Width: 0.4 ms
Lead Channel Setting Pacing Amplitude: 2 V
Lead Channel Setting Pacing Amplitude: 2.5 V
Lead Channel Setting Pacing Pulse Width: 0.4 ms
Lead Channel Setting Sensing Sensitivity: 5.6 mV

## 2020-09-28 DIAGNOSIS — I4891 Unspecified atrial fibrillation: Secondary | ICD-10-CM | POA: Diagnosis not present

## 2020-09-28 DIAGNOSIS — I10 Essential (primary) hypertension: Secondary | ICD-10-CM | POA: Diagnosis not present

## 2020-09-28 DIAGNOSIS — Z7901 Long term (current) use of anticoagulants: Secondary | ICD-10-CM | POA: Diagnosis not present

## 2020-09-28 DIAGNOSIS — M810 Age-related osteoporosis without current pathological fracture: Secondary | ICD-10-CM | POA: Diagnosis not present

## 2020-10-03 NOTE — Progress Notes (Signed)
Remote pacemaker transmission.   

## 2020-10-19 ENCOUNTER — Ambulatory Visit (HOSPITAL_COMMUNITY)
Admission: RE | Admit: 2020-10-19 | Discharge: 2020-10-19 | Disposition: A | Discharge status: Home / Self Care | Payer: Medicare Other | Source: Ambulatory Visit | Attending: Nurse Practitioner | Admitting: Nurse Practitioner

## 2020-10-19 ENCOUNTER — Other Ambulatory Visit: Payer: Self-pay

## 2020-10-19 ENCOUNTER — Encounter (HOSPITAL_COMMUNITY): Payer: Self-pay | Admitting: Nurse Practitioner

## 2020-10-19 VITALS — BP 132/88 | HR 135 | Ht 64.5 in | Wt 109.8 lb

## 2020-10-19 DIAGNOSIS — I10 Essential (primary) hypertension: Secondary | ICD-10-CM | POA: Diagnosis not present

## 2020-10-19 DIAGNOSIS — Z7901 Long term (current) use of anticoagulants: Secondary | ICD-10-CM | POA: Insufficient documentation

## 2020-10-19 DIAGNOSIS — D6869 Other thrombophilia: Secondary | ICD-10-CM

## 2020-10-19 DIAGNOSIS — Z79899 Other long term (current) drug therapy: Secondary | ICD-10-CM | POA: Insufficient documentation

## 2020-10-19 DIAGNOSIS — I4819 Other persistent atrial fibrillation: Secondary | ICD-10-CM | POA: Insufficient documentation

## 2020-10-19 DIAGNOSIS — Z8249 Family history of ischemic heart disease and other diseases of the circulatory system: Secondary | ICD-10-CM | POA: Diagnosis not present

## 2020-10-19 DIAGNOSIS — I4892 Unspecified atrial flutter: Secondary | ICD-10-CM | POA: Diagnosis not present

## 2020-10-19 LAB — BASIC METABOLIC PANEL
Anion gap: 5 (ref 5–15)
BUN: 22 mg/dL (ref 8–23)
CO2: 31 mmol/L (ref 22–32)
Calcium: 10.6 mg/dL — ABNORMAL HIGH (ref 8.9–10.3)
Chloride: 102 mmol/L (ref 98–111)
Creatinine, Ser: 0.77 mg/dL (ref 0.44–1.00)
GFR, Estimated: >60 mL/min (ref >60)
Glucose, Bld: 106 mg/dL — ABNORMAL HIGH (ref 70–99)
Potassium: 3.8 mmol/L (ref 3.5–5.1)
Sodium: 138 mmol/L (ref 135–145)

## 2020-10-19 LAB — CBC
HCT: 37.9 % (ref 36.0–46.0)
Hemoglobin: 12.5 g/dL (ref 12.0–15.0)
MCH: 31 pg (ref 26.0–34.0)
MCHC: 33 g/dL (ref 30.0–36.0)
MCV: 94 fL (ref 80.0–100.0)
Platelets: 216 10*3/uL (ref 150–400)
RBC: 4.03 MIL/uL (ref 3.87–5.11)
RDW: 13.2 % (ref 11.5–15.5)
WBC: 5.9 10*3/uL (ref 4.0–10.5)
nRBC: 0 % (ref 0.0–0.2)

## 2020-10-19 LAB — PROTIME-INR
INR: 2.7 — ABNORMAL HIGH (ref 0.8–1.2)
Prothrombin Time: 28.8 seconds — ABNORMAL HIGH (ref 11.4–15.2)

## 2020-10-19 NOTE — Patient Instructions (Addendum)
Cardioversion scheduled for Tuesday, May 3rd  - Arrive at the [facility] and go to admitting at [facility] not eat or drink anything after midnight the night prior to your procedure.  - Take all your morning medication (except diabetic medications) with a sip of water prior to arrival.  - You will not be able to drive home after your procedure.  - Do NOT miss any doses of your blood thinner - if you should miss a dose please notify our office immediately.  - If you feel as if you go back into normal rhythm prior to scheduled cardioversion, please notify our office immediately. If your procedure is canceled in the cardioversion suite you will be charged a cancellation fee.    Will need INR check prior to arrival at admissions.  Hold olmesartan until cardioversion

## 2020-10-19 NOTE — Progress Notes (Signed)
Primary Care Physician: [name], MD Referring Physician: Device clinic Cardiologist: Dr. Vira Agar is a 87 y.o. female with a h/o paroxysmal afib/flutter, PPM with second degree Mobitz II AV block, CAD, HTN that is in the afib clinic  for pt reporting elevated heart rate since Tuesday pm.. She has taken 2  extra 30 mg diltiazem to no avail controlling her v rates. She usually runs very fast with her atrial flutter.  Ekg shows atrial flutter at 135 bpm. BP is stable. No known trigger. She continues on warfarin with a CHA2DS2VASc score of at least 5.   Today, she denies symptoms of palpitations, chest pain, shortness of breath, orthopnea, PND, lower extremity edema, dizziness, presyncope, syncope, or neurologic sequela. The patient is tolerating medications without difficulties and is otherwise without complaint today.   Past Medical History:  Diagnosis Date  . Anemia   . Bilateral carotid bruits   . Coronary disease    s/p PTCA 1990s  . GERD (gastroesophageal reflux disease)    takes Protonix daily  . GI bleed    previously with pradaxa  . Glaucoma    uses Eye Drops daily  . H/O hiatal hernia   . Hyperlipidemia    takes Atorvastatin daily  . Hypertension    takes Azor and Apresoline daily  . LBBB (left bundle branch block)   . Pacemaker    MDT Advisa May 2013, Dr. Salena Saner  . PAF (paroxysmal atrial fibrillation) [facility])    PVI ablation May 2013, takes Coumadin daily  . Sore throat    since anesthesia in Feb 2015 AND ALLEGERIES   Past Surgical History:  Procedure Laterality Date  . atrial fibrillation ablation  11/12/11   PVI by Dr Johney Frame  . ATRIAL FIBRILLATION ABLATION N/A 11/12/2011   Procedure: ATRIAL FIBRILLATION ABLATION;  Surgeon: [name], MD;  Location: [facility] CATH LAB;  Service: Cardiovascular;  Laterality: N/A;  . CARDIOVERSION N/A 05/26/2019   Procedure: CARDIOVERSION;  Surgeon: Chilton Si, MD;  Location: [facility] ENDOSCOPY;  Service: Cardiovascular;   Laterality: N/A;  . CATARACT EXTRACTION W/ INTRAOCULAR LENS  IMPLANT, BILATERAL    . COLONOSCOPY    . CORONARY ANGIOPLASTY  1990s   by Dr Aleen Campi  . ESOPHAGOGASTRECTOMY    . EYE SURGERY     GLAUCOMA SURG LT EYE  . KYPHOPLASTY N/A 08/09/2013   Procedure: LUMBAR TWO KYPHOPLASTY;  Surgeon: Barnett Abu, MD;  Location: [facility] NEURO ORS;  Service: Neurosurgery;  Laterality: N/A;  L2 Kyphoplasty  . KYPHOPLASTY N/A 09/07/2013   Procedure: L1 KYPHOPLASTY;  Surgeon: Barnett Abu, MD;  Location: [facility] NEURO ORS;  Service: Neurosurgery;  Laterality: N/A;  . KYPHOPLASTY N/A 10/04/2013   Procedure: T12 KYPHOPLASTY;  Surgeon: Barnett Abu, MD;  Location: [facility] NEURO ORS;  Service: Neurosurgery;  Laterality: N/A;  T12 KYPHOPLASTY  . PACEMAKER INSERTION  11/13/11   MDT implanted by Dr Royann Shivers for 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 observed on EP study  . PERMANENT PACEMAKER INSERTION N/A 11/13/2011   Procedure: PERMANENT PACEMAKER INSERTION;  Surgeon: Thurmon Fair, MD;  Location: [facility] CATH LAB;  Service: Cardiovascular;  Laterality: N/A;  . TEE WITHOUT CARDIOVERSION  11/11/2011   Procedure: TRANSESOPHAGEAL ECHOCARDIOGRAM (TEE);  Surgeon: Chrystie Nose, MD;  Location: [facility] ENDOSCOPY;  Service: Cardiovascular;  Laterality: N/A;  . TEE WITHOUT CARDIOVERSION N/A 05/26/2019   Procedure: TRANSESOPHAGEAL ECHOCARDIOGRAM (TEE);  Surgeon: Chilton Si, MD;  Location: [facility] ENDOSCOPY;  Service: Cardiovascular;  Laterality: N/A;  .  TEMPORARY PACEMAKER INSERTION  11/12/2011   Procedure: TEMPORARY PACEMAKER INSERTION;  Surgeon: [name], MD;  Location: [facility] CATH LAB;  Service: Cardiovascular;;    Current Outpatient Medications  Medication Sig Dispense Refill  . atorvastatin (LIPITOR) 40 MG tablet Take 40 mg by mouth every evening.   2  . brimonidine (ALPHAGAN P) 0.1 % SOLN Instill 3 drops in the right eye daily    . Cholecalciferol (VITAMIN D3) 50 MCG (2000 UT) TABS Take 2,000 Units by mouth daily with lunch.    .  denosumab (PROLIA) 60 MG/ML SOLN injection Inject 60 mg into the skin every 6 (six) months. Administer in upper arm, thigh, or abdomen    . diltiazem (CARDIZEM CD) 120 MG 24 hr capsule Take 1 capsule (120 mg total) by mouth in the morning and at bedtime. 180 capsule 3  . diltiazem (CARDIZEM) 30 MG tablet Take 30 mg by mouth 4 (four) times daily. Heart rate > 100 as long as blood pressure is >100    . dorzolamide (TRUSOPT) 2 % ophthalmic solution Place 1 drop into the right eye 3 (three) times daily.    . furosemide (LASIX) 20 MG tablet Take 60 mg by mouth daily.    Marland Kitchen ketotifen (ZADITOR) 0.025 % ophthalmic solution Place 1 drop into the left eye 2 (two) times daily.    . Latanoprostene Bunod (VYZULTA) 0.024 % SOLN Place 1 drop into the right eye at bedtime.     . nitroGLYCERIN (NITROSTAT) 0.4 MG SL tablet Place 0.4 mg under the tongue every 5 (five) minutes as needed. For chest pain.    Marland Kitchen olmesartan (BENICAR) 20 MG tablet 1 tablet    . warfarin (COUMADIN) 5 MG tablet Take 2.5-5 mg by mouth See admin instructions. Take 1 tablet (5 mg) by mouth on Mondays, Wednesdays, & Fridays. Take 0.5 tablet (2.5 mg) by mouth on Sundays, Tuesdays, Thursdays, & Saturdays.     No current facility-administered medications for this encounter.    Allergies  Allergen Reactions  . Azor [Amlodipine-Olmesartan] Other (See Comments)    Higher doses cause ankles to swell  . Beta Adrenergic Blockers Other (See Comments)    Bradycardia.  . Norvasc [Amlodipine Besylate] Other (See Comments)    edema  . Pradaxa [Dabigatran Etexilate Mesylate] Other (See Comments)    GI Bleed  . Questran [Cholestyramine] Other (See Comments)    Constipation  . Sulfa Drugs Cross Reactors Hives    Social History   Socioeconomic History  . Marital status: Married    Spouse name: Not on file  . Number of children: Not on file  . Years of education: Not on file  . Highest education level: Not on file  Occupational History  . Not on  file  Tobacco Use  . Smoking status: Never Smoker  . Smokeless tobacco: Never Used  Vaping Use  . Vaping Use: Never used  Substance and Sexual Activity  . Alcohol use: No  . Drug use: No  . Sexual activity: Not Currently    Birth control/protection: Post-menopausal  Other Topics Concern  . Not on file  Social History Narrative   Pt lives in [place].  Retired form [employer].   Attends [employer]   Social Determinants of Health   Financial Resource Strain: Not on file  Food Insecurity: Not on file  Transportation Needs: Not on file  Physical Activity: Not on file  Stress: Not on file  Social Connections: Not on file  Intimate Partner Violence: Not  on file    Family History  Problem Relation Age of Onset  . Heart disease Mother   . Heart disease Father   . Heart disease Brother   . Cancer Maternal Grandmother   . Heart disease Maternal Grandfather   . Pneumonia Paternal Grandmother   . Pneumonia Paternal Grandfather   . Heart disease Brother   . Heart disease Brother   . Pneumonia Sister     ROS- All systems are reviewed and negative except as per the HPI above  Physical Exam: Vitals:   10/19/20 1541  BP: 132/88  Pulse: (!) 135  Weight: 49.8 kg  Height: 5' 4.5" (1.638 m)   Wt Readings from Last 3 Encounters:  10/19/20 49.8 kg  09/07/20 49.7 kg  01/28/20 48.4 kg    Labs: Lab Results  Component Value Date   NA 139 05/24/2019   K 4.2 05/24/2019   CL 106 05/24/2019   CO2 27 05/24/2019   GLUCOSE 103 (H) 05/24/2019   BUN 19 05/24/2019   CREATININE 0.61 05/24/2019   CALCIUM 10.1 05/24/2019   MG 2.2 09/13/2010   Lab Results  Component Value Date   INR 2.8 (A) 06/15/2019   Lab Results  Component Value Date   CHOL  04/30/2010    149        ATP III CLASSIFICATION:  <200     mg/dL   Desirable  701-779  mg/dL   Borderline High  >=390    mg/dL   High          HDL 71 04/30/2010   LDLCALC  04/30/2010    70        Total Cholesterol/HDL:CHD  Risk Coronary Heart Disease Risk Table                     Men   Women  1/2 Average Risk   3.4   3.3  Average Risk       5.0   4.4  2 X Average Risk   9.6   7.1  3 X Average Risk  23.4   11.0        Use the calculated Patient Ratio above and the CHD Risk Table to determine the patient's CHD Risk.        ATP III CLASSIFICATION (LDL):  <100     mg/dL   Optimal  300-923  mg/dL   Near or Above                    Optimal  130-159  mg/dL   Borderline  300-762  mg/dL   High  >263     mg/dL   Very High   TRIG 42 33/54/5625     GEN- The patient is well appearing, alert and oriented x 3 today.   Head- normocephalic, atraumatic Eyes-  Sclera clear, conjunctiva pink Ears- hearing intact Oropharynx- clear Neck- supple, no JVP Lymph- no cervical lymphadenopathy Lungs- Clear to ausculation bilaterally, normal work of breathing Heart- regular rate and rhythm, no murmurs, rubs or gallops, PMI not laterally displaced GI- soft, NT, ND, + BS Extremities- no clubbing, cyanosis, or edema MS- no significant deformity or atrophy Skin- no rash or lesion Psych- euthymic mood, full affect Neuro- strength and sensation are intact  EKG- Probable atrial flutter at 135 ms, qrs int 128 ms, qtc 510 ms    Assessment and Plan: 1. Atrial fib/flutter with RVR Persistent  X 2 days  Increased Cardizem to 120  mg bid last time she went into flutter and she was able to self convert  She has continued on Cardizem at 120 mg bid since last July but now back in flutter  She is using 30 mg Cardizem as needed  to control v rates Will go ahead and set up TEE cardioversion as I am afraid she may deteriorate with RVR if left unchecked for the 4 weeks to establish therapeutic INR's  CBC/bmet/INR/covid   2. CHA2DS2VASc score  of at least 4 Continue  Warfarin Will need INR am of cardioversion   3. HTN Stable  Hold olmesartan while taking extra 30 mg Cardizem to control v rates    Will see one week after  cardioversion    Lupita Leash C. Matthew Folks [facility] [address] [place], [place] [address] [phone]

## 2020-10-23 ENCOUNTER — Ambulatory Visit (HOSPITAL_COMMUNITY)
Admission: RE | Admit: 2020-10-23 | Discharge: 2020-10-23 | Disposition: A | Discharge status: Home / Self Care | Payer: Medicare Other | Source: Ambulatory Visit | Attending: Physician Assistant | Admitting: Physician Assistant

## 2020-10-23 ENCOUNTER — Other Ambulatory Visit: Payer: Self-pay

## 2020-10-23 DIAGNOSIS — I4892 Unspecified atrial flutter: Secondary | ICD-10-CM | POA: Diagnosis not present

## 2020-10-23 NOTE — Progress Notes (Signed)
Patient returns for ECG because she felt she was back in SR on 10/21/20. ECG showed SR HR 82, LBBB, PR 172, QRS 130, QTc 455. Will cancel DCCV. Will continue to hold olmesartan has her BP has been low normal even back in SR. F/u with Rudi Coco in 6 months.

## 2020-10-23 NOTE — Patient Instructions (Signed)
Stop omlesartan

## 2020-10-26 DIAGNOSIS — I10 Essential (primary) hypertension: Secondary | ICD-10-CM | POA: Diagnosis not present

## 2020-10-26 DIAGNOSIS — Z7901 Long term (current) use of anticoagulants: Secondary | ICD-10-CM | POA: Diagnosis not present

## 2020-10-26 DIAGNOSIS — I4891 Unspecified atrial fibrillation: Secondary | ICD-10-CM | POA: Diagnosis not present

## 2020-10-26 DIAGNOSIS — M810 Age-related osteoporosis without current pathological fracture: Secondary | ICD-10-CM | POA: Diagnosis not present

## 2020-10-27 ENCOUNTER — Other Ambulatory Visit (HOSPITAL_COMMUNITY): Payer: Medicare Other

## 2020-10-31 ENCOUNTER — Ambulatory Visit (HOSPITAL_COMMUNITY): Admit: 2020-10-31 | Payer: Medicare Other | Admitting: Internal Medicine

## 2020-10-31 ENCOUNTER — Encounter (HOSPITAL_COMMUNITY): Payer: Self-pay

## 2020-10-31 SURGERY — CARDIOVERSION
Anesthesia: General

## 2020-11-07 ENCOUNTER — Ambulatory Visit (HOSPITAL_COMMUNITY): Payer: Medicare Other | Admitting: Nurse Practitioner

## 2020-11-23 DIAGNOSIS — Z7901 Long term (current) use of anticoagulants: Secondary | ICD-10-CM | POA: Diagnosis not present

## 2020-11-23 DIAGNOSIS — M810 Age-related osteoporosis without current pathological fracture: Secondary | ICD-10-CM | POA: Diagnosis not present

## 2020-11-23 DIAGNOSIS — I10 Essential (primary) hypertension: Secondary | ICD-10-CM | POA: Diagnosis not present

## 2020-11-23 DIAGNOSIS — I4891 Unspecified atrial fibrillation: Secondary | ICD-10-CM | POA: Diagnosis not present

## 2020-12-21 ENCOUNTER — Ambulatory Visit (INDEPENDENT_AMBULATORY_CARE_PROVIDER_SITE_OTHER): Payer: Medicare Other

## 2020-12-21 DIAGNOSIS — I441 Atrioventricular block, second degree: Secondary | ICD-10-CM | POA: Diagnosis not present

## 2020-12-21 LAB — CUP PACEART REMOTE DEVICE CHECK
Battery Impedance: 1525 Ohm
Battery Remaining Longevity: 45 mo
Battery Voltage: 2.75 V
Brady Statistic AP VP Percent: 21 %
Brady Statistic AP VS Percent: 53 %
Brady Statistic AS VP Percent: 8 %
Brady Statistic AS VS Percent: 18 %
Date Time Interrogation Session: 20220623143616
Implantable Lead Implant Date: 20130515
Implantable Lead Implant Date: 20130515
Implantable Lead Location: 753859
Implantable Lead Location: 753860
Implantable Lead Model: 5076
Implantable Lead Model: 5076
Implantable Pulse Generator Implant Date: 20130515
Lead Channel Impedance Value: 419 Ohm
Lead Channel Impedance Value: 764 Ohm
Lead Channel Pacing Threshold Amplitude: 0.5 V
Lead Channel Pacing Threshold Amplitude: 1 V
Lead Channel Pacing Threshold Pulse Width: 0.4 ms
Lead Channel Pacing Threshold Pulse Width: 0.4 ms
Lead Channel Setting Pacing Amplitude: 2 V
Lead Channel Setting Pacing Amplitude: 2.5 V
Lead Channel Setting Pacing Pulse Width: 0.4 ms
Lead Channel Setting Sensing Sensitivity: 4 mV

## 2020-12-27 DIAGNOSIS — M810 Age-related osteoporosis without current pathological fracture: Secondary | ICD-10-CM | POA: Diagnosis not present

## 2021-01-02 DIAGNOSIS — I4891 Unspecified atrial fibrillation: Secondary | ICD-10-CM | POA: Diagnosis not present

## 2021-01-02 DIAGNOSIS — M810 Age-related osteoporosis without current pathological fracture: Secondary | ICD-10-CM | POA: Diagnosis not present

## 2021-01-02 DIAGNOSIS — Z7901 Long term (current) use of anticoagulants: Secondary | ICD-10-CM | POA: Diagnosis not present

## 2021-01-02 DIAGNOSIS — I10 Essential (primary) hypertension: Secondary | ICD-10-CM | POA: Diagnosis not present

## 2021-01-09 NOTE — Progress Notes (Signed)
Remote pacemaker transmission.   

## 2021-01-16 DIAGNOSIS — H401133 Primary open-angle glaucoma, bilateral, severe stage: Secondary | ICD-10-CM | POA: Diagnosis not present

## 2021-01-30 DIAGNOSIS — Z7901 Long term (current) use of anticoagulants: Secondary | ICD-10-CM | POA: Diagnosis not present

## 2021-01-30 DIAGNOSIS — M810 Age-related osteoporosis without current pathological fracture: Secondary | ICD-10-CM | POA: Diagnosis not present

## 2021-01-30 DIAGNOSIS — I4891 Unspecified atrial fibrillation: Secondary | ICD-10-CM | POA: Diagnosis not present

## 2021-01-30 DIAGNOSIS — I10 Essential (primary) hypertension: Secondary | ICD-10-CM | POA: Diagnosis not present

## 2021-02-27 DIAGNOSIS — I4891 Unspecified atrial fibrillation: Secondary | ICD-10-CM | POA: Diagnosis not present

## 2021-02-27 DIAGNOSIS — I10 Essential (primary) hypertension: Secondary | ICD-10-CM | POA: Diagnosis not present

## 2021-02-27 DIAGNOSIS — M810 Age-related osteoporosis without current pathological fracture: Secondary | ICD-10-CM | POA: Diagnosis not present

## 2021-02-27 DIAGNOSIS — Z7901 Long term (current) use of anticoagulants: Secondary | ICD-10-CM | POA: Diagnosis not present

## 2021-03-02 ENCOUNTER — Other Ambulatory Visit (HOSPITAL_COMMUNITY): Payer: Self-pay | Admitting: Nurse Practitioner

## 2021-03-08 ENCOUNTER — Encounter (HOSPITAL_COMMUNITY): Payer: Self-pay | Admitting: Nurse Practitioner

## 2021-03-08 ENCOUNTER — Ambulatory Visit (HOSPITAL_COMMUNITY)
Admission: RE | Admit: 2021-03-08 | Discharge: 2021-03-08 | Disposition: A | Discharge status: Home / Self Care | Payer: Medicare Other | Source: Ambulatory Visit | Attending: Nurse Practitioner | Admitting: Nurse Practitioner

## 2021-03-08 ENCOUNTER — Other Ambulatory Visit: Payer: Self-pay

## 2021-03-08 VITALS — BP 132/84 | HR 91 | Ht 64.5 in | Wt 111.2 lb

## 2021-03-08 DIAGNOSIS — Z888 Allergy status to other drugs, medicaments and biological substances status: Secondary | ICD-10-CM | POA: Diagnosis not present

## 2021-03-08 DIAGNOSIS — I480 Paroxysmal atrial fibrillation: Secondary | ICD-10-CM | POA: Diagnosis not present

## 2021-03-08 DIAGNOSIS — I4819 Other persistent atrial fibrillation: Secondary | ICD-10-CM | POA: Diagnosis not present

## 2021-03-08 DIAGNOSIS — D6869 Other thrombophilia: Secondary | ICD-10-CM | POA: Diagnosis not present

## 2021-03-08 DIAGNOSIS — Z79899 Other long term (current) drug therapy: Secondary | ICD-10-CM | POA: Insufficient documentation

## 2021-03-08 DIAGNOSIS — I441 Atrioventricular block, second degree: Secondary | ICD-10-CM | POA: Insufficient documentation

## 2021-03-08 DIAGNOSIS — I10 Essential (primary) hypertension: Secondary | ICD-10-CM | POA: Diagnosis not present

## 2021-03-08 DIAGNOSIS — Z7983 Long term (current) use of bisphosphonates: Secondary | ICD-10-CM | POA: Insufficient documentation

## 2021-03-08 DIAGNOSIS — Z7901 Long term (current) use of anticoagulants: Secondary | ICD-10-CM | POA: Diagnosis not present

## 2021-03-08 DIAGNOSIS — Z950 Presence of cardiac pacemaker: Secondary | ICD-10-CM | POA: Diagnosis not present

## 2021-03-08 DIAGNOSIS — I2510 Atherosclerotic heart disease of native coronary artery without angina pectoris: Secondary | ICD-10-CM | POA: Diagnosis not present

## 2021-03-08 LAB — CBC
HCT: 38.8 % (ref 36.0–46.0)
Hemoglobin: 12.8 g/dL (ref 12.0–15.0)
MCH: 31.1 pg (ref 26.0–34.0)
MCHC: 33 g/dL (ref 30.0–36.0)
MCV: 94.2 fL (ref 80.0–100.0)
Platelets: 210 10*3/uL (ref 150–400)
RBC: 4.12 MIL/uL (ref 3.87–5.11)
RDW: 13.5 % (ref 11.5–15.5)
WBC: 6 10*3/uL (ref 4.0–10.5)
nRBC: 0 % (ref 0.0–0.2)

## 2021-03-08 LAB — BASIC METABOLIC PANEL
Anion gap: 6 (ref 5–15)
BUN: 20 mg/dL (ref 8–23)
CO2: 28 mmol/L (ref 22–32)
Calcium: 10.4 mg/dL — ABNORMAL HIGH (ref 8.9–10.3)
Chloride: 104 mmol/L (ref 98–111)
Creatinine, Ser: 0.68 mg/dL (ref 0.44–1.00)
GFR, Estimated: >60 mL/min (ref >60)
Glucose, Bld: 116 mg/dL — ABNORMAL HIGH (ref 70–99)
Potassium: 4.3 mmol/L (ref 3.5–5.1)
Sodium: 138 mmol/L (ref 135–145)

## 2021-03-08 LAB — PROTIME-INR
INR: 2.9 — ABNORMAL HIGH (ref 0.8–1.2)
Prothrombin Time: 30.4 seconds — ABNORMAL HIGH (ref 11.4–15.2)

## 2021-03-08 NOTE — Progress Notes (Signed)
Primary Care Physician: [name], MD Referring Physician: Device clinic Cardiologist: Rhonda [name] is a 87 y.o. female with a h/o paroxysmal afib/flutter, PPM with second degree Mobitz II AV block, CAD, HTN that is in the afib clinic  for pt reporting elevated heart rate since Tuesday pm.. She has taken 2  extra 30 mg diltiazem to no avail controlling her v rates. She usually runs very fast with her atrial flutter.  Ekg shows atrial flutter at 135 bpm. BP is stable. No known trigger. She continues on warfarin with a CHA2DS2VASc score of at least 5.   F/u in afib clinic, 03/08/21. She  is here as she went into afib last week. She feels fatigued in afib. Her heart rates have been controlled . 2x she has taken a 30 mg Cardizem for a HR over 100 bpm.  She is on warfarin and does not want to wait the 4 weeks for the therapeutic warfarin levels. She has had a TEE/CV in the past.Last INR at PCP 8/30 was 2.7.   Today, she denies symptoms of palpitations, chest pain, shortness of breath, orthopnea, PND, lower extremity edema, dizziness, presyncope, syncope, or neurologic sequela. The patient is tolerating medications without difficulties and is otherwise without complaint today.   Past Medical History:  Diagnosis Date   Anemia    Bilateral carotid bruits    Coronary disease    s/p PTCA 1990s   GERD (gastroesophageal reflux disease)    takes Protonix daily   GI bleed    previously with pradaxa   Glaucoma    uses Eye Drops daily   H/O hiatal hernia    Hyperlipidemia    takes Atorvastatin daily   Hypertension    takes Azor and Apresoline daily   LBBB (left bundle branch block)    Pacemaker    MDT Advisa May 2013, Dr. Salena Saner   PAF (paroxysmal atrial fibrillation) (HCC)    PVI ablation May 2013, takes Coumadin daily   Sore throat    since anesthesia in Feb 2015 AND ALLEGERIES   Past Surgical History:  Procedure Laterality Date   atrial fibrillation ablation  11/12/11   PVI  by Dr Johney Frame   ATRIAL FIBRILLATION ABLATION N/A 11/12/2011   Procedure: ATRIAL FIBRILLATION ABLATION;  Surgeon: [name], MD;  Location: [facility] CATH LAB;  Service: Cardiovascular;  Laterality: N/A;   CARDIOVERSION N/A 05/26/2019   Procedure: CARDIOVERSION;  Surgeon: Chilton Si, MD;  Location: [facility] ENDOSCOPY;  Service: Cardiovascular;  Laterality: N/A;   CATARACT EXTRACTION W/ INTRAOCULAR LENS  IMPLANT, BILATERAL     COLONOSCOPY     CORONARY ANGIOPLASTY  1990s   by Dr Aleen Campi   ESOPHAGOGASTRECTOMY     EYE SURGERY     GLAUCOMA SURG LT EYE   KYPHOPLASTY N/A 08/09/2013   Procedure: LUMBAR TWO KYPHOPLASTY;  Surgeon: Barnett Abu, MD;  Location: [facility] NEURO ORS;  Service: Neurosurgery;  Laterality: N/A;  L2 Kyphoplasty   KYPHOPLASTY N/A 09/07/2013   Procedure: L1 KYPHOPLASTY;  Surgeon: Barnett Abu, MD;  Location: [facility] NEURO ORS;  Service: Neurosurgery;  Laterality: N/A;   KYPHOPLASTY N/A 10/04/2013   Procedure: T12 KYPHOPLASTY;  Surgeon: Barnett Abu, MD;  Location: [facility] NEURO ORS;  Service: Neurosurgery;  Laterality: N/A;  T12 KYPHOPLASTY   PACEMAKER INSERTION  11/13/11   MDT implanted by Dr Royann Shivers for 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 observed on EP study   PERMANENT PACEMAKER INSERTION N/A 11/13/2011   Procedure: PERMANENT  PACEMAKER INSERTION;  Surgeon: Thurmon Fair, MD;  Location: [facility] CATH LAB;  Service: Cardiovascular;  Laterality: N/A;   TEE WITHOUT CARDIOVERSION  11/11/2011   Procedure: TRANSESOPHAGEAL ECHOCARDIOGRAM (TEE);  Surgeon: Chrystie Nose, MD;  Location: [facility] ENDOSCOPY;  Service: Cardiovascular;  Laterality: N/A;   TEE WITHOUT CARDIOVERSION N/A 05/26/2019   Procedure: TRANSESOPHAGEAL ECHOCARDIOGRAM (TEE);  Surgeon: Chilton Si, MD;  Location: [facility] ENDOSCOPY;  Service: Cardiovascular;  Laterality: N/A;   TEMPORARY PACEMAKER INSERTION  11/12/2011   Procedure: TEMPORARY PACEMAKER INSERTION;  Surgeon: [name], MD;  Location: [facility] CATH LAB;  Service: Cardiovascular;;     Current Outpatient Medications  Medication Sig Dispense Refill   atorvastatin (LIPITOR) 40 MG tablet Take 40 mg by mouth every evening.   2   brimonidine (ALPHAGAN P) 0.1 % SOLN Instill 3 drops in the right eye daily     Cholecalciferol (VITAMIN D3) 50 MCG (2000 UT) TABS Take 2,000 Units by mouth daily with lunch.     denosumab (PROLIA) 60 MG/ML SOLN injection Inject 60 mg into the skin every 6 (six) months. Administer in upper arm, thigh, or abdomen     diltiazem (CARDIZEM CD) 120 MG 24 hr capsule Take 1 capsule (120 mg total) by mouth in the morning and at bedtime. 180 capsule 3   diltiazem (CARDIZEM) 30 MG tablet TAKE 1 TABLET BY MOUTH EVERY 4 HOURS AS NEEDED FOR  HEART  RATE  GREATER  THAN  100  AS  LONG  AS  BLOOD  PRESSURE  GREATER  THAN  100 45 tablet 0   dorzolamide (TRUSOPT) 2 % ophthalmic solution Place 1 drop into the right eye 3 (three) times daily.     furosemide (LASIX) 20 MG tablet Take 60 mg by mouth daily.     ketotifen (ZADITOR) 0.025 % ophthalmic solution Place 1 drop into the left eye 2 (two) times daily.     Latanoprostene Bunod (VYZULTA) 0.024 % SOLN Place 1 drop into the right eye at bedtime.      nitroGLYCERIN (NITROSTAT) 0.4 MG SL tablet Place 0.4 mg under the tongue every 5 (five) minutes as needed. For chest pain.     olmesartan (BENICAR) 20 MG tablet 1/2 tablet     warfarin (COUMADIN) 5 MG tablet Take 2.5-5 mg by mouth See admin instructions. Take 1 tablet (5 mg) by mouth on Mondays, Wednesdays, & Fridays. Take 0.5 tablet (2.5 mg) by mouth on Sundays, Tuesdays, Thursdays, & Saturdays.     No current facility-administered medications for this encounter.    Allergies  Allergen Reactions   Azor [Amlodipine-Olmesartan] Other (See Comments)    Higher doses cause ankles to swell   Beta Adrenergic Blockers Other (See Comments)    Bradycardia.   Norvasc [Amlodipine Besylate] Other (See Comments)    edema   Parathyroid Hormone (Recomb)     Other reaction(s):  nausea   Pradaxa [Dabigatran Etexilate Mesylate] Other (See Comments)    GI Bleed   Questran [Cholestyramine] Other (See Comments)    Constipation   Sulfa Drugs Cross Reactors Hives    Social History   Socioeconomic History   Marital status: Married    Spouse name: Not on file   Number of children: Not on file   Years of education: Not on file   Highest education level: Not on file  Occupational History   Not on file  Tobacco Use   Smoking status: Never   Smokeless tobacco: Never  Vaping Use   Vaping Use: Never  used  Substance and Sexual Activity   Alcohol use: No   Drug use: No   Sexual activity: Not Currently    Birth control/protection: Post-menopausal  Other Topics Concern   Not on file  Social History Narrative   Pt lives in [place].  Retired form [employer].   Attends [employer]   Social Determinants of Health   Financial Resource Strain: Not on file  Food Insecurity: Not on file  Transportation Needs: Not on file  Physical Activity: Not on file  Stress: Not on file  Social Connections: Not on file  Intimate Partner Violence: Not on file    Family History  Problem Relation Age of Onset   Heart disease Mother    Heart disease Father    Heart disease Brother    Cancer Maternal Grandmother    Heart disease Maternal Grandfather    Pneumonia Paternal Grandmother    Pneumonia Paternal Grandfather    Heart disease Brother    Heart disease Brother    Pneumonia Sister     ROS- All systems are reviewed and negative except as per the HPI above  Physical Exam: Vitals:   03/08/21 1528  BP: 132/84  Pulse: 91  Weight: 50.4 kg  Height: 5' 4.5" (1.638 m)   Wt Readings from Last 3 Encounters:  03/08/21 50.4 kg  10/19/20 49.8 kg  09/07/20 49.7 kg    Labs: Lab Results  Component Value Date   NA 138 10/19/2020   K 3.8 10/19/2020   CL 102 10/19/2020   CO2 31 10/19/2020   GLUCOSE 106 (H) 10/19/2020   BUN 22 10/19/2020   CREATININE 0.77  10/19/2020   CALCIUM 10.6 (H) 10/19/2020   MG 2.2 09/13/2010   Lab Results  Component Value Date   INR 2.7 (H) 10/19/2020   Lab Results  Component Value Date   CHOL  04/30/2010    149        ATP III CLASSIFICATION:  <200     mg/dL   Desirable  338-250  mg/dL   Borderline High  >=539    mg/dL   High          HDL 71 04/30/2010   LDLCALC  04/30/2010    70        Total Cholesterol/HDL:CHD Risk Coronary Heart Disease Risk Table                     Men   Women  1/2 Average Risk   3.4   3.3  Average Risk       5.0   4.4  2 X Average Risk   9.6   7.1  3 X Average Risk  23.4   11.0        Use the calculated Patient Ratio above and the CHD Risk Table to determine the patient's CHD Risk.        ATP III CLASSIFICATION (LDL):  <100     mg/dL   Optimal  767-341  mg/dL   Near or Above                    Optimal  130-159  mg/dL   Borderline  937-902  mg/dL   High  >409     mg/dL   Very High   TRIG 42 73/53/2992     GEN- The patient is well appearing, alert and oriented x 3 today.   Head- normocephalic, atraumatic Eyes-  Sclera clear, conjunctiva pink Ears-  hearing intact Oropharynx- clear Neck- supple, no JVP Lymph- no cervical lymphadenopathy Lungs- Clear to ausculation bilaterally, normal work of breathing Heart- irregular rate and rhythm, no murmurs, rubs or gallops, PMI not laterally displaced GI- soft, NT, ND, + BS Extremities- no clubbing, cyanosis, or edema MS- no significant deformity or atrophy Skin- no rash or lesion Psych- euthymic mood, full affect Neuro- strength and sensation are intact  EKG- atrial fibrillation at 91 bpm, qrs int 132 ms, qtc 487 ms     Assessment and Plan: 1. Atrial fib/flutter with RVR Persistent  X 7 days  Continue  120 mg bid,rates are controlled  She is using 30 mg Cardizem as needed  to control v rates over 100  Will schedule TEE cardioversion as pt feels "miserable" and does not feel she can hold out for another 3-4 weeks   CBC/bmet/INR  2. CHA2DS2VASc score  of at least 4 Continue  Warfarin Will need INR am of cardioversion   3. HTN Stable     Will see one week after cardioversion    Rhonda [name] [facility] [address] [place], [place] [address] [phone]

## 2021-03-08 NOTE — H&P (View-Only) (Signed)
Primary Care Physician: [name], MD Referring Physician: Device clinic Cardiologist: Rhonda [name] is a 87 y.o. female with a h/o paroxysmal afib/flutter, PPM with second degree Mobitz II AV block, CAD, HTN that is in the afib clinic  for pt reporting elevated heart rate since Tuesday pm.. She has taken 2  extra 30 mg diltiazem to no avail controlling her v rates. She usually runs very fast with her atrial flutter.  Ekg shows atrial flutter at 135 bpm. BP is stable. No known trigger. She continues on warfarin with a CHA2DS2VASc score of at least 5.   F/u in afib clinic, 03/08/21. She  is here as she went into afib last week. She feels fatigued in afib. Her heart rates have been controlled . 2x she has taken a 30 mg Cardizem for a HR over 100 bpm.  She is on warfarin and does not want to wait the 4 weeks for the therapeutic warfarin levels. She has had a TEE/CV in the past.Last INR at PCP 8/30 was 2.7.   Today, she denies symptoms of palpitations, chest pain, shortness of breath, orthopnea, PND, lower extremity edema, dizziness, presyncope, syncope, or neurologic sequela. The patient is tolerating medications without difficulties and is otherwise without complaint today.   Past Medical History:  Diagnosis Date   Anemia    Bilateral carotid bruits    Coronary disease    s/p PTCA 1990s   GERD (gastroesophageal reflux disease)    takes Protonix daily   GI bleed    previously with pradaxa   Glaucoma    uses Eye Drops daily   H/O hiatal hernia    Hyperlipidemia    takes Atorvastatin daily   Hypertension    takes Azor and Apresoline daily   LBBB (left bundle branch block)    Pacemaker    MDT Advisa May 2013, Dr. Salena Saner   PAF (paroxysmal atrial fibrillation) (HCC)    PVI ablation May 2013, takes Coumadin daily   Sore throat    since anesthesia in Feb 2015 AND ALLEGERIES   Past Surgical History:  Procedure Laterality Date   atrial fibrillation ablation  11/12/11   PVI  by Dr Johney Frame   ATRIAL FIBRILLATION ABLATION N/A 11/12/2011   Procedure: ATRIAL FIBRILLATION ABLATION;  Surgeon: [name], MD;  Location: [facility] CATH LAB;  Service: Cardiovascular;  Laterality: N/A;   CARDIOVERSION N/A 05/26/2019   Procedure: CARDIOVERSION;  Surgeon: Chilton Si, MD;  Location: [facility] ENDOSCOPY;  Service: Cardiovascular;  Laterality: N/A;   CATARACT EXTRACTION W/ INTRAOCULAR LENS  IMPLANT, BILATERAL     COLONOSCOPY     CORONARY ANGIOPLASTY  1990s   by Dr Aleen Campi   ESOPHAGOGASTRECTOMY     EYE SURGERY     GLAUCOMA SURG LT EYE   KYPHOPLASTY N/A 08/09/2013   Procedure: LUMBAR TWO KYPHOPLASTY;  Surgeon: Barnett Abu, MD;  Location: [facility] NEURO ORS;  Service: Neurosurgery;  Laterality: N/A;  L2 Kyphoplasty   KYPHOPLASTY N/A 09/07/2013   Procedure: L1 KYPHOPLASTY;  Surgeon: Barnett Abu, MD;  Location: [facility] NEURO ORS;  Service: Neurosurgery;  Laterality: N/A;   KYPHOPLASTY N/A 10/04/2013   Procedure: T12 KYPHOPLASTY;  Surgeon: Barnett Abu, MD;  Location: [facility] NEURO ORS;  Service: Neurosurgery;  Laterality: N/A;  T12 KYPHOPLASTY   PACEMAKER INSERTION  11/13/11   MDT implanted by Dr Royann Shivers for 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 observed on EP study   PERMANENT PACEMAKER INSERTION N/A 11/13/2011   Procedure: PERMANENT  PACEMAKER INSERTION;  Surgeon: Mihai Croitoru, MD;  Location: [facility] CATH LAB;  Service: Cardiovascular;  Laterality: N/A;   TEE WITHOUT CARDIOVERSION  11/11/2011   Procedure: TRANSESOPHAGEAL ECHOCARDIOGRAM (TEE);  Surgeon: Kenneth C. Hilty, MD;  Location: [facility] ENDOSCOPY;  Service: Cardiovascular;  Laterality: N/A;   TEE WITHOUT CARDIOVERSION N/A 05/26/2019   Procedure: TRANSESOPHAGEAL ECHOCARDIOGRAM (TEE);  Surgeon: Tracy, Tiffany, MD;  Location: [facility] ENDOSCOPY;  Service: Cardiovascular;  Laterality: N/A;   TEMPORARY PACEMAKER INSERTION  11/12/2011   Procedure: TEMPORARY PACEMAKER INSERTION;  Surgeon: James Allred, MD;  Location: [facility] CATH LAB;  Service: Cardiovascular;;     Current Outpatient Medications  Medication Sig Dispense Refill   atorvastatin (LIPITOR) 40 MG tablet Take 40 mg by mouth every evening.   2   brimonidine (ALPHAGAN P) 0.1 % SOLN Instill 3 drops in the right eye daily     Cholecalciferol (VITAMIN D3) 50 MCG (2000 UT) TABS Take 2,000 Units by mouth daily with lunch.     denosumab (PROLIA) 60 MG/ML SOLN injection Inject 60 mg into the skin every 6 (six) months. Administer in upper arm, thigh, or abdomen     diltiazem (CARDIZEM CD) 120 MG 24 hr capsule Take 1 capsule (120 mg total) by mouth in the morning and at bedtime. 180 capsule 3   diltiazem (CARDIZEM) 30 MG tablet TAKE 1 TABLET BY MOUTH EVERY 4 HOURS AS NEEDED FOR  HEART  RATE  GREATER  THAN  100  AS  LONG  AS  BLOOD  PRESSURE  GREATER  THAN  100 45 tablet 0   dorzolamide (TRUSOPT) 2 % ophthalmic solution Place 1 drop into the right eye 3 (three) times daily.     furosemide (LASIX) 20 MG tablet Take 60 mg by mouth daily.     ketotifen (ZADITOR) 0.025 % ophthalmic solution Place 1 drop into the left eye 2 (two) times daily.     Latanoprostene Bunod (VYZULTA) 0.024 % SOLN Place 1 drop into the right eye at bedtime.      nitroGLYCERIN (NITROSTAT) 0.4 MG SL tablet Place 0.4 mg under the tongue every 5 (five) minutes as needed. For chest pain.     olmesartan (BENICAR) 20 MG tablet 1/2 tablet     warfarin (COUMADIN) 5 MG tablet Take 2.5-5 mg by mouth See admin instructions. Take 1 tablet (5 mg) by mouth on Mondays, Wednesdays, & Fridays. Take 0.5 tablet (2.5 mg) by mouth on Sundays, Tuesdays, Thursdays, & Saturdays.     No current facility-administered medications for this encounter.    Allergies  Allergen Reactions   Azor [Amlodipine-Olmesartan] Other (See Comments)    Higher doses cause ankles to swell   Beta Adrenergic Blockers Other (See Comments)    Bradycardia.   Norvasc [Amlodipine Besylate] Other (See Comments)    edema   Parathyroid Hormone (Recomb)     Other reaction(s):  nausea   Pradaxa [Dabigatran Etexilate Mesylate] Other (See Comments)    GI Bleed   Questran [Cholestyramine] Other (See Comments)    Constipation   Sulfa Drugs Cross Reactors Hives    Social History   Socioeconomic History   Marital status: Married    Spouse name: Not on file   Number of children: Not on file   Years of education: Not on file   Highest education level: Not on file  Occupational History   Not on file  Tobacco Use   Smoking status: Never   Smokeless tobacco: Never  Vaping Use   Vaping Use: Never   used  Substance and Sexual Activity   Alcohol use: No   Drug use: No   Sexual activity: Not Currently    Birth control/protection: Post-menopausal  Other Topics Concern   Not on file  Social History Narrative   Pt lives in [place].  Retired form [employer].   Attends [employer]   Social Determinants of Health   Financial Resource Strain: Not on file  Food Insecurity: Not on file  Transportation Needs: Not on file  Physical Activity: Not on file  Stress: Not on file  Social Connections: Not on file  Intimate Partner Violence: Not on file    Family History  Problem Relation Age of Onset   Heart disease Mother    Heart disease Father    Heart disease Brother    Cancer Maternal Grandmother    Heart disease Maternal Grandfather    Pneumonia Paternal Grandmother    Pneumonia Paternal Grandfather    Heart disease Brother    Heart disease Brother    Pneumonia Sister     ROS- All systems are reviewed and negative except as per the HPI above  Physical Exam: Vitals:   03/08/21 1528  BP: 132/84  Pulse: 91  Weight: 50.4 kg  Height: 5' 4.5" (1.638 m)   Wt Readings from Last 3 Encounters:  03/08/21 50.4 kg  10/19/20 49.8 kg  09/07/20 49.7 kg    Labs: Lab Results  Component Value Date   NA 138 10/19/2020   K 3.8 10/19/2020   CL 102 10/19/2020   CO2 31 10/19/2020   GLUCOSE 106 (H) 10/19/2020   BUN 22 10/19/2020   CREATININE 0.77  10/19/2020   CALCIUM 10.6 (H) 10/19/2020   MG 2.2 09/13/2010   Lab Results  Component Value Date   INR 2.7 (H) 10/19/2020   Lab Results  Component Value Date   CHOL  04/30/2010    149        ATP III CLASSIFICATION:  <200     mg/dL   Desirable  338-250  mg/dL   Borderline High  >=539    mg/dL   High          HDL 71 04/30/2010   LDLCALC  04/30/2010    70        Total Cholesterol/HDL:CHD Risk Coronary Heart Disease Risk Table                     Men   Women  1/2 Average Risk   3.4   3.3  Average Risk       5.0   4.4  2 X Average Risk   9.6   7.1  3 X Average Risk  23.4   11.0        Use the calculated Patient Ratio above and the CHD Risk Table to determine the patient's CHD Risk.        ATP III CLASSIFICATION (LDL):  <100     mg/dL   Optimal  767-341  mg/dL   Near or Above                    Optimal  130-159  mg/dL   Borderline  937-902  mg/dL   High  >409     mg/dL   Very High   TRIG 42 73/53/2992     GEN- The patient is well appearing, alert and oriented x 3 today.   Head- normocephalic, atraumatic Eyes-  Sclera clear, conjunctiva pink Ears-  hearing intact Oropharynx- clear Neck- supple, no JVP Lymph- no cervical lymphadenopathy Lungs- Clear to ausculation bilaterally, normal work of breathing Heart- irregular rate and rhythm, no murmurs, rubs or gallops, PMI not laterally displaced GI- soft, NT, ND, + BS Extremities- no clubbing, cyanosis, or edema MS- no significant deformity or atrophy Skin- no rash or lesion Psych- euthymic mood, full affect Neuro- strength and sensation are intact  EKG- atrial fibrillation at 91 bpm, qrs int 132 ms, qtc 487 ms     Assessment and Plan: 1. Atrial fib/flutter with RVR Persistent  X 7 days  Continue  120 mg bid,rates are controlled  She is using 30 mg Cardizem as needed  to control v rates over 100  Will schedule TEE cardioversion as pt feels "miserable" and does not feel she can hold out for another 3-4 weeks   CBC/bmet/INR  2. CHA2DS2VASc score  of at least 4 Continue  Warfarin Will need INR am of cardioversion   3. HTN Stable     Will see one week after cardioversion    Lupita Leash C. Matthew Fol[facility] [address] [place], [place] [address] [phone]

## 2021-03-08 NOTE — Patient Instructions (Addendum)
Cardioversion scheduled for Wednesday, September 14th  - Have your INR checked prior to arrival at [facility] - bring a copy of the reading with you  - Arrive at the [facility] and go to admitting at 10AM  - Do not eat or drink anything after midnight the night prior to your procedure.  - Take all your morning medication (except diabetic medications) with a sip of water prior to arrival.  - You will not be able to drive home after your procedure.  - Do NOT miss any doses of your blood thinner - if you should miss a dose please notify our office immediately.  - If you feel as if you go back into normal rhythm prior to scheduled cardioversion, please notify our office immediately. If your procedure is canceled in the cardioversion suite you will be charged a cancellation fee.  Patients will be asked to: to mask in public and hand hygiene (no longer quarantine) in the 3 days prior to surgery, to report if any COVID-19-like illness or household contacts to COVID-19 to determine need for testing

## 2021-03-14 ENCOUNTER — Ambulatory Visit (HOSPITAL_COMMUNITY): Payer: Medicare Other | Admitting: Anesthesiology

## 2021-03-14 ENCOUNTER — Encounter (HOSPITAL_COMMUNITY): Payer: Self-pay | Admitting: Internal Medicine

## 2021-03-14 ENCOUNTER — Ambulatory Visit (HOSPITAL_COMMUNITY)
Admission: RE | Admit: 2021-03-14 | Discharge: 2021-03-14 | Disposition: A | Discharge status: Home / Self Care | Payer: Medicare Other | Attending: Internal Medicine | Admitting: Internal Medicine

## 2021-03-14 ENCOUNTER — Other Ambulatory Visit: Payer: Self-pay

## 2021-03-14 ENCOUNTER — Encounter (HOSPITAL_COMMUNITY)
Admission: RE | Disposition: A | Discharge status: Home / Self Care | Payer: Self-pay | Source: Home / Self Care | Attending: Internal Medicine

## 2021-03-14 ENCOUNTER — Ambulatory Visit (HOSPITAL_BASED_OUTPATIENT_CLINIC_OR_DEPARTMENT_OTHER)
Admission: RE | Admit: 2021-03-14 | Discharge: 2021-03-14 | Disposition: A | Discharge status: Home / Self Care | Payer: Medicare Other | Source: Ambulatory Visit | Attending: Nurse Practitioner | Admitting: Nurse Practitioner

## 2021-03-14 DIAGNOSIS — Z8249 Family history of ischemic heart disease and other diseases of the circulatory system: Secondary | ICD-10-CM | POA: Insufficient documentation

## 2021-03-14 DIAGNOSIS — I4819 Other persistent atrial fibrillation: Secondary | ICD-10-CM

## 2021-03-14 DIAGNOSIS — I2510 Atherosclerotic heart disease of native coronary artery without angina pectoris: Secondary | ICD-10-CM | POA: Insufficient documentation

## 2021-03-14 DIAGNOSIS — Z950 Presence of cardiac pacemaker: Secondary | ICD-10-CM | POA: Insufficient documentation

## 2021-03-14 DIAGNOSIS — I441 Atrioventricular block, second degree: Secondary | ICD-10-CM | POA: Insufficient documentation

## 2021-03-14 DIAGNOSIS — I4891 Unspecified atrial fibrillation: Secondary | ICD-10-CM | POA: Insufficient documentation

## 2021-03-14 DIAGNOSIS — I447 Left bundle-branch block, unspecified: Secondary | ICD-10-CM | POA: Insufficient documentation

## 2021-03-14 DIAGNOSIS — E785 Hyperlipidemia, unspecified: Secondary | ICD-10-CM | POA: Insufficient documentation

## 2021-03-14 DIAGNOSIS — I313 Pericardial effusion (noninflammatory): Secondary | ICD-10-CM

## 2021-03-14 DIAGNOSIS — Z888 Allergy status to other drugs, medicaments and biological substances status: Secondary | ICD-10-CM | POA: Insufficient documentation

## 2021-03-14 DIAGNOSIS — Z79899 Other long term (current) drug therapy: Secondary | ICD-10-CM | POA: Diagnosis not present

## 2021-03-14 DIAGNOSIS — D649 Anemia, unspecified: Secondary | ICD-10-CM | POA: Diagnosis not present

## 2021-03-14 DIAGNOSIS — I480 Paroxysmal atrial fibrillation: Secondary | ICD-10-CM | POA: Diagnosis not present

## 2021-03-14 DIAGNOSIS — Z7901 Long term (current) use of anticoagulants: Secondary | ICD-10-CM | POA: Diagnosis not present

## 2021-03-14 DIAGNOSIS — K219 Gastro-esophageal reflux disease without esophagitis: Secondary | ICD-10-CM | POA: Insufficient documentation

## 2021-03-14 DIAGNOSIS — I4892 Unspecified atrial flutter: Secondary | ICD-10-CM | POA: Insufficient documentation

## 2021-03-14 DIAGNOSIS — I10 Essential (primary) hypertension: Secondary | ICD-10-CM | POA: Diagnosis not present

## 2021-03-14 DIAGNOSIS — Z882 Allergy status to sulfonamides status: Secondary | ICD-10-CM | POA: Diagnosis not present

## 2021-03-14 HISTORY — PX: CARDIOVERSION: SHX1299

## 2021-03-14 HISTORY — PX: TEE WITHOUT CARDIOVERSION: SHX5443

## 2021-03-14 SURGERY — ECHOCARDIOGRAM, TRANSESOPHAGEAL
Anesthesia: General

## 2021-03-14 MED ORDER — LIDOCAINE 2% (20 MG/ML) 5 ML SYRINGE
INTRAMUSCULAR | Status: DC | PRN
  Administered 2021-03-14: 60 mg via INTRAVENOUS

## 2021-03-14 MED ORDER — SODIUM CHLORIDE 0.9 % IV SOLN: INTRAVENOUS | Status: DC

## 2021-03-14 MED ORDER — PROPOFOL 500 MG/50ML IV EMUL
INTRAVENOUS | Status: DC | PRN
  Administered 2021-03-14: 100 ug/kg/min via INTRAVENOUS

## 2021-03-14 MED ORDER — PROPOFOL 10 MG/ML IV BOLUS
INTRAVENOUS | Status: DC | PRN
  Administered 2021-03-14: 20 mg via INTRAVENOUS

## 2021-03-14 MED ORDER — BUTAMBEN-TETRACAINE-BENZOCAINE 2-2-14 % EX AERO
INHALATION_SPRAY | CUTANEOUS | Status: DC | PRN
  Administered 2021-03-14: 2 via TOPICAL

## 2021-03-14 NOTE — Discharge Instructions (Signed)
TEE  YOU HAD AN CARDIAC PROCEDURE TODAY: Refer to the procedure report and other information in the discharge instructions given to you for any specific questions about what was found during the examination. If this information does not answer your questions, please call [facility] office at [phone] to clarify.   DIET: Your first meal following the procedure should be a light meal and then it is ok to progress to your normal diet. A half-sandwich or bowl of soup is an example of a good first meal. Heavy or fried foods are harder to digest and may make you feel nauseous or bloated. Drink plenty of fluids but you should avoid alcoholic beverages for 24 hours. If you had a esophageal dilation, please see attached instructions for diet.   ACTIVITY: Your care partner should take you home directly after the procedure. You should plan to take it easy, moving slowly for the rest of the day. You can resume normal activity the day after the procedure however YOU SHOULD NOT DRIVE, use power tools, machinery or perform tasks that involve climbing or major physical exertion for 24 hours (because of the sedation medicines used during the test).   SYMPTOMS TO REPORT IMMEDIATELY: A cardiologist can be reached at any hour. Please call [phone] for any of the following symptoms:  Vomiting of blood or coffee ground material  New, significant abdominal pain  New, significant chest pain or pain under the shoulder blades  Painful or persistently difficult swallowing  New shortness of breath  Black, tarry-looking or red, bloody stools  FOLLOW UP:  Please also call with any specific questions about appointments or follow up tests.  Electrical Cardioversion  Electrical cardioversion is the delivery of a jolt of electricity to restore a normal rhythm to the heart. A rhythm that is too fast or is not regular keeps the heart from pumping well. In this procedure, sticky patches or metal paddles are placed on  the chest to deliver electricity to the heart from a device.  If this information does not answer your questions, please call [facility] office at [phone] to clarify.   Follow these instructions at home: You may have some redness on the skin where the shocks were given.  You may apply over-the-counter hydrocortisone cream or aloe vera to alleviate skin irritation. YOU SHOULD NOT DRIVE, use power tools, machinery or perform tasks that involve climbing or major physical exertion for 24 hours (because of the sedation medicines used during the test).  Take over-the-counter and prescription medicines only as told by your health care provider. Ask your health care provider how to check your pulse. Check it often. Rest for 48 hours after the procedure or as told by your health care provider. Avoid or limit your caffeine use as told by your health care provider. Keep all follow-up visits as told by your health care provider. This is important.  FOLLOW UP:  Please also call with any specific questions about appointments or follow up tests.   

## 2021-03-14 NOTE — Anesthesia Postprocedure Evaluation (Signed)
Anesthesia Post Note  Patient: ARLETTA LUMADUE  Procedure(s) Performed: TRANSESOPHAGEAL ECHOCARDIOGRAM (TEE) CARDIOVERSION     Patient location during evaluation: PACU Anesthesia Type: General Level of consciousness: awake and alert Pain management: pain level controlled Vital Signs Assessment: post-procedure vital signs reviewed and stable Respiratory status: spontaneous breathing, nonlabored ventilation, respiratory function stable and patient connected to nasal cannula oxygen Cardiovascular status: blood pressure returned to baseline and stable Postop Assessment: no apparent nausea or vomiting Anesthetic complications: no   No notable events documented.  Last Vitals:  Vitals:   03/14/21 1130 03/14/21 1140  BP: (!) 105/49 (!) 120/54  Pulse: 63 62  Resp: (!) 22 14  Temp:    SpO2: 91% 98%    Last Pain:  Vitals:   03/14/21 1140  TempSrc:   PainSc: 0-No pain                 Shelton Silvas

## 2021-03-14 NOTE — Anesthesia Preprocedure Evaluation (Addendum)
Anesthesia Evaluation  Patient identified by MRN, date of birth, ID band Patient awake    Reviewed: Allergy & Precautions, NPO status , Patient's Chart, lab work & pertinent test results  Airway Mallampati: II  TM Distance: >3 FB Neck ROM: Full    Dental  (+) Dental Advisory Given, Partial Upper   Pulmonary neg pulmonary ROS,    breath sounds clear to auscultation       Cardiovascular hypertension, + CAD  + dysrhythmias Atrial Fibrillation + pacemaker  Rhythm:Regular Rate:Normal  Echo (2020): 1. Left ventricular ejection fraction, by visual estimation, is 35 to  40%. The left ventricle has normal function. Normal left ventricular size.  There is no left ventricular hypertrophy.  2. The left ventricle demonstrates global hypokinesis.  3. Left ventricular function moderately reduced. However, she was  persistently tachycardic and this likely underestimates true LV function.  Recommend transthoracic echo once in sinus rhythm to better assess LV  function and the aortic valve.  4. Global right ventricle has mildly reduced systolic function.The right  ventricular size is normal. No increase in right ventricular wall  thickness.  5. Left atrial size was moderately dilated.  6. Right atrial size was normal.  7. The mitral valve is normal in structure. Trace mitral valve  regurgitation. No evidence of mitral stenosis.  8. The tricuspid valve is normal in structure. Tricuspid valve  regurgitation is mild.  9. The aortic valve is tricuspid. Aortic valve regurgitation is not  visualized. Mild aortic valve stenosis.  10. The pulmonic valve was normal in structure. Pulmonic valve  regurgitation is not visualized.  11. A pacer wire is visualized.  12. The inferior vena cava is normal in size with greater than 50%  respiratory variability, suggesting right atrial pressure of 3 mmHg.    Neuro/Psych negative neurological ROS   negative psych ROS   GI/Hepatic Neg liver ROS, hiatal hernia, GERD  ,  Endo/Other  negative endocrine ROS  Renal/GU negative Renal ROS  negative genitourinary   Musculoskeletal negative musculoskeletal ROS (+)   Abdominal Normal abdominal exam  (+)   Peds  Hematology negative hematology ROS (+)   Anesthesia Other Findings   Reproductive/Obstetrics                            Anesthesia Physical Anesthesia Plan  ASA: 3  Anesthesia Plan: General   Post-op Pain Management:    Induction: Intravenous  PONV Risk Score and Plan: 0 and Propofol infusion  Airway Management Planned: Natural Airway and Simple Face Mask  Additional Equipment: None  Intra-op Plan:   Post-operative Plan:   Informed Consent: I have reviewed the patients History and Physical, chart, labs and discussed the procedure including the risks, benefits and alternatives for the proposed anesthesia with the patient or authorized representative who has indicated his/her understanding and acceptance.       Plan Discussed with: CRNA  Anesthesia Plan Comments:        Anesthesia Quick Evaluation

## 2021-03-14 NOTE — CV Procedure (Signed)
INDICATIONS: Afib  PROCEDURE:   Informed consent was obtained prior to the procedure. The risks, benefits and alternatives for the procedure were discussed and the patient comprehended these risks.  Risks include, but are not limited to, cough, sore throat, vomiting, nausea, somnolence, esophageal and stomach trauma or perforation, bleeding, low blood pressure, aspiration, pneumonia, infection, trauma to the teeth and death.    After a procedural time-out, the oropharynx was anesthetized with 20% benzocaine spray.   During this procedure the patient was administered propofol per anesthesia.  The patient's heart rate, blood pressure, and oxygen saturation were monitored continuously during the procedure. The period of conscious sedation was 20 minutes, of which I was present face-to-face 100% of this time.  The transesophageal probe was inserted in the esophagus and stomach without difficulty and multiple views were obtained.  The patient was kept under observation until the patient left the procedure room.  The patient left the procedure room in stable condition.   Agitated microbubble saline contrast was not administered.  COMPLICATIONS:    There were no immediate complications.  FINDINGS:   FORMAL ECHOCARDIOGRAM REPORT PENDING Limited images due to patient respiratory instability.  No LA appendage or LV apical thrombus.  RECOMMENDATIONS:    Proceed to cardioversion  Procedure: Electrical Cardioversion Indications:  Atrial Fibrillation  Procedure Details:  Consent: Risks of procedure as well as the alternatives and risks of each were explained to the (patient/caregiver).  Consent for procedure obtained.  Time Out: Verified patient identification, verified procedure, site/side was marked, verified correct patient position, special equipment/implants available, medications/allergies/relevent history reviewed, required imaging and test results available. PERFORMED.  Patient placed  on cardiac monitor, pulse oximetry, supplemental oxygen as necessary.  Sedation given:  propofol per anesthesia Pacer pads placed anterior and posterior chest.  Cardioverted 1 time(s).  Cardioversion with synchronized biphasic 120J shock.  Evaluation: Findings: Post procedure EKG shows: NSR Complications: None Patient did tolerate procedure well.  Time Spent Directly with the Patient:  30 minutes   Parke Poisson 03/14/2021, 11:06 AM

## 2021-03-14 NOTE — Transfer of Care (Signed)
Immediate Anesthesia Transfer of Care Note  Patient: Rhonda [name]  Procedure(s) Performed: TRANSESOPHAGEAL ECHOCARDIOGRAM (TEE) CARDIOVERSION  Patient Location: Endoscopy Unit  Anesthesia Type:MAC  Level of Consciousness: awake  Airway & Oxygen Therapy: Patient Spontanous Breathing  Post-op Assessment: Report given to RN and Post -op Vital signs reviewed and stable  Post vital signs: Reviewed and stable  Last Vitals:  Vitals Value Taken Time  BP 80/47 03/14/21 1111  Temp    Pulse 70 03/14/21 1112  Resp 24 03/14/21 1112  SpO2 96 % 03/14/21 1112  Vitals shown include unvalidated device data.  Last Pain:  Vitals:   03/14/21 1010  TempSrc: Temporal  PainSc: 0-No pain         Complications: No notable events documented.

## 2021-03-14 NOTE — Anesthesia Procedure Notes (Signed)
Procedure Name: MAC Date/Time: 03/14/2021 10:58 AM Performed by: Lieutenant Diego, CRNA Pre-anesthesia Checklist: Patient identified, Emergency Drugs available, Suction available and Patient being monitored Patient Re-evaluated:Patient Re-evaluated prior to induction Oxygen Delivery Method: Nasal cannula Preoxygenation: Pre-oxygenation with 100% oxygen Induction Type: IV induction Placement Confirmation: positive ETCO2 Dental Injury: Teeth and Oropharynx as per pre-operative assessment

## 2021-03-14 NOTE — Interval H&P Note (Signed)
History and Physical Interval Note:  03/14/2021 10:36 AM  Rhonda [name]  has presented today for surgery, with the diagnosis of A-FIB.  The various methods of treatment have been discussed with the patient and family. After consideration of risks, benefits and other options for treatment, the patient has consented to  Procedure(s): TRANSESOPHAGEAL ECHOCARDIOGRAM (TEE) (N/A) CARDIOVERSION (N/A) as a surgical intervention.  The patient's history has been reviewed, patient examined, no change in status, stable for surgery.  I have reviewed the patient's chart and labs.  Questions were answered to the patient's satisfaction.     Parke Poisson

## 2021-03-21 ENCOUNTER — Other Ambulatory Visit: Payer: Self-pay

## 2021-03-21 ENCOUNTER — Ambulatory Visit (HOSPITAL_COMMUNITY)
Admission: RE | Admit: 2021-03-21 | Discharge: 2021-03-21 | Disposition: A | Discharge status: Home / Self Care | Payer: Medicare Other | Source: Ambulatory Visit | Attending: Nurse Practitioner | Admitting: Nurse Practitioner

## 2021-03-21 VITALS — BP 198/84 | HR 75 | Ht 64.0 in | Wt 111.0 lb

## 2021-03-21 DIAGNOSIS — R609 Edema, unspecified: Secondary | ICD-10-CM

## 2021-03-21 DIAGNOSIS — I4819 Other persistent atrial fibrillation: Secondary | ICD-10-CM | POA: Diagnosis not present

## 2021-03-21 DIAGNOSIS — I4891 Unspecified atrial fibrillation: Secondary | ICD-10-CM | POA: Diagnosis not present

## 2021-03-21 DIAGNOSIS — Z79899 Other long term (current) drug therapy: Secondary | ICD-10-CM | POA: Diagnosis not present

## 2021-03-21 DIAGNOSIS — I10 Essential (primary) hypertension: Secondary | ICD-10-CM | POA: Diagnosis not present

## 2021-03-21 DIAGNOSIS — I2510 Atherosclerotic heart disease of native coronary artery without angina pectoris: Secondary | ICD-10-CM | POA: Diagnosis not present

## 2021-03-21 DIAGNOSIS — I441 Atrioventricular block, second degree: Secondary | ICD-10-CM | POA: Diagnosis not present

## 2021-03-21 DIAGNOSIS — Z950 Presence of cardiac pacemaker: Secondary | ICD-10-CM | POA: Insufficient documentation

## 2021-03-21 DIAGNOSIS — R600 Localized edema: Secondary | ICD-10-CM | POA: Insufficient documentation

## 2021-03-21 DIAGNOSIS — D6869 Other thrombophilia: Secondary | ICD-10-CM | POA: Diagnosis not present

## 2021-03-21 DIAGNOSIS — Z7901 Long term (current) use of anticoagulants: Secondary | ICD-10-CM | POA: Insufficient documentation

## 2021-03-21 NOTE — Progress Notes (Signed)
Primary Care Physician: [name], MD Referring Physician: Device clinic Cardiologist: Dr. Vira Agar is a 87 y.o. female with a h/o paroxysmal afib/flutter, PPM with second degree Mobitz II AV block, CAD, HTN that is in the afib clinic  for pt reporting elevated heart rate since Tuesday pm.. She has taken 2  extra 30 mg diltiazem to no avail controlling her v rates. She usually runs very fast with her atrial flutter.  Ekg shows atrial flutter at 135 bpm. BP is stable. No known trigger. She continues on warfarin with a CHA2DS2VASc score of at least 5.   F/u in afib clinic, 03/08/21. She  is here as she went into afib last week. She feels fatigued in afib. Her heart rates have been controlled . 2x she has taken a 30 mg Cardizem for a HR over 100 bpm.  She is on warfarin and does not want to wait the 4 weeks for the therapeutic warfarin levels. She has had a TEE/CV in the past.Last INR at PCP 8/30 was 2.7.   F/u in afib clinic, 03/21/21. She had a successful TEE guided cardioversion. She had some respiratory difficulty with TEE  limited  images were performed. She remains in SR today.Although her weight is stable, she has noted swollen feet for the last few days. She is taking 60 mg lasix but at 4 pm daily and is c/o of getting up often at night. Will increase x 3 days and move to am. Her BP is also elevated.  Today, she denies symptoms of palpitations, chest pain, shortness of breath, orthopnea, PND, lower extremity edema, dizziness, presyncope, syncope, or neurologic sequela. The patient is tolerating medications without difficulties and is otherwise without complaint today.   Past Medical History:  Diagnosis Date   Anemia    Bilateral carotid bruits    Coronary disease    s/p PTCA 1990s   GERD (gastroesophageal reflux disease)    takes Protonix daily   GI bleed    previously with pradaxa   Glaucoma    uses Eye Drops daily   H/O hiatal hernia    Hyperlipidemia    takes  Atorvastatin daily   Hypertension    takes Azor and Apresoline daily   LBBB (left bundle branch block)    Pacemaker    MDT Advisa May 2013, Dr. Salena Saner   PAF (paroxysmal atrial fibrillation) (HCC)    PVI ablation May 2013, takes Coumadin daily   Sore throat    since anesthesia in Feb 2015 AND ALLEGERIES   Past Surgical History:  Procedure Laterality Date   atrial fibrillation ablation  11/12/11   PVI by Dr Johney Frame   ATRIAL FIBRILLATION ABLATION N/A 11/12/2011   Procedure: ATRIAL FIBRILLATION ABLATION;  Surgeon: [name], MD;  Location: [facility] CATH LAB;  Service: Cardiovascular;  Laterality: N/A;   CARDIOVERSION N/A 05/26/2019   Procedure: CARDIOVERSION;  Surgeon: Chilton Si, MD;  Location: [facility] ENDOSCOPY;  Service: Cardiovascular;  Laterality: N/A;   CARDIOVERSION N/A 03/14/2021   Procedure: CARDIOVERSION;  Surgeon: Parke Poisson, MD;  Location: [facility] ENDOSCOPY;  Service: Cardiovascular;  Laterality: N/A;   CATARACT EXTRACTION W/ INTRAOCULAR LENS  IMPLANT, BILATERAL     COLONOSCOPY     CORONARY ANGIOPLASTY  1990s   by Dr Aleen Campi   ESOPHAGOGASTRECTOMY     EYE SURGERY     GLAUCOMA SURG LT EYE   KYPHOPLASTY N/A 08/09/2013   Procedure: LUMBAR TWO KYPHOPLASTY;  Surgeon: Barnett Abu, MD;  Location: [facility] NEURO ORS;  Service: Neurosurgery;  Laterality: N/A;  L2 Kyphoplasty   KYPHOPLASTY N/A 09/07/2013   Procedure: L1 KYPHOPLASTY;  Surgeon: Barnett Abu, MD;  Location: [facility] NEURO ORS;  Service: Neurosurgery;  Laterality: N/A;   KYPHOPLASTY N/A 10/04/2013   Procedure: T12 KYPHOPLASTY;  Surgeon: Barnett Abu, MD;  Location: [facility] NEURO ORS;  Service: Neurosurgery;  Laterality: N/A;  T12 KYPHOPLASTY   PACEMAKER INSERTION  11/13/11   MDT implanted by Dr Royann Shivers for 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 observed on EP study   PERMANENT PACEMAKER INSERTION N/A 11/13/2011   Procedure: PERMANENT PACEMAKER INSERTION;  Surgeon: Thurmon Fair, MD;  Location: [facility] CATH LAB;  Service: Cardiovascular;  Laterality:  N/A;   TEE WITHOUT CARDIOVERSION  11/11/2011   Procedure: TRANSESOPHAGEAL ECHOCARDIOGRAM (TEE);  Surgeon: Chrystie Nose, MD;  Location: [facility] ENDOSCOPY;  Service: Cardiovascular;  Laterality: N/A;   TEE WITHOUT CARDIOVERSION N/A 05/26/2019   Procedure: TRANSESOPHAGEAL ECHOCARDIOGRAM (TEE);  Surgeon: Chilton Si, MD;  Location: [facility] ENDOSCOPY;  Service: Cardiovascular;  Laterality: N/A;   TEE WITHOUT CARDIOVERSION N/A 03/14/2021   Procedure: TRANSESOPHAGEAL ECHOCARDIOGRAM (TEE);  Surgeon: Parke Poisson, MD;  Location: [facility] ENDOSCOPY;  Service: Cardiovascular;  Laterality: N/A;   TEMPORARY PACEMAKER INSERTION  11/12/2011   Procedure: TEMPORARY PACEMAKER INSERTION;  Surgeon: [name], MD;  Location: [facility] CATH LAB;  Service: Cardiovascular;;    Current Outpatient Medications  Medication Sig Dispense Refill   atorvastatin (LIPITOR) 40 MG tablet Take 40 mg by mouth every evening.   2   brimonidine (ALPHAGAN P) 0.1 % SOLN Place 1 drop into the right eye in the morning, at noon, and at bedtime.     Cholecalciferol (VITAMIN D3) 50 MCG (2000 UT) TABS Take 2,000 Units by mouth daily with lunch.     denosumab (PROLIA) 60 MG/ML SOLN injection Inject 60 mg into the skin every 6 (six) months. Administer in upper arm, thigh, or abdomen     diltiazem (CARDIZEM CD) 120 MG 24 hr capsule Take 1 capsule (120 mg total) by mouth in the morning and at bedtime. 180 capsule 3   diltiazem (CARDIZEM) 30 MG tablet TAKE 1 TABLET BY MOUTH EVERY 4 HOURS AS NEEDED FOR  HEART  RATE  GREATER  THAN  100  AS  LONG  AS  BLOOD  PRESSURE  GREATER  THAN  100 45 tablet 0   dorzolamide (TRUSOPT) 2 % ophthalmic solution Place 1 drop into the right eye 3 (three) times daily.     furosemide (LASIX) 20 MG tablet Take 60 mg by mouth daily.     ketotifen (ZADITOR) 0.025 % ophthalmic solution Place 1 drop into the left eye 2 (two) times daily.     Latanoprostene Bunod (VYZULTA) 0.024 % SOLN Place 1 drop into the right eye at bedtime.       lidocaine (LMX) 4 % cream Apply 1 application topically daily.     nitroGLYCERIN (NITROSTAT) 0.4 MG SL tablet Place 0.4 mg under the tongue every 5 (five) minutes as needed. For chest pain.     olmesartan (BENICAR) 20 MG tablet Take 10 mg by mouth daily as needed (sbp over 140).     warfarin (COUMADIN) 5 MG tablet Take 2.5-5 mg by mouth See admin instructions. Take 1 tablet (5 mg) by mouth on Mondays, Wednesdays, & Fridays. Take 0.5 tablet (2.5 mg) by mouth on Sundays, Tuesdays, Thursdays, & Saturdays.     No current facility-administered medications for this encounter.  Allergies  Allergen Reactions   Azor [Amlodipine-Olmesartan] Other (See Comments)    Higher doses cause ankles to swell   Beta Adrenergic Blockers Other (See Comments)    Bradycardia.   Dabigatran Etexilate Mesylate Other (See Comments)   Norvasc [Amlodipine Besylate] Other (See Comments)    edema   Parathyroid Hormone (Recomb) Nausea And Vomiting    Other reaction(s): nausea   Pradaxa [Dabigatran Etexilate Mesylate] Other (See Comments)    GI Bleed   Questran [Cholestyramine] Other (See Comments)    Constipation   Sulfa Drugs Cross Reactors Hives   Sulfacetamide Sod-Sulfur Wash [Sulfacet-Sulfur Wash &Cleanser] Other (See Comments)    Social History   Socioeconomic History   Marital status: Married    Spouse name: Not on file   Number of children: Not on file   Years of education: Not on file   Highest education level: Not on file  Occupational History   Not on file  Tobacco Use   Smoking status: Never   Smokeless tobacco: Never  Vaping Use   Vaping Use: Never used  Substance and Sexual Activity   Alcohol use: No   Drug use: No   Sexual activity: Not Currently    Birth control/protection: Post-menopausal  Other Topics Concern   Not on file  Social History Narrative   Pt lives in [place].  Retired form [employer].   Attends [employer]   Social Determinants of Health   Financial  Resource Strain: Not on file  Food Insecurity: Not on file  Transportation Needs: Not on file  Physical Activity: Not on file  Stress: Not on file  Social Connections: Not on file  Intimate Partner Violence: Not on file    Family History  Problem Relation Age of Onset   Heart disease Mother    Heart disease Father    Heart disease Brother    Cancer Maternal Grandmother    Heart disease Maternal Grandfather    Pneumonia Paternal Grandmother    Pneumonia Paternal Grandfather    Heart disease Brother    Heart disease Brother    Pneumonia Sister     ROS- All systems are reviewed and negative except as per the HPI above  Physical Exam: Vitals:   03/21/21 1410  BP: (!) 198/84  Pulse: 75  Weight: 50.3 kg  Height: 5\' 4"  (1.626 m)   Wt Readings from Last 3 Encounters:  03/21/21 50.3 kg  03/14/21 50.3 kg  03/08/21 50.4 kg    Labs: Lab Results  Component Value Date   NA 138 03/08/2021   K 4.3 03/08/2021   CL 104 03/08/2021   CO2 28 03/08/2021   GLUCOSE 116 (H) 03/08/2021   BUN 20 03/08/2021   CREATININE 0.68 03/08/2021   CALCIUM 10.4 (H) 03/08/2021   MG 2.2 09/13/2010   Lab Results  Component Value Date   INR 2.9 (H) 03/08/2021   Lab Results  Component Value Date   CHOL  04/30/2010    149        ATP III CLASSIFICATION:  <200     mg/dL   Desirable  05/02/2010  mg/dL   Borderline High  130-865    mg/dL   High          HDL 71 04/30/2010   LDLCALC  04/30/2010    70        Total Cholesterol/HDL:CHD Risk Coronary Heart Disease Risk Table  Men   Women  1/2 Average Risk   3.4   3.3  Average Risk       5.0   4.4  2 X Average Risk   9.6   7.1  3 X Average Risk  23.4   11.0        Use the calculated Patient Ratio above and the CHD Risk Table to determine the patient's CHD Risk.        ATP III CLASSIFICATION (LDL):  <100     mg/dL   Optimal  544-920  mg/dL   Near or Above                    Optimal  130-159  mg/dL   Borderline  100-712   mg/dL   High  >197     mg/dL   Very High   TRIG 42 58/83/2549     GEN- The patient is well appearing, alert and oriented x 3 today.   Head- normocephalic, atraumatic Eyes-  Sclera clear, conjunctiva pink Ears- hearing intact Oropharynx- clear Neck- supple, no JVP Lymph- no cervical lymphadenopathy Lungs- Clear to ausculation bilaterally, normal work of breathing Heart-regular rate and rhythm, no murmurs, rubs or gallops, PMI not laterally displaced GI- soft, NT, ND, + BS Extremities- no clubbing, cyanosis, or +  edema bilateral feet and ankles MS- no significant deformity or atrophy Skin- no rash or lesion Psych- euthymic mood, full affect Neuro- strength and sensation are intact  EKG- NSR at 75 bpm, pr int 156 ms, qrs int 136, qtc 466 ms  TEE echo- 1. Very limited images obtained due to patient's respiratory difficulty.  Focused exam performed.   2. Left ventricular ejection fraction, by estimation, is 30 to 35%. The  left ventricle has moderately decreased function.   3. Left atrial size was mild to moderately dilated. No left atrial/left  atrial appendage thrombus was detected.   4. A small pericardial effusion is present.   5. The mitral valve is grossly normal. Trivial mitral valve  regurgitation.   6. Aortic valve regurgitation is not visualized.   Conclusion(s)/Recommendation(s): No LA/LAA thrombus identified. Successful  cardioversion performed with restoration of normal sinus rhythm.   Assessment and Plan: 1. Atrial fib/flutter with RVR Persistent  X almost 3 weeks   Successful CV and remains in SR today  Continue  120 mg bid  She uses 30 mg Cardizem as needed  to control v rates    2. CHA2DS2VASc score  of at least 4 Continue  Warfarin  3. HTN Elevated today   4. Bilateral feet swelling  With BP being up and feet swollen, I feels she has retained some fluid with being out of rhythm for several weeks  I will increase lasix to 80 mg daily x 3 days, she will  call office in a few days to report status of edema and BP    Will see as needed   Lupita Leash C. [facility] [address] [place], [place] [address] [phone]

## 2021-03-21 NOTE — Patient Instructions (Signed)
Lasix (furosemide) Increase to 80mg  a day for up to 3 days then reduce back to 60mg  - call with update of your swelling on Monday

## 2021-03-22 ENCOUNTER — Encounter (HOSPITAL_COMMUNITY): Payer: Self-pay | Admitting: Nurse Practitioner

## 2021-03-22 ENCOUNTER — Ambulatory Visit (INDEPENDENT_AMBULATORY_CARE_PROVIDER_SITE_OTHER): Payer: Medicare Other

## 2021-03-22 DIAGNOSIS — I441 Atrioventricular block, second degree: Secondary | ICD-10-CM

## 2021-03-23 LAB — CUP PACEART REMOTE DEVICE CHECK
Battery Impedance: 1670 Ohm
Battery Remaining Longevity: 43 mo
Battery Voltage: 2.75 V
Brady Statistic AP VP Percent: 1 %
Brady Statistic AP VS Percent: 62 %
Brady Statistic AS VP Percent: 1 %
Brady Statistic AS VS Percent: 36 %
Date Time Interrogation Session: 20220922102954
Implantable Lead Implant Date: 20130515
Implantable Lead Implant Date: 20130515
Implantable Lead Location: 753859
Implantable Lead Location: 753860
Implantable Lead Model: 5076
Implantable Lead Model: 5076
Implantable Pulse Generator Implant Date: 20130515
Lead Channel Impedance Value: 387 Ohm
Lead Channel Impedance Value: 698 Ohm
Lead Channel Pacing Threshold Amplitude: 0.5 V
Lead Channel Pacing Threshold Amplitude: 1.125 V
Lead Channel Pacing Threshold Pulse Width: 0.4 ms
Lead Channel Pacing Threshold Pulse Width: 0.4 ms
Lead Channel Setting Pacing Amplitude: 2 V
Lead Channel Setting Pacing Amplitude: 2.5 V
Lead Channel Setting Pacing Pulse Width: 0.4 ms
Lead Channel Setting Sensing Sensitivity: 5.6 mV

## 2021-03-26 ENCOUNTER — Telehealth (HOSPITAL_COMMUNITY): Payer: Self-pay | Admitting: *Deleted

## 2021-03-26 NOTE — Telephone Encounter (Signed)
Patient reports swelling is gone and her BP has been running 130s/70s all weekend. Pt will call if issues.

## 2021-03-29 NOTE — Progress Notes (Signed)
Remote pacemaker transmission.   

## 2021-04-03 DIAGNOSIS — I10 Essential (primary) hypertension: Secondary | ICD-10-CM | POA: Diagnosis not present

## 2021-04-03 DIAGNOSIS — Z7901 Long term (current) use of anticoagulants: Secondary | ICD-10-CM | POA: Diagnosis not present

## 2021-04-03 DIAGNOSIS — I4891 Unspecified atrial fibrillation: Secondary | ICD-10-CM | POA: Diagnosis not present

## 2021-04-18 DIAGNOSIS — L821 Other seborrheic keratosis: Secondary | ICD-10-CM | POA: Diagnosis not present

## 2021-04-18 DIAGNOSIS — D492 Neoplasm of unspecified behavior of bone, soft tissue, and skin: Secondary | ICD-10-CM | POA: Diagnosis not present

## 2021-04-18 DIAGNOSIS — C44719 Basal cell carcinoma of skin of left lower limb, including hip: Secondary | ICD-10-CM | POA: Diagnosis not present

## 2021-04-18 DIAGNOSIS — C44729 Squamous cell carcinoma of skin of left lower limb, including hip: Secondary | ICD-10-CM | POA: Diagnosis not present

## 2021-05-02 ENCOUNTER — Telehealth: Payer: Self-pay | Admitting: Physician Assistant

## 2021-05-02 ENCOUNTER — Encounter (HOSPITAL_COMMUNITY): Payer: Self-pay | Admitting: Nurse Practitioner

## 2021-05-02 ENCOUNTER — Other Ambulatory Visit: Payer: Self-pay

## 2021-05-02 ENCOUNTER — Ambulatory Visit (HOSPITAL_COMMUNITY)
Admission: RE | Admit: 2021-05-02 | Discharge: 2021-05-02 | Disposition: A | Discharge status: Home / Self Care | Payer: Medicare Other | Source: Ambulatory Visit | Attending: Nurse Practitioner | Admitting: Nurse Practitioner

## 2021-05-02 VITALS — BP 180/68 | HR 119 | Ht 64.0 in | Wt 107.8 lb

## 2021-05-02 DIAGNOSIS — I10 Essential (primary) hypertension: Secondary | ICD-10-CM | POA: Insufficient documentation

## 2021-05-02 DIAGNOSIS — D6869 Other thrombophilia: Secondary | ICD-10-CM

## 2021-05-02 DIAGNOSIS — Z7901 Long term (current) use of anticoagulants: Secondary | ICD-10-CM | POA: Insufficient documentation

## 2021-05-02 DIAGNOSIS — I4892 Unspecified atrial flutter: Secondary | ICD-10-CM | POA: Diagnosis not present

## 2021-05-02 DIAGNOSIS — Z8249 Family history of ischemic heart disease and other diseases of the circulatory system: Secondary | ICD-10-CM | POA: Insufficient documentation

## 2021-05-02 DIAGNOSIS — I4891 Unspecified atrial fibrillation: Secondary | ICD-10-CM | POA: Insufficient documentation

## 2021-05-02 LAB — COMPREHENSIVE METABOLIC PANEL
ALT: 21 U/L (ref 0–44)
AST: 25 U/L (ref 15–41)
Albumin: 3.9 g/dL (ref 3.5–5.0)
Alkaline Phosphatase: 68 U/L (ref 38–126)
Anion gap: 7 (ref 5–15)
BUN: 18 mg/dL (ref 8–23)
CO2: 26 mmol/L (ref 22–32)
Calcium: 10 mg/dL (ref 8.9–10.3)
Chloride: 104 mmol/L (ref 98–111)
Creatinine, Ser: 0.62 mg/dL (ref 0.44–1.00)
GFR, Estimated: >60 mL/min (ref >60)
Glucose, Bld: 96 mg/dL (ref 70–99)
Potassium: 3.8 mmol/L (ref 3.5–5.1)
Sodium: 137 mmol/L (ref 135–145)
Total Bilirubin: 0.6 mg/dL (ref 0.3–1.2)
Total Protein: 7.3 g/dL (ref 6.5–8.1)

## 2021-05-02 LAB — PROTIME-INR
INR: 2.5 — ABNORMAL HIGH (ref 0.8–1.2)
Prothrombin Time: 26.8 seconds — ABNORMAL HIGH (ref 11.4–15.2)

## 2021-05-02 LAB — TSH: TSH: 1.495 u[IU]/mL (ref 0.350–4.500)

## 2021-05-02 MED ORDER — AMIODARONE HCL 200 MG PO TABS: ORAL_TABLET | ORAL | 0 refills | Status: DC

## 2021-05-02 NOTE — Addendum Note (Signed)
Encounter addended by: Learta Codding, CMA on: 05/02/2021 4:58 PM  Actions taken: Order list changed

## 2021-05-02 NOTE — Patient Instructions (Signed)
Start Amiodarone 200mg  twice a day with food

## 2021-05-02 NOTE — Progress Notes (Signed)
Primary Care Physician: Deland Pretty, MD Referring Physician: Device clinic Cardiologist: Dr. Jerilee Hoh is a 87 y.o. female with a h/o paroxysmal afib/flutter, PPM with second degree Mobitz II AV block, CAD, HTN that is in the afib clinic  for pt reporting elevated heart rate since Tuesday pm.. She has taken 2  extra 30 mg diltiazem to no avail controlling her v rates. She usually runs very fast with her atrial flutter.  Ekg shows atrial flutter at 135 bpm. BP is stable. No known trigger. She continues on warfarin with a CHA2DS2VASc score of at least 5.   F/u in afib clinic, 03/08/21. She  is here as she went into afib last week. She feels fatigued in afib. Her heart rates have been controlled . 2x she has taken a 30 mg Cardizem for a HR over 100 bpm.  She is on warfarin and does not want to wait the 4 weeks for the therapeutic warfarin levels. She has had a TEE/CV in the past.Last INR at PCP 8/30 was 2.7.   F/u in afib clinic, 03/21/21. She had a successful TEE guided cardioversion. She had some respiratory difficulty with TEE  limited  images were performed. She remains in SR today.Although her weight is stable, she has noted swollen feet for the last few days. She is taking 60 mg lasix but at 4 pm daily and is c/o of getting up often at night. Will increase x 3 days and move to am. Her BP is also elevated.  F/u afib clinic, 05/02/22.  She called this am as she has been out of rhythm since yesterday with RVR despite takeing cardizxem 30 mg tabs. .EKG reads sinus tach at 120 bpm, but I suspect atrial flutter or atrial tach.   She just had a cardioversion in September so antiarrythmic's are discussed today. I discussed use of amiodarone with her to help maintain SR. Risk vrs benefit of drug discussed and pt would like to proceed. She  has a TEE in September, negative for thrombus  and last INR 10/7 was 2.7. She has coumadin followed at [facility].   Today, she denies symptoms of  palpitations, chest pain, shortness of breath, orthopnea, PND, lower extremity edema, dizziness, presyncope, syncope, or neurologic sequela. The patient is tolerating medications without difficulties and is otherwise without complaint today.   Past Medical History:  Diagnosis Date   Anemia    Bilateral carotid bruits    Coronary disease    s/p PTCA 1990s   GERD (gastroesophageal reflux disease)    takes Protonix daily   GI bleed    previously with pradaxa   Glaucoma    uses Eye Drops daily   H/O hiatal hernia    Hyperlipidemia    takes Atorvastatin daily   Hypertension    takes Azor and Apresoline daily   LBBB (left bundle branch block)    Pacemaker    MDT Advisa May 2013, Dr. Loletha Grayer   PAF (paroxysmal atrial fibrillation) ([place])    PVI ablation May 2013, takes Coumadin daily   Sore throat    since anesthesia in Feb 2015 [place]   Past Surgical History:  Procedure Laterality Date   atrial fibrillation ablation  11/12/11   PVI by Dr Rayann Heman   ATRIAL FIBRILLATION ABLATION N/A 11/12/2011   Procedure: ATRIAL FIBRILLATION ABLATION;  Surgeon: Thompson Grayer, MD;  Location: [facility] CATH LAB;  Service: Cardiovascular;  Laterality: N/A;   CARDIOVERSION N/A 05/26/2019  Procedure: CARDIOVERSION;  Surgeon: Skeet Latch, MD;  Location: [facility] ENDOSCOPY;  Service: Cardiovascular;  Laterality: N/A;   CARDIOVERSION N/A 03/14/2021   Procedure: CARDIOVERSION;  Surgeon: Elouise Munroe, MD;  Location: [facility] ENDOSCOPY;  Service: Cardiovascular;  Laterality: N/A;   CATARACT EXTRACTION W/ INTRAOCULAR LENS  IMPLANT, BILATERAL     COLONOSCOPY     CORONARY ANGIOPLASTY  1990s   by Dr Glade Lloyd   ESOPHAGOGASTRECTOMY     EYE SURGERY     GLAUCOMA SURG LT EYE   KYPHOPLASTY N/A 08/09/2013   Procedure: LUMBAR TWO KYPHOPLASTY;  Surgeon: Kristeen Miss, MD;  Location: [place] NEURO ORS;  Service: Neurosurgery;  Laterality: N/A;  L2 Kyphoplasty   KYPHOPLASTY N/A 09/07/2013   Procedure: L1 KYPHOPLASTY;  Surgeon: Kristeen Miss, MD;  Location: [place] NEURO ORS;  Service: Neurosurgery;  Laterality: N/A;   KYPHOPLASTY N/A 10/04/2013   Procedure: T12 KYPHOPLASTY;  Surgeon: Kristeen Miss, MD;  Location: [place] NEURO ORS;  Service: Neurosurgery;  Laterality: N/A;  T12 KYPHOPLASTY   PACEMAKER INSERTION  11/13/11   MDT implanted by Dr Sallyanne Kuster for 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 observed on EP study   PERMANENT PACEMAKER INSERTION N/A 11/13/2011   Procedure: Victoria Vera;  Surgeon: Sanda Klein, MD;  Location: [place] CATH LAB;  Service: Cardiovascular;  Laterality: N/A;   TEE WITHOUT CARDIOVERSION  11/11/2011   Procedure: TRANSESOPHAGEAL ECHOCARDIOGRAM (TEE);  Surgeon: Pixie Casino, MD;  Location: [facility] ENDOSCOPY;  Service: Cardiovascular;  Laterality: N/A;   TEE WITHOUT CARDIOVERSION N/A 05/26/2019   Procedure: TRANSESOPHAGEAL ECHOCARDIOGRAM (TEE);  Surgeon: Skeet Latch, MD;  Location: [place];  Service: Cardiovascular;  Laterality: N/A;   TEE WITHOUT CARDIOVERSION N/A 03/14/2021   Procedure: TRANSESOPHAGEAL ECHOCARDIOGRAM (TEE);  Surgeon: Elouise Munroe, MD;  Location: [facility] ENDOSCOPY;  Service: Cardiovascular;  Laterality: N/A;   TEMPORARY PACEMAKER INSERTION  11/12/2011   Procedure: TEMPORARY PACEMAKER INSERTION;  Surgeon: Thompson Grayer, MD;  Location: [facility] CATH LAB;  Service: Cardiovascular;;    Current Outpatient Medications  Medication Sig Dispense Refill   atorvastatin (LIPITOR) 40 MG tablet Take 40 mg by mouth every evening.   2   brimonidine (ALPHAGAN P) 0.1 % SOLN Place 1 drop into the right eye in the morning, at noon, and at bedtime.     Cholecalciferol (VITAMIN D3) 50 MCG (2000 UT) TABS Take 2,000 Units by mouth daily with lunch.     denosumab (PROLIA) 60 MG/ML SOLN injection Inject 60 mg into the skin every 6 (six) months. Administer in upper arm, thigh, or abdomen     diltiazem (CARDIZEM CD) 120 MG 24 hr capsule Take 1 capsule (120 mg total) by mouth in the morning and at bedtime.  180 capsule 3   diltiazem (CARDIZEM) 30 MG tablet TAKE 1 TABLET BY MOUTH EVERY 4 HOURS AS NEEDED FOR  HEART  RATE  GREATER  THAN  100  AS  LONG  AS  BLOOD  PRESSURE  GREATER  THAN  100 45 tablet 0   dorzolamide (TRUSOPT) 2 % ophthalmic solution Place 1 drop into the right eye 3 (three) times daily.     furosemide (LASIX) 20 MG tablet Take 60 mg by mouth daily.     ketotifen (ZADITOR) 0.025 % ophthalmic solution Place 1 drop into the left eye 2 (two) times daily.     Latanoprostene Bunod (VYZULTA) 0.024 % SOLN Place 1 drop into the right eye at bedtime.      lidocaine (LMX) 4 % cream Apply 1  application topically daily.     nitroGLYCERIN (NITROSTAT) 0.4 MG SL tablet Place 0.4 mg under the tongue every 5 (five) minutes as needed. For chest pain.     olmesartan (BENICAR) 20 MG tablet Take 10 mg by mouth daily as needed (sbp over 140).     warfarin (COUMADIN) 5 MG tablet Take 2.5-5 mg by mouth See admin instructions. Take 1 tablet (5 mg) by mouth on Mondays, Wednesdays, & Fridays. Take 0.5 tablet (2.5 mg) by mouth on Sundays, Tuesdays, Thursdays, & Saturdays.     No current facility-administered medications for this encounter.    Allergies  Allergen Reactions   Azor [Amlodipine-Olmesartan] Other (See Comments)    Higher doses cause ankles to swell   Beta Adrenergic Blockers Other (See Comments)    Bradycardia.   Dabigatran Etexilate Mesylate Other (See Comments)   Norvasc [Amlodipine Besylate] Other (See Comments)    edema   Parathyroid Hormone (Recomb) Nausea And Vomiting    Other reaction(s): nausea   Pradaxa [Dabigatran Etexilate Mesylate] Other (See Comments)    GI Bleed   Questran [Cholestyramine] Other (See Comments)    Constipation   Sulfa Drugs Cross Reactors Hives   Sulfacetamide Sod-Sulfur Wash [Sulfacet-Sulfur Wash &Cleanser] Other (See Comments)    Social History   Socioeconomic History   Marital status: Married    Spouse name: Not on file   Number of children: Not on  file   Years of education: Not on file   Highest education level: Not on file  Occupational History   Not on file  Tobacco Use   Smoking status: Never   Smokeless tobacco: Never  Vaping Use   Vaping Use: Never used  Substance and Sexual Activity   Alcohol use: No   Drug use: No   Sexual activity: Not Currently    Birth control/protection: Post-menopausal  Other Topics Concern   Not on file  Social History Narrative   Pt lives in [place].  Retired form [employer].   Attends [employer]   Social Determinants of Health   Financial Resource Strain: Not on file  Food Insecurity: Not on file  Transportation Needs: Not on file  Physical Activity: Not on file  Stress: Not on file  Social Connections: Not on file  Intimate Partner Violence: Not on file    Family History  Problem Relation Age of Onset   Heart disease Mother    Heart disease Father    Heart disease Brother    Cancer Maternal Grandmother    Heart disease Maternal Grandfather    Pneumonia Paternal Grandmother    Pneumonia Paternal Grandfather    Heart disease Brother    Heart disease Brother    Pneumonia Sister     ROS- All systems are reviewed and negative except as per the HPI above  Physical Exam: Vitals:   05/02/21 1516  BP: (!) 180/68  Pulse: (!) 119  Weight: 48.9 kg  Height: 5\' 4"  (1.626 m)   Wt Readings from Last 3 Encounters:  05/02/21 48.9 kg  03/21/21 50.3 kg  03/14/21 50.3 kg    Labs: Lab Results  Component Value Date   NA 138 03/08/2021   K 4.3 03/08/2021   CL 104 03/08/2021   CO2 28 03/08/2021   GLUCOSE 116 (H) 03/08/2021   BUN 20 03/08/2021   CREATININE 0.68 03/08/2021   CALCIUM 10.4 (H) 03/08/2021   MG 2.2 09/13/2010   Lab Results  Component Value Date   INR 2.9 (H) 03/08/2021  Lab Results  Component Value Date   CHOL  04/30/2010    149        ATP III CLASSIFICATION:  <200     mg/dL   Desirable  272-536  mg/dL   Borderline High  >=644    mg/dL   High           HDL 71 04/30/2010   LDLCALC  04/30/2010    70        Total Cholesterol/HDL:CHD Risk Coronary Heart Disease Risk Table                     Men   Women  1/2 Average Risk   3.4   3.3  Average Risk       5.0   4.4  2 X Average Risk   9.6   7.1  3 X Average Risk  23.4   11.0        Use the calculated Patient Ratio above and the CHD Risk Table to determine the patient's CHD Risk.        ATP III CLASSIFICATION (LDL):  <100     mg/dL   Optimal  034-742  mg/dL   Near or Above                    Optimal  130-159  mg/dL   Borderline  595-638  mg/dL   High  >756     mg/dL   Very High   TRIG 42 43/32/9518     GEN- The patient is well appearing, alert and oriented x 3 today.   Head- normocephalic, atraumatic Eyes-  Sclera clear, conjunctiva pink Ears- hearing intact Oropharynx- clear Neck- supple, no JVP Lymph- no cervical lymphadenopathy Lungs- Clear to ausculation bilaterally, normal work of breathing Heart-rapid irregular rate and rhythm, no murmurs, rubs or gallops, PMI not laterally displaced GI- soft, NT, ND, + BS Extremities- no clubbing, cyanosis, or +  edema bilateral feet and ankles MS- no significant deformity or atrophy Skin- no rash or lesion Psych- euthymic mood, full affect Neuro- strength and sensation are intact  EKG-  probable atrial flutter at 120 bpm  TEE echo- 1. Very limited images obtained due to patient's respiratory difficulty.  Focused exam performed.   2. Left ventricular ejection fraction, by estimation, is 30 to 35%. The  left ventricle has moderately decreased function.   3. Left atrial size was mild to moderately dilated. No left atrial/left  atrial appendage thrombus was detected.   4. A small pericardial effusion is present.   5. The mitral valve is grossly normal. Trivial mitral valve  regurgitation.   6. Aortic valve regurgitation is not visualized.   Conclusion(s)/Recommendation(s): No LA/LAA thrombus identified. Successful   cardioversion performed with restoration of normal sinus rhythm.   Assessment and Plan: 1. Atrial fib/flutter with RVR Successful CV in  September and is out of rhythm today Discussed antiarrhythmic's I think best AAD for her would be amiodarone to start at 200 mg bid  with food  Continue  120 mg bid  of cardizem  She uses 30 mg Cardizem as needed  to control v rates   Cmet/tsh today for baseline  2. CHA2DS2VASc score  of at least 4 Continue  Warfarin  INR today Is scheduled at PCP office tomorrow  Will forward results  Will need weekly INR's for possible cardioversion Coumadin dose may have to be reduced with amiodarone on board   3. HTN Elevated today  I will bring back to clinic in  one week, for EKG on amiodarone, INR and baseline CXR      Butch Penny C. Allegra Cerniglia, [facility] [address] [place], [place] [address] [phone]

## 2021-05-02 NOTE — Telephone Encounter (Signed)
Received a page from pharmacist from [facility] regarding labs and warfarin.   I have recommended to continue current dose of warfarin tonight. She had pending lab for INR and plan to forward this to PCP. Continue Amiodarone and Cardizem as prescribed.

## 2021-05-03 ENCOUNTER — Other Ambulatory Visit (HOSPITAL_COMMUNITY): Payer: Self-pay | Admitting: Nurse Practitioner

## 2021-05-09 ENCOUNTER — Other Ambulatory Visit: Payer: Self-pay

## 2021-05-09 ENCOUNTER — Ambulatory Visit (HOSPITAL_COMMUNITY)
Admission: RE | Admit: 2021-05-09 | Discharge: 2021-05-09 | Disposition: A | Discharge status: Home / Self Care | Payer: Medicare Other | Source: Ambulatory Visit | Attending: Nurse Practitioner | Admitting: Nurse Practitioner

## 2021-05-09 DIAGNOSIS — Z79899 Other long term (current) drug therapy: Secondary | ICD-10-CM | POA: Diagnosis not present

## 2021-05-09 DIAGNOSIS — I4891 Unspecified atrial fibrillation: Secondary | ICD-10-CM | POA: Diagnosis not present

## 2021-05-09 LAB — PROTIME-INR
INR: 3.5 — ABNORMAL HIGH (ref 0.8–1.2)
Prothrombin Time: 35.1 seconds — ABNORMAL HIGH (ref 11.4–15.2)

## 2021-05-09 NOTE — Progress Notes (Signed)
In for EKG with start of amiodarone 200 mg bid for treatment  of atrial flutter, now with return of SR. Pt feels improved. INR today and baseline CXR for start of amiodarone. I will see back in one week. EKG shows NSR with LBBB, pr int 190 ms, qrs int 140 ms, qtc 467 ms.

## 2021-05-16 ENCOUNTER — Other Ambulatory Visit: Payer: Self-pay

## 2021-05-16 ENCOUNTER — Encounter (HOSPITAL_COMMUNITY): Payer: Medicare Other | Admitting: Nurse Practitioner

## 2021-05-16 ENCOUNTER — Ambulatory Visit (HOSPITAL_COMMUNITY)
Admission: RE | Admit: 2021-05-16 | Discharge: 2021-05-16 | Disposition: A | Discharge status: Home / Self Care | Payer: Medicare Other | Source: Ambulatory Visit | Attending: Nurse Practitioner | Admitting: Nurse Practitioner

## 2021-05-16 VITALS — HR 64

## 2021-05-16 DIAGNOSIS — I447 Left bundle-branch block, unspecified: Secondary | ICD-10-CM | POA: Insufficient documentation

## 2021-05-16 DIAGNOSIS — R600 Localized edema: Secondary | ICD-10-CM | POA: Insufficient documentation

## 2021-05-16 DIAGNOSIS — Z79899 Other long term (current) drug therapy: Secondary | ICD-10-CM | POA: Insufficient documentation

## 2021-05-16 DIAGNOSIS — I4819 Other persistent atrial fibrillation: Secondary | ICD-10-CM

## 2021-05-16 MED ORDER — AMIODARONE HCL 200 MG PO TABS: 200.0000 mg | ORAL_TABLET | Freq: Every day | ORAL | 3 refills | Status: DC

## 2021-05-16 NOTE — Patient Instructions (Addendum)
Increase lasix to 80mg  a day for 3 days then back to normal dosing of 60mg  a day.    Decrease Amiodarone 200mg  once a day

## 2021-05-16 NOTE — Progress Notes (Signed)
Patient returns for follow up ECG on amiodarone. She remains in SR HR 64, LBBB, PR 182, QRS 136, QTc 453. Will decrease amiodarone to 200 mg daily. She does have some foot and leg edema. Will increase her Lasix to 80 mg x 3 days then decrease back to 60 mg daily. F/u in the AF clinic in 3 months.

## 2021-05-17 DIAGNOSIS — Z7901 Long term (current) use of anticoagulants: Secondary | ICD-10-CM | POA: Diagnosis not present

## 2021-05-17 DIAGNOSIS — I4891 Unspecified atrial fibrillation: Secondary | ICD-10-CM | POA: Diagnosis not present

## 2021-05-17 DIAGNOSIS — H401133 Primary open-angle glaucoma, bilateral, severe stage: Secondary | ICD-10-CM | POA: Diagnosis not present

## 2021-06-15 DIAGNOSIS — I10 Essential (primary) hypertension: Secondary | ICD-10-CM | POA: Diagnosis not present

## 2021-06-20 DIAGNOSIS — I2510 Atherosclerotic heart disease of native coronary artery without angina pectoris: Secondary | ICD-10-CM | POA: Diagnosis not present

## 2021-06-20 DIAGNOSIS — Z0000 Encounter for general adult medical examination without abnormal findings: Secondary | ICD-10-CM | POA: Diagnosis not present

## 2021-06-20 DIAGNOSIS — K219 Gastro-esophageal reflux disease without esophagitis: Secondary | ICD-10-CM | POA: Diagnosis not present

## 2021-06-20 DIAGNOSIS — I779 Disorder of arteries and arterioles, unspecified: Secondary | ICD-10-CM | POA: Diagnosis not present

## 2021-06-21 ENCOUNTER — Ambulatory Visit (INDEPENDENT_AMBULATORY_CARE_PROVIDER_SITE_OTHER): Payer: Medicare Other

## 2021-06-21 DIAGNOSIS — I441 Atrioventricular block, second degree: Secondary | ICD-10-CM

## 2021-06-21 LAB — CUP PACEART REMOTE DEVICE CHECK
Battery Impedance: 1809 Ohm
Battery Remaining Longevity: 37 mo
Battery Voltage: 2.74 V
Brady Statistic AP VP Percent: 31 %
Brady Statistic AP VS Percent: 54 %
Brady Statistic AS VP Percent: 1 %
Brady Statistic AS VS Percent: 14 %
Date Time Interrogation Session: 20221222102511
Implantable Lead Implant Date: 20130515
Implantable Lead Implant Date: 20130515
Implantable Lead Location: 753859
Implantable Lead Location: 753860
Implantable Lead Model: 5076
Implantable Lead Model: 5076
Implantable Pulse Generator Implant Date: 20130515
Lead Channel Impedance Value: 403 Ohm
Lead Channel Impedance Value: 714 Ohm
Lead Channel Pacing Threshold Amplitude: 0.5 V
Lead Channel Pacing Threshold Amplitude: 1 V
Lead Channel Pacing Threshold Pulse Width: 0.4 ms
Lead Channel Pacing Threshold Pulse Width: 0.4 ms
Lead Channel Setting Pacing Amplitude: 2 V
Lead Channel Setting Pacing Amplitude: 2.5 V
Lead Channel Setting Pacing Pulse Width: 0.4 ms
Lead Channel Setting Sensing Sensitivity: 5.6 mV

## 2021-06-29 DIAGNOSIS — M810 Age-related osteoporosis without current pathological fracture: Secondary | ICD-10-CM | POA: Diagnosis not present

## 2021-07-03 DIAGNOSIS — D0472 Carcinoma in situ of skin of left lower limb, including hip: Secondary | ICD-10-CM | POA: Diagnosis not present

## 2021-07-03 DIAGNOSIS — I872 Venous insufficiency (chronic) (peripheral): Secondary | ICD-10-CM | POA: Diagnosis not present

## 2021-07-03 DIAGNOSIS — C44719 Basal cell carcinoma of skin of left lower limb, including hip: Secondary | ICD-10-CM | POA: Diagnosis not present

## 2021-07-03 NOTE — Progress Notes (Signed)
Remote pacemaker transmission.   

## 2021-07-18 DIAGNOSIS — I872 Venous insufficiency (chronic) (peripheral): Secondary | ICD-10-CM | POA: Diagnosis not present

## 2021-08-01 DIAGNOSIS — Z4801 Encounter for change or removal of surgical wound dressing: Secondary | ICD-10-CM | POA: Diagnosis not present

## 2021-08-13 DIAGNOSIS — Z08 Encounter for follow-up examination after completed treatment for malignant neoplasm: Secondary | ICD-10-CM | POA: Diagnosis not present

## 2021-08-13 DIAGNOSIS — C4491 Basal cell carcinoma of skin, unspecified: Secondary | ICD-10-CM | POA: Diagnosis not present

## 2021-08-13 DIAGNOSIS — L988 Other specified disorders of the skin and subcutaneous tissue: Secondary | ICD-10-CM | POA: Diagnosis not present

## 2021-08-13 DIAGNOSIS — Z85828 Personal history of other malignant neoplasm of skin: Secondary | ICD-10-CM | POA: Diagnosis not present

## 2021-08-15 ENCOUNTER — Ambulatory Visit (HOSPITAL_COMMUNITY)
Admission: RE | Admit: 2021-08-15 | Discharge: 2021-08-15 | Disposition: A | Discharge status: Home / Self Care | Payer: Medicare Other | Source: Ambulatory Visit | Attending: Nurse Practitioner | Admitting: Nurse Practitioner

## 2021-08-15 ENCOUNTER — Other Ambulatory Visit: Payer: Self-pay

## 2021-08-15 ENCOUNTER — Encounter (HOSPITAL_COMMUNITY): Payer: Self-pay | Admitting: Nurse Practitioner

## 2021-08-15 VITALS — BP 178/66 | HR 61 | Ht 64.0 in | Wt 108.2 lb

## 2021-08-15 DIAGNOSIS — I10 Essential (primary) hypertension: Secondary | ICD-10-CM | POA: Diagnosis not present

## 2021-08-15 DIAGNOSIS — I480 Paroxysmal atrial fibrillation: Secondary | ICD-10-CM | POA: Diagnosis not present

## 2021-08-15 DIAGNOSIS — I4892 Unspecified atrial flutter: Secondary | ICD-10-CM | POA: Diagnosis not present

## 2021-08-15 DIAGNOSIS — I441 Atrioventricular block, second degree: Secondary | ICD-10-CM | POA: Insufficient documentation

## 2021-08-15 DIAGNOSIS — Z950 Presence of cardiac pacemaker: Secondary | ICD-10-CM | POA: Diagnosis not present

## 2021-08-15 DIAGNOSIS — Z7901 Long term (current) use of anticoagulants: Secondary | ICD-10-CM | POA: Diagnosis not present

## 2021-08-15 DIAGNOSIS — I4819 Other persistent atrial fibrillation: Secondary | ICD-10-CM

## 2021-08-15 DIAGNOSIS — I482 Chronic atrial fibrillation, unspecified: Secondary | ICD-10-CM | POA: Insufficient documentation

## 2021-08-15 DIAGNOSIS — I2510 Atherosclerotic heart disease of native coronary artery without angina pectoris: Secondary | ICD-10-CM | POA: Insufficient documentation

## 2021-08-15 DIAGNOSIS — D6869 Other thrombophilia: Secondary | ICD-10-CM | POA: Diagnosis not present

## 2021-08-15 DIAGNOSIS — R9431 Abnormal electrocardiogram [ECG] [EKG]: Secondary | ICD-10-CM | POA: Insufficient documentation

## 2021-08-15 DIAGNOSIS — I4589 Other specified conduction disorders: Secondary | ICD-10-CM | POA: Diagnosis not present

## 2021-08-15 DIAGNOSIS — Z79899 Other long term (current) drug therapy: Secondary | ICD-10-CM | POA: Diagnosis not present

## 2021-08-15 NOTE — Progress Notes (Signed)
Primary Care Physician: [name], MD Referring Physician: Device clinic Cardiologist: Dr. Vira Agar is a 88 y.o. female with a h/o paroxysmal afib/flutter, PPM with second degree Mobitz II AV block, CAD, HTN that is in the afib clinic  for pt reporting elevated heart rate since Tuesday pm.. She has taken 2  extra 30 mg diltiazem to no avail controlling her v rates. She usually runs very fast with her atrial flutter.  Ekg shows atrial flutter at 135 bpm. BP is stable. No known trigger. She continues on warfarin with a CHA2DS2VASc score of at least 5.   F/u in afib clinic, 03/08/21. She  is here as she went into afib last week. She feels fatigued in afib. Her heart rates have been controlled . 2x she has taken a 30 mg Cardizem for a HR over 100 bpm.  She is on warfarin and does not want to wait the 4 weeks for the therapeutic warfarin levels. She has had a TEE/CV in the past.Last INR at PCP 8/30 was 2.7.   F/u in afib clinic, 03/21/21. She had a successful TEE guided cardioversion. She had some respiratory difficulty with TEE  limited  images were performed. She remains in SR today.Although her weight is stable, she has noted swollen feet for the last few days. She is taking 60 mg lasix but at 4 pm daily and is c/o of getting up often at night. Will increase x 3 days and move to am. Her BP is also elevated.  F/u afib clinic, 05/02/22.  She called this am as she has been out of rhythm since yesterday with RVR despite takeing cardizxem 30 mg tabs. .EKG reads sinus tach at 120 bpm, but I suspect atrial flutter or atrial tach.   She just had a cardioversion in September so antiarrythmic's are discussed today. I discussed use of amiodarone with her to help maintain SR. Risk vrs benefit of drug discussed and pt would like to proceed. She  has a TEE in September, negative for thrombus  and last INR 10/7 was 2.7. She has coumadin followed at [facility]   F/u in afib clinic, 08/15/21.  She is here for amiodarone surveillance. EKG shows AV paced rhythm. She does not report any issues. She is doing well staying in SR.   Today, she denies symptoms of palpitations, chest pain, shortness of breath, orthopnea, PND, lower extremity edema, dizziness, presyncope, syncope, or neurologic sequela. The patient is tolerating medications without difficulties and is otherwise without complaint today.   Past Medical History:  Diagnosis Date   Anemia    Bilateral carotid bruits    Coronary disease    s/p PTCA 1990s   GERD (gastroesophageal reflux disease)    takes Protonix daily   GI bleed    previously with pradaxa   Glaucoma    uses Eye Drops daily   H/O hiatal hernia    Hyperlipidemia    takes Atorvastatin daily   Hypertension    takes Azor and Apresoline daily   LBBB (left bundle branch block)    Pacemaker    MDT Advisa May 2013, Dr. Salena Saner   PAF (paroxysmal atrial fibrillation) (HCC)    PVI ablation May 2013, takes Coumadin daily   Sore throat    since anesthesia in Feb 2015 AND ALLEGERIES   Past Surgical History:  Procedure Laterality Date   atrial fibrillation ablation  11/12/11   PVI by Dr Johney Frame   ATRIAL FIBRILLATION ABLATION  N/A 11/12/2011   Procedure: ATRIAL FIBRILLATION ABLATION;  Surgeon: [name], MD;  Location: [facility] CATH LAB;  Service: Cardiovascular;  Laterality: N/A;   CARDIOVERSION N/A 05/26/2019   Procedure: CARDIOVERSION;  Surgeon: Chilton Si, MD;  Location: [facility] ENDOSCOPY;  Service: Cardiovascular;  Laterality: N/A;   CARDIOVERSION N/A 03/14/2021   Procedure: CARDIOVERSION;  Surgeon: Parke Poisson, MD;  Location: [facility] ENDOSCOPY;  Service: Cardiovascular;  Laterality: N/A;   CATARACT EXTRACTION W/ INTRAOCULAR LENS  IMPLANT, BILATERAL     COLONOSCOPY     CORONARY ANGIOPLASTY  1990s   by Dr Aleen Campi   ESOPHAGOGASTRECTOMY     EYE SURGERY     GLAUCOMA SURG LT EYE   KYPHOPLASTY N/A 08/09/2013   Procedure: LUMBAR TWO KYPHOPLASTY;  Surgeon: Barnett Abu,  MD;  Location: [facility] NEURO ORS;  Service: Neurosurgery;  Laterality: N/A;  L2 Kyphoplasty   KYPHOPLASTY N/A 09/07/2013   Procedure: L1 KYPHOPLASTY;  Surgeon: Barnett Abu, MD;  Location: [facility] NEURO ORS;  Service: Neurosurgery;  Laterality: N/A;   KYPHOPLASTY N/A 10/04/2013   Procedure: T12 KYPHOPLASTY;  Surgeon: Barnett Abu, MD;  Location: [facility] NEURO ORS;  Service: Neurosurgery;  Laterality: N/A;  T12 KYPHOPLASTY   PACEMAKER INSERTION  11/13/11   MDT implanted by Dr Royann Shivers for 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 observed on EP study   PERMANENT PACEMAKER INSERTION N/A 11/13/2011   Procedure: PERMANENT PACEMAKER INSERTION;  Surgeon: Thurmon Fair, MD;  Location: [facility] CATH LAB;  Service: Cardiovascular;  Laterality: N/A;   TEE WITHOUT CARDIOVERSION  11/11/2011   Procedure: TRANSESOPHAGEAL ECHOCARDIOGRAM (TEE);  Surgeon: Chrystie Nose, MD;  Location: [facility] ENDOSCOPY;  Service: Cardiovascular;  Laterality: N/A;   TEE WITHOUT CARDIOVERSION N/A 05/26/2019   Procedure: TRANSESOPHAGEAL ECHOCARDIOGRAM (TEE);  Surgeon: Chilton Si, MD;  Location: [facility] ENDOSCOPY;  Service: Cardiovascular;  Laterality: N/A;   TEE WITHOUT CARDIOVERSION N/A 03/14/2021   Procedure: TRANSESOPHAGEAL ECHOCARDIOGRAM (TEE);  Surgeon: Parke Poisson, MD;  Location: [facility] ENDOSCOPY;  Service: Cardiovascular;  Laterality: N/A;   TEMPORARY PACEMAKER INSERTION  11/12/2011   Procedure: TEMPORARY PACEMAKER INSERTION;  Surgeon: [name], MD;  Location: [facility] CATH LAB;  Service: Cardiovascular;;    Current Outpatient Medications  Medication Sig Dispense Refill   amiodarone (PACERONE) 200 MG tablet Take 1 tablet (200 mg total) by mouth daily. 30 tablet 3   atorvastatin (LIPITOR) 40 MG tablet Take 40 mg by mouth every evening.   2   brimonidine (ALPHAGAN P) 0.1 % SOLN Place 1 drop into the right eye in the morning, at noon, and at bedtime.     Cholecalciferol (VITAMIN D3) 50 MCG (2000 UT) TABS Take 2,000 Units by mouth daily with lunch.      clobetasol cream (TEMOVATE) 0.05 % Apply 1 application topically as needed.     denosumab (PROLIA) 60 MG/ML SOLN injection Inject 60 mg into the skin every 6 (six) months. Administer in upper arm, thigh, or abdomen     diltiazem (CARDIZEM CD) 120 MG 24 hr capsule Take 1 capsule (120 mg total) by mouth in the morning and at bedtime. 180 capsule 3   diltiazem (CARDIZEM) 30 MG tablet TAKE 1 TABLET BY MOUTH EVERY 4 HOURS AS NEEDED FOR  HEART  RATE  GREATER  THAN  100  ,  AS  LONG  AS  BLOOD  PRESSURE  GREATER  THAN  100 45 tablet 0   dorzolamide (TRUSOPT) 2 % ophthalmic solution Place 1 drop into the right eye 3 (three) times daily.  ELIQUIS 2.5 MG TABS tablet Take 2.5 mg by mouth 2 (two) times daily.     furosemide (LASIX) 20 MG tablet Take 60 mg by mouth daily.     ketotifen (ZADITOR) 0.025 % ophthalmic solution Place 1 drop into the left eye 2 (two) times daily.     Latanoprostene Bunod (VYZULTA) 0.024 % SOLN Place 1 drop into the right eye at bedtime.      lidocaine (LMX) 4 % cream Apply 1 application topically daily.     nitroGLYCERIN (NITROSTAT) 0.4 MG SL tablet Place 0.4 mg under the tongue every 5 (five) minutes as needed. For chest pain.     olmesartan (BENICAR) 20 MG tablet Take 10 mg by mouth daily as needed (sbp over 140).     No current facility-administered medications for this encounter.    Allergies  Allergen Reactions   Azor [Amlodipine-Olmesartan] Other (See Comments)    Higher doses cause ankles to swell   Beta Adrenergic Blockers Other (See Comments)    Bradycardia.   Dabigatran Etexilate Mesylate Other (See Comments)   Norvasc [Amlodipine Besylate] Other (See Comments)    edema   Norvasc [Amlodipine] Other (See Comments)    Edema, lowered heart rate   Parathyroid Hormone (Recomb) Nausea And Vomiting    Other reaction(s): nausea   Pradaxa [Dabigatran Etexilate Mesylate] Other (See Comments)    GI Bleed   Questran [Cholestyramine] Other (See Comments)    Constipation    Sulfa Drugs Cross Reactors Hives   Sulfacetamide Sod-Sulfur Wash [Sulfacet-Sulfur Wash &Cleanser] Other (See Comments)    Social History   Socioeconomic History   Marital status: Married    Spouse name: Not on file   Number of children: Not on file   Years of education: Not on file   Highest education level: Not on file  Occupational History   Not on file  Tobacco Use   Smoking status: Never   Smokeless tobacco: Never  Vaping Use   Vaping Use: Never used  Substance and Sexual Activity   Alcohol use: No   Drug use: No   Sexual activity: Not Currently    Birth control/protection: Post-menopausal  Other Topics Concern   Not on file  Social History Narrative   Pt lives in [place].  Retired form [employer].   Attends [employer]   Social Determinants of Health   Financial Resource Strain: Not on file  Food Insecurity: Not on file  Transportation Needs: Not on file  Physical Activity: Not on file  Stress: Not on file  Social Connections: Not on file  Intimate Partner Violence: Not on file    Family History  Problem Relation Age of Onset   Heart disease Mother    Heart disease Father    Heart disease Brother    Cancer Maternal Grandmother    Heart disease Maternal Grandfather    Pneumonia Paternal Grandmother    Pneumonia Paternal Grandfather    Heart disease Brother    Heart disease Brother    Pneumonia Sister     ROS- All systems are reviewed and negative except as per the HPI above  Physical Exam: Vitals:   08/15/21 1352  BP: (!) 178/66  Pulse: 61  Weight: 49.1 kg  Height: 5\' 4"  (1.626 m)   Wt Readings from Last 3 Encounters:  08/15/21 49.1 kg  05/02/21 48.9 kg  03/21/21 50.3 kg    Labs: Lab Results  Component Value Date   NA 137 05/02/2021   K 3.8  05/02/2021   CL 104 05/02/2021   CO2 26 05/02/2021   GLUCOSE 96 05/02/2021   BUN 18 05/02/2021   CREATININE 0.62 05/02/2021   CALCIUM 10.0 05/02/2021   MG 2.2 09/13/2010   Lab  Results  Component Value Date   INR 3.5 (H) 05/09/2021   Lab Results  Component Value Date   CHOL  04/30/2010    149        ATP III CLASSIFICATION:  <200     mg/dL   Desirable  161-096  mg/dL   Borderline High  >=045    mg/dL   High          HDL 71 04/30/2010   LDLCALC  04/30/2010    70        Total Cholesterol/HDL:CHD Risk Coronary Heart Disease Risk Table                     Men   Women  1/2 Average Risk   3.4   3.3  Average Risk       5.0   4.4  2 X Average Risk   9.6   7.1  3 X Average Risk  23.4   11.0        Use the calculated Patient Ratio above and the CHD Risk Table to determine the patient's CHD Risk.        ATP III CLASSIFICATION (LDL):  <100     mg/dL   Optimal  409-811  mg/dL   Near or Above                    Optimal  130-159  mg/dL   Borderline  914-782  mg/dL   High  >956     mg/dL   Very High   TRIG 42 21/30/8657     GEN- The patient is well appearing, alert and oriented x 3 today.   Head- normocephalic, atraumatic Eyes-  Sclera clear, conjunctiva pink Ears- hearing intact Oropharynx- clear Neck- supple, no JVP Lymph- no cervical lymphadenopathy Lungs- Clear to ausculation bilaterally, normal work of breathing Heart-rapid irregular rate and rhythm, no murmurs, rubs or gallops, PMI not laterally displaced GI- soft, NT, ND, + BS Extremities- no clubbing, cyanosis, or +  edema bilateral feet and ankles MS- no significant deformity or atrophy Skin- no rash or lesion Psych- euthymic mood, full affect Neuro- strength and sensation are intact  EKG- Vent. rate 61 BPM PR interval 248 ms QRS duration 160 ms QT/QTcB 470/473 ms P-R-T axes * -77 95 AV dual-paced rhythm with prolonged AV conduction Abnormal ECG When compared with ECG of 16-May-2021 11:28, PREVIOUS ECG IS PRESENT  TEE echo- 1. Very limited images obtained due to patient's respiratory difficulty.  Focused exam performed.   2. Left ventricular ejection fraction, by estimation, is 30  to 35%. The  left ventricle has moderately decreased function.   3. Left atrial size was mild to moderately dilated. No left atrial/left  atrial appendage thrombus was detected.   4. A small pericardial effusion is present.   5. The mitral valve is grossly normal. Trivial mitral valve  regurgitation.   6. Aortic valve regurgitation is not visualized.   Conclusion(s)/Recommendation(s): No LA/LAA thrombus identified. Successful  cardioversion performed with restoration of normal sinus rhythm.   Assessment and Plan: 1. Atrial fib/flutter with RVR Successful CV in September with ERAF  Started amiodarone  200 mg bid  then to 200 mg daily  Staying in SR with  this  Continue  120 mg bid  of cardizem  Recent labs with PCP stable   2. CHA2DS2VASc score  of at least 4 Switched from coumadin to  eliquis 2.5 mg bid due to fluctuating INR's with amiodarone addition  3. HTN Elevated today, at home stable   BP today at home 136 systolic   F/u here  in 6 months  F/u with Salli Quarry, 09/17/21    Elvina Sidle. [facility] [address] [place], [place] [address] [phone]

## 2021-08-21 ENCOUNTER — Other Ambulatory Visit: Payer: Self-pay

## 2021-08-21 MED ORDER — DILTIAZEM HCL ER COATED BEADS 120 MG PO CP24: 120.0000 mg | ORAL_CAPSULE | Freq: Two times a day (BID) | ORAL | 0 refills | Status: DC

## 2021-08-27 DIAGNOSIS — H61031 Chondritis of right external ear: Secondary | ICD-10-CM | POA: Diagnosis not present

## 2021-08-27 DIAGNOSIS — L821 Other seborrheic keratosis: Secondary | ICD-10-CM | POA: Diagnosis not present

## 2021-08-27 DIAGNOSIS — L820 Inflamed seborrheic keratosis: Secondary | ICD-10-CM | POA: Diagnosis not present

## 2021-08-27 DIAGNOSIS — L814 Other melanin hyperpigmentation: Secondary | ICD-10-CM | POA: Diagnosis not present

## 2021-09-10 DIAGNOSIS — L988 Other specified disorders of the skin and subcutaneous tissue: Secondary | ICD-10-CM | POA: Diagnosis not present

## 2021-09-10 DIAGNOSIS — Z4801 Encounter for change or removal of surgical wound dressing: Secondary | ICD-10-CM | POA: Diagnosis not present

## 2021-09-11 DIAGNOSIS — H401133 Primary open-angle glaucoma, bilateral, severe stage: Secondary | ICD-10-CM | POA: Diagnosis not present

## 2021-09-14 NOTE — Progress Notes (Signed)
? ?Cardiology Office Note ?Date:  09/14/2021  ?Patient ID:  Rhonda [name], Rhonda [name] May 05, 1930, MRN [id number] ?PCP:  Deland Pretty, MD  ?Electrophysiologist: Dr. Rayann Heman ?  ?Chief Complaint:  annual visit  ? ?History of Present Illness: ?Rhonda [name] is a 88 y.o. female with history of PAFib, LBBB, Glaucoma, CAD, and PPM, remote CAD (PCI [phone]) ? ?She comes in today to be seen for Dr. Rayann Heman, last seen by him July 2021, [facility] since [facility]. ? ?She had DCCV 03/14/21 for Afib ? ?She last saw Roderic Palau, NP 08/15/21, she had been out of rhythm back in Nov for some time, AFlutter vs AT and started on amiodarone, at her feb visit she was feeling well.  She was AV paced, planned to keep EP follow up in March ? ?TODAY ?She is accompanied by her husband. ?Generally doesn't feel like she has as much energy as she would like though this seems to date back a few years. ?No CP, palpitations, she does not think she has had any AFib or at least not much. ?No near syncope or syncope. ?She is doing well with Eliquis, though is very expensive ?No bleeding or signs of bleeding ? ? ? ? ?AFib Hx ?Diagnosed 2010 ?PVI ablation 11/12/2011 ?AFlutter 2020 ?Hx of GIB on Pradaxa ? ?AAD hx ?Tikosyn 2012 stopped 2013 with failure to maintain SR ?Amiodarone Nov 2022 is current ? ? Device information ?MDT dual chamber PPM implanted 11/13/2011 ? ? ?Past Medical History:  ?Diagnosis Date  ? Anemia   ? Bilateral carotid bruits   ? Coronary disease   ? s/p PTCA 1990s  ? GERD (gastroesophageal reflux disease)   ? takes Protonix daily  ? GI bleed   ? previously with pradaxa  ? Glaucoma   ? uses Eye Drops daily  ? H/O hiatal hernia   ? Hyperlipidemia   ? takes Atorvastatin daily  ? Hypertension   ? takes Azor and Apresoline daily  ? LBBB (left bundle branch block)   ? Pacemaker   ? MDT Advisa May 2013, Dr. C  ? PAF (paroxysmal atrial fibrillation) ([place])   ? PVI ablation May 2013, takes Coumadin daily  ? Sore throat   ? since  anesthesia in Feb 2015 AND ALLEGERIES  ? ? ?Past Surgical History:  ?Procedure Laterality Date  ? atrial fibrillation ablation  11/12/11  ? PVI by Dr Rayann Heman  ? ATRIAL FIBRILLATION ABLATION N/A 11/12/2011  ? Procedure: ATRIAL FIBRILLATION ABLATION;  Surgeon: Thompson Grayer, MD;  Location: [facility] CATH LAB;  Service: Cardiovascular;  Laterality: N/A;  ? CARDIOVERSION N/A 05/26/2019  ? Procedure: CARDIOVERSION;  Surgeon: Skeet Latch, MD;  Location: [place];  Service: Cardiovascular;  Laterality: N/A;  ? CARDIOVERSION N/A 03/14/2021  ? Procedure: CARDIOVERSION;  Surgeon: Elouise Munroe, MD;  Location: [facility] ENDOSCOPY;  Service: Cardiovascular;  Laterality: N/A;  ? CATARACT EXTRACTION W/ INTRAOCULAR LENS  IMPLANT, BILATERAL    ? COLONOSCOPY    ? CORONARY ANGIOPLASTY  1990s  ? by Dr Glade Lloyd  ? ESOPHAGOGASTRECTOMY    ? EYE SURGERY    ? GLAUCOMA SURG LT EYE  ? KYPHOPLASTY N/A 08/09/2013  ? Procedure: LUMBAR TWO KYPHOPLASTY;  Surgeon: Kristeen Miss, MD;  Location: [place] NEURO ORS;  Service: Neurosurgery;  Laterality: N/A;  L2 Kyphoplasty  ? KYPHOPLASTY N/A 09/07/2013  ? Procedure: L1 KYPHOPLASTY;  Surgeon: Kristeen Miss, MD;  Location: [place] NEURO ORS;  Service: Neurosurgery;  Laterality: N/A;  ? KYPHOPLASTY N/A 10/04/2013  ?  Procedure: T12 KYPHOPLASTY;  Surgeon: Kristeen Miss, MD;  Location: [place] NEURO ORS;  Service: Neurosurgery;  Laterality: N/A;  T12 KYPHOPLASTY  ? PACEMAKER INSERTION  11/13/11  ? MDT implanted by Dr Sallyanne Kuster for [facility] II AV block and infrahisian block observed on EP study  ? PERMANENT PACEMAKER INSERTION N/A 11/13/2011  ? Procedure: PERMANENT PACEMAKER INSERTION;  Surgeon: Sanda Klein, MD;  Location: [place] CATH LAB;  Service: Cardiovascular;  Laterality: N/A;  ? TEE WITHOUT CARDIOVERSION  11/11/2011  ? Procedure: TRANSESOPHAGEAL ECHOCARDIOGRAM (TEE);  Surgeon: Pixie Casino, MD;  Location: [facility] ENDOSCOPY;  Service: Cardiovascular;  Laterality: N/A;  ? TEE WITHOUT CARDIOVERSION N/A 05/26/2019  ? Procedure:  TRANSESOPHAGEAL ECHOCARDIOGRAM (TEE);  Surgeon: Skeet Latch, MD;  Location: [place];  Service: Cardiovascular;  Laterality: N/A;  ? TEE WITHOUT CARDIOVERSION N/A 03/14/2021  ? Procedure: TRANSESOPHAGEAL ECHOCARDIOGRAM (TEE);  Surgeon: Elouise Munroe, MD;  Location: [place];  Service: Cardiovascular;  Laterality: N/A;  ? TEMPORARY PACEMAKER INSERTION  11/12/2011  ? Procedure: TEMPORARY PACEMAKER INSERTION;  Surgeon: Thompson Grayer, MD;  Location: [facility] CATH LAB;  Service: Cardiovascular;;  ? ? ?Current Outpatient Medications  ?Medication Sig Dispense Refill  ? amiodarone (PACERONE) 200 MG tablet Take 1 tablet (200 mg total) by mouth daily. 30 tablet 3  ? atorvastatin (LIPITOR) 40 MG tablet Take 40 mg by mouth every evening.   2  ? brimonidine (ALPHAGAN P) 0.1 % SOLN Place 1 drop into the right eye in the morning, at noon, and at bedtime.    ? Cholecalciferol (VITAMIN D3) 50 MCG (2000 UT) TABS Take 2,000 Units by mouth daily with lunch.    ? clobetasol cream (TEMOVATE) [id number] % Apply 1 application topically as needed.    ? denosumab (PROLIA) 60 MG/ML SOLN injection Inject 60 mg into the skin every 6 (six) months. Administer in upper arm, thigh, or abdomen    ? diltiazem (CARDIZEM CD) 120 MG 24 hr capsule Take 1 capsule (120 mg total) by mouth in the morning and at bedtime. 180 capsule 0  ? diltiazem (CARDIZEM) 30 MG tablet TAKE 1 TABLET BY MOUTH EVERY 4 HOURS AS NEEDED FOR  HEART  RATE  GREATER  THAN  100  ,  AS  LONG  AS  BLOOD  PRESSURE  GREATER  THAN  100 45 tablet 0  ? dorzolamide (TRUSOPT) 2 % ophthalmic solution Place 1 drop into the right eye 3 (three) times daily.    ? ELIQUIS 2.5 MG TABS tablet Take 2.5 mg by mouth 2 (two) times daily.    ? furosemide (LASIX) 20 MG tablet Take 60 mg by mouth daily.    ? ketotifen (ZADITOR) 0.025 % ophthalmic solution Place 1 drop into the left eye 2 (two) times daily.    ? Latanoprostene Bunod (VYZULTA) 0.024 % SOLN Place 1 drop into the right eye at bedtime.     ?  lidocaine (LMX) 4 % cream Apply 1 application topically daily.    ? nitroGLYCERIN (NITROSTAT) 0.4 MG SL tablet Place 0.4 mg under the tongue every 5 (five) minutes as needed. For chest pain.    ? olmesartan (BENICAR) 20 MG tablet Take 10 mg by mouth daily as needed (sbp over 140).    ? ?No current facility-administered medications for this visit.  ? ? ?Allergies:   Azor [amlodipine-olmesartan], Beta adrenergic blockers, Dabigatran etexilate mesylate, Norvasc [amlodipine besylate], Norvasc [amlodipine], Parathyroid hormone (recomb), Pradaxa [dabigatran etexilate mesylate], Questran [cholestyramine], Sulfa drugs cross reactors, and Sulfacetamide sod-sulfur wash [sulfacet-sulfur  wash &cleanser]  ? ?Social History:  The patient  reports that she has never smoked. She has never used smokeless tobacco. She reports that she does not drink alcohol and does not use drugs.  ? ?Family History:  The patient's family history includes Cancer in her maternal grandmother; Heart disease in her brother, brother, brother, father, maternal grandfather, and mother; Pneumonia in her paternal grandfather, paternal grandmother, and sister. ? ?ROS:  Please see the history of present illness.   ?All other systems are reviewed and otherwise negative.  ? ?PHYSICAL EXAM:  ?VS:  There were no vitals taken for this visit. BMI: There is no height or weight on file to calculate BMI. ?Thin, well developed WF, in no acute distress ?HEENT: normocephalic, atraumatic ?Neck: no JVD, carotid bruits or masses ?Cardiac:  RRR; no significant murmurs, no rubs, or gallops ?Lungs:  CTA b/l, no wheezing, rhonchi or rales ?Abd: soft, nontender ?MS: no deformity, advanced atrophy ?Ext: no edema ?Skin: warm and dry, no rash ?Neuro:  No gross deficits appreciated ?Psych: euthymic mood, full affect ? ?PPM site is stable, no tethering or discomfort, no erosion/thinning ? ? ?EKG:  not done today ? ?Pacer interrogation done today and reviewed by myself ?Battery and lead  measurements are ok ?A lead threshold up slightly, PW adjusted to keep 2:1 safety ?AF burden is much improved from Nov ?No HVR episodes ? ?03/14/21: TEE/DCCV ?IMPRESSIONS  ? 1. Very limited images obtained due to pat

## 2021-09-17 ENCOUNTER — Other Ambulatory Visit: Payer: Self-pay

## 2021-09-17 ENCOUNTER — Encounter: Payer: Self-pay | Admitting: Physician Assistant

## 2021-09-17 ENCOUNTER — Ambulatory Visit: Payer: Medicare Other | Admitting: Physician Assistant

## 2021-09-17 VITALS — BP 170/62 | HR 83 | Ht 64.0 in | Wt 107.6 lb

## 2021-09-17 DIAGNOSIS — Z79899 Other long term (current) drug therapy: Secondary | ICD-10-CM | POA: Diagnosis not present

## 2021-09-17 DIAGNOSIS — I4819 Other persistent atrial fibrillation: Secondary | ICD-10-CM

## 2021-09-17 DIAGNOSIS — I429 Cardiomyopathy, unspecified: Secondary | ICD-10-CM | POA: Diagnosis not present

## 2021-09-17 DIAGNOSIS — Z950 Presence of cardiac pacemaker: Secondary | ICD-10-CM

## 2021-09-17 DIAGNOSIS — I10 Essential (primary) hypertension: Secondary | ICD-10-CM | POA: Diagnosis not present

## 2021-09-17 DIAGNOSIS — I2510 Atherosclerotic heart disease of native coronary artery without angina pectoris: Secondary | ICD-10-CM

## 2021-09-17 LAB — CUP PACEART INCLINIC DEVICE CHECK
Battery Impedance: 2021 Ohm
Battery Remaining Longevity: 33 mo
Battery Voltage: 2.74 V
Brady Statistic AP VP Percent: 63 %
Brady Statistic AP VS Percent: 29 %
Brady Statistic AS VP Percent: 2 %
Brady Statistic AS VS Percent: 7 %
Date Time Interrogation Session: 20230320130422
Implantable Lead Implant Date: 20130515
Implantable Lead Implant Date: 20130515
Implantable Lead Location: 753859
Implantable Lead Location: 753860
Implantable Lead Model: 5076
Implantable Lead Model: 5076
Implantable Pulse Generator Implant Date: 20130515
Lead Channel Impedance Value: 420 Ohm
Lead Channel Impedance Value: 779 Ohm
Lead Channel Pacing Threshold Amplitude: 0.5 V
Lead Channel Pacing Threshold Amplitude: 0.75 V
Lead Channel Pacing Threshold Amplitude: 1.125 V
Lead Channel Pacing Threshold Amplitude: 1.25 V
Lead Channel Pacing Threshold Pulse Width: 0.4 ms
Lead Channel Pacing Threshold Pulse Width: 0.4 ms
Lead Channel Pacing Threshold Pulse Width: 0.4 ms
Lead Channel Pacing Threshold Pulse Width: 1 ms
Lead Channel Sensing Intrinsic Amplitude: 4 mV
Lead Channel Setting Pacing Amplitude: 2 V
Lead Channel Setting Pacing Amplitude: 2.5 V
Lead Channel Setting Pacing Pulse Width: 0.4 ms
Lead Channel Setting Sensing Sensitivity: 5.6 mV

## 2021-09-17 LAB — COMPREHENSIVE METABOLIC PANEL
ALT: 22 IU/L (ref 0–32)
AST: 30 IU/L (ref 0–40)
Albumin/Globulin Ratio: 1.6 (ref 1.2–2.2)
Albumin: 4.6 g/dL (ref 3.6–4.6)
Alkaline Phosphatase: 99 IU/L (ref 44–121)
BUN/Creatinine Ratio: 24 (ref 12–28)
BUN: 16 mg/dL (ref 8–27)
Bilirubin Total: 0.7 mg/dL (ref 0.0–1.2)
CO2: 28 mmol/L (ref 20–29)
Calcium: 11 mg/dL — ABNORMAL HIGH (ref 8.7–10.3)
Chloride: 102 mmol/L (ref 96–106)
Creatinine, Ser: 0.68 mg/dL (ref 0.57–1.00)
Globulin, Total: 2.9 g/dL (ref 1.5–4.5)
Glucose: 88 mg/dL (ref 70–99)
Potassium: 4.5 mmol/L (ref 3.5–5.2)
Sodium: 140 mmol/L (ref 134–144)
Total Protein: 7.5 g/dL (ref 6.0–8.5)
eGFR: 83 mL/min/{1.73_m2} (ref >59)

## 2021-09-17 LAB — CBC
Hematocrit: 38.9 % (ref 34.0–46.6)
Hemoglobin: 13.3 g/dL (ref 11.1–15.9)
MCH: 31.4 pg (ref 26.6–33.0)
MCHC: 34.2 g/dL (ref 31.5–35.7)
MCV: 92 fL (ref 79–97)
Platelets: 224 10*3/uL (ref 150–450)
RBC: 4.24 x10E6/uL (ref 3.77–5.28)
RDW: 12.4 % (ref 11.7–15.4)
WBC: 6.1 10*3/uL (ref 3.4–10.8)

## 2021-09-17 LAB — TSH: TSH: 2.55 u[IU]/mL (ref 0.450–4.500)

## 2021-09-17 NOTE — Patient Instructions (Signed)
Medication Instructions:  ? ?Your physician recommends that you continue on your current medications as directed. Please refer to the Current Medication list given to you today. ? ? ?*If you need a refill on your cardiac medications before your next appointment, please call your pharmacy* ? ? ?Lab Work:  CMET  CBC AND TSH TODAY  ? ?If you have labs (blood work) drawn today and your tests are completely normal, you will receive your results only by: ?MyChart Message (if you have MyChart) OR ?A paper copy in the mail ?If you have any lab test that is abnormal or we need to change your treatment, we will call you to review the results. ? ? ?Testing/Procedures: Your physician has requested that you have an echocardiogram. Echocardiography is a painless test that uses sound waves to create images of your heart. It provides your doctor with information about the size and shape of your heart and how well your heart?s chambers and valves are working. This procedure takes approximately one hour. There are no restrictions for this procedure. ? ? ?Follow-Up: ?At [facility], you and your health needs are our priority.  As part of our continuing mission to provide you with exceptional heart care, we have created designated Provider Care Teams.  These Care Teams include your primary Cardiologist (physician) and Advanced Practice Providers (APPs -  Physician Assistants and Nurse Practitioners) who all work together to provide you with the care you need, when you need it. ? ?We recommend signing up for the patient portal called "MyChart".  Sign up information is provided on this After Visit Summary.  MyChart is used to connect with patients for Virtual Visits (Telemedicine).  Patients are able to view lab/test results, encounter notes, upcoming appointments, etc.  Non-urgent messages can be sent to your provider as well.   ?To learn more about what you can do with MyChart, go to [email or web address].   ? ?Your next  appointment:   ?6 month(s) ? ?The format for your next appointment:   ?In Person ? ?Provider:   ?Lewayne Bunting, MD{ ? ? ?Other Instructions ? ?

## 2021-09-18 ENCOUNTER — Telehealth: Payer: Self-pay | Admitting: Physician Assistant

## 2021-09-18 NOTE — Telephone Encounter (Signed)
Renee Norberto Sorenson, PA-C  ?09/17/2021  7:40 PM EDT Back to Top  ?  ?Labs look great, no new recommendations or changes  ? ? ? ?The patient husband (DPR) has been notified of the result and verbalized understanding.  All questions (if any) were answered. ?Sampson Goon, RN 09/18/2021 2:33 PM   ?

## 2021-09-18 NOTE — Telephone Encounter (Signed)
Patient is calling stating she is returning a call she received about 20 mins ago. Thinks it may be due to her lab results from yesterday.  ?

## 2021-09-20 ENCOUNTER — Ambulatory Visit (INDEPENDENT_AMBULATORY_CARE_PROVIDER_SITE_OTHER): Payer: Medicare Other

## 2021-09-20 DIAGNOSIS — I441 Atrioventricular block, second degree: Secondary | ICD-10-CM | POA: Diagnosis not present

## 2021-09-21 LAB — CUP PACEART REMOTE DEVICE CHECK
Battery Impedance: 1966 Ohm
Battery Remaining Longevity: 26 mo
Battery Voltage: 2.73 V
Brady Statistic AP VP Percent: 97 %
Brady Statistic AP VS Percent: 0 %
Brady Statistic AS VP Percent: 3 %
Brady Statistic AS VS Percent: 0 %
Date Time Interrogation Session: 20230323102140
Implantable Lead Implant Date: 20130515
Implantable Lead Implant Date: 20130515
Implantable Lead Location: 753859
Implantable Lead Location: 753860
Implantable Lead Model: 5076
Implantable Lead Model: 5076
Implantable Pulse Generator Implant Date: 20130515
Lead Channel Impedance Value: 420 Ohm
Lead Channel Impedance Value: 786 Ohm
Lead Channel Pacing Threshold Amplitude: 0.5 V
Lead Channel Pacing Threshold Amplitude: 1.125 V
Lead Channel Pacing Threshold Pulse Width: 0.4 ms
Lead Channel Pacing Threshold Pulse Width: 0.4 ms
Lead Channel Setting Pacing Amplitude: 2 V
Lead Channel Setting Pacing Amplitude: 2.5 V
Lead Channel Setting Pacing Pulse Width: 0.4 ms
Lead Channel Setting Sensing Sensitivity: 5.6 mV

## 2021-09-24 ENCOUNTER — Telehealth: Payer: Self-pay | Admitting: *Deleted

## 2021-09-24 ENCOUNTER — Other Ambulatory Visit: Payer: Self-pay

## 2021-09-24 ENCOUNTER — Ambulatory Visit (HOSPITAL_COMMUNITY): Payer: Medicare Other | Attending: Cardiology

## 2021-09-24 DIAGNOSIS — I3481 Nonrheumatic mitral (valve) annulus calcification: Secondary | ICD-10-CM | POA: Diagnosis not present

## 2021-09-24 DIAGNOSIS — I428 Other cardiomyopathies: Secondary | ICD-10-CM

## 2021-09-24 DIAGNOSIS — E785 Hyperlipidemia, unspecified: Secondary | ICD-10-CM | POA: Diagnosis not present

## 2021-09-24 DIAGNOSIS — I429 Cardiomyopathy, unspecified: Secondary | ICD-10-CM | POA: Diagnosis not present

## 2021-09-24 DIAGNOSIS — I10 Essential (primary) hypertension: Secondary | ICD-10-CM | POA: Diagnosis not present

## 2021-09-24 DIAGNOSIS — I447 Left bundle-branch block, unspecified: Secondary | ICD-10-CM | POA: Diagnosis not present

## 2021-09-24 DIAGNOSIS — Z950 Presence of cardiac pacemaker: Secondary | ICD-10-CM | POA: Diagnosis not present

## 2021-09-24 LAB — ECHOCARDIOGRAM COMPLETE
AR max vel: 0.8 cm2
AV Area VTI: 0.88 cm2
AV Area mean vel: 0.8 cm2
AV Mean grad: 19.5 mmHg
AV Peak grad: 34.1 mmHg
Ao pk vel: 2.92 m/s
Area-P 1/2: 4.85 cm2
P 1/2 time: 252 msec
S' Lateral: 1.5 cm

## 2021-09-24 NOTE — Telephone Encounter (Signed)
-----   Message from Sheilah Pigeon, [place] sent at 09/17/2021  7:40 PM EDT ----- ?Labs look great, no new recommendations or changes ?

## 2021-09-24 NOTE — Telephone Encounter (Signed)
Spoke with patient and husband and aware of results and verbalized understanding. ?

## 2021-09-28 NOTE — Progress Notes (Signed)
Remote pacemaker transmission.   

## 2021-10-01 ENCOUNTER — Other Ambulatory Visit (HOSPITAL_COMMUNITY): Payer: Self-pay | Admitting: Physician Assistant

## 2021-10-15 DIAGNOSIS — L905 Scar conditions and fibrosis of skin: Secondary | ICD-10-CM | POA: Diagnosis not present

## 2021-11-19 DIAGNOSIS — L928 Other granulomatous disorders of the skin and subcutaneous tissue: Secondary | ICD-10-CM | POA: Diagnosis not present

## 2021-11-19 DIAGNOSIS — Z4801 Encounter for change or removal of surgical wound dressing: Secondary | ICD-10-CM | POA: Diagnosis not present

## 2021-11-22 ENCOUNTER — Other Ambulatory Visit: Payer: Self-pay | Admitting: Physician Assistant

## 2021-12-20 ENCOUNTER — Ambulatory Visit (INDEPENDENT_AMBULATORY_CARE_PROVIDER_SITE_OTHER): Payer: Medicare Other

## 2021-12-20 DIAGNOSIS — I441 Atrioventricular block, second degree: Secondary | ICD-10-CM | POA: Diagnosis not present

## 2021-12-23 LAB — CUP PACEART REMOTE DEVICE CHECK
Battery Impedance: 2141 Ohm
Battery Remaining Longevity: 23 mo
Battery Voltage: 2.73 V
Brady Statistic AP VP Percent: 99 %
Brady Statistic AP VS Percent: 0 %
Brady Statistic AS VP Percent: 1 %
Brady Statistic AS VS Percent: 0 %
Date Time Interrogation Session: 20230622103114
Implantable Lead Implant Date: 20130515
Implantable Lead Implant Date: 20130515
Implantable Lead Location: 753859
Implantable Lead Location: 753860
Implantable Lead Model: 5076
Implantable Lead Model: 5076
Implantable Pulse Generator Implant Date: 20130515
Lead Channel Impedance Value: 417 Ohm
Lead Channel Impedance Value: 804 Ohm
Lead Channel Pacing Threshold Amplitude: 0.5 V
Lead Channel Pacing Threshold Amplitude: 1.25 V
Lead Channel Pacing Threshold Pulse Width: 0.4 ms
Lead Channel Pacing Threshold Pulse Width: 0.4 ms
Lead Channel Setting Pacing Amplitude: 2 V
Lead Channel Setting Pacing Amplitude: 2.5 V
Lead Channel Setting Pacing Pulse Width: 0.4 ms
Lead Channel Setting Sensing Sensitivity: 5.6 mV

## 2021-12-24 DIAGNOSIS — D485 Neoplasm of uncertain behavior of skin: Secondary | ICD-10-CM | POA: Diagnosis not present

## 2021-12-24 DIAGNOSIS — L905 Scar conditions and fibrosis of skin: Secondary | ICD-10-CM | POA: Diagnosis not present

## 2021-12-27 NOTE — Progress Notes (Signed)
Remote pacemaker transmission.   

## 2022-01-02 DIAGNOSIS — M810 Age-related osteoporosis without current pathological fracture: Secondary | ICD-10-CM | POA: Diagnosis not present

## 2022-02-12 DIAGNOSIS — H401133 Primary open-angle glaucoma, bilateral, severe stage: Secondary | ICD-10-CM | POA: Diagnosis not present

## 2022-02-25 DIAGNOSIS — L814 Other melanin hyperpigmentation: Secondary | ICD-10-CM | POA: Diagnosis not present

## 2022-02-25 DIAGNOSIS — D225 Melanocytic nevi of trunk: Secondary | ICD-10-CM | POA: Diagnosis not present

## 2022-02-25 DIAGNOSIS — H61031 Chondritis of right external ear: Secondary | ICD-10-CM | POA: Diagnosis not present

## 2022-02-25 DIAGNOSIS — L821 Other seborrheic keratosis: Secondary | ICD-10-CM | POA: Diagnosis not present

## 2022-03-20 ENCOUNTER — Encounter: Payer: Self-pay | Admitting: Internal Medicine

## 2022-03-20 ENCOUNTER — Telehealth: Payer: Self-pay | Admitting: Internal Medicine

## 2022-03-20 ENCOUNTER — Ambulatory Visit: Payer: Medicare Other | Attending: Internal Medicine | Admitting: Internal Medicine

## 2022-03-20 ENCOUNTER — Encounter: Payer: Medicare Other | Admitting: Internal Medicine

## 2022-03-20 VITALS — BP 162/74 | HR 76 | Ht 64.0 in | Wt 106.4 lb

## 2022-03-20 DIAGNOSIS — I10 Essential (primary) hypertension: Secondary | ICD-10-CM

## 2022-03-20 DIAGNOSIS — I4819 Other persistent atrial fibrillation: Secondary | ICD-10-CM

## 2022-03-20 DIAGNOSIS — Z950 Presence of cardiac pacemaker: Secondary | ICD-10-CM

## 2022-03-20 MED ORDER — AMIODARONE HCL 200 MG PO TABS: ORAL_TABLET | ORAL | 3 refills | Status: DC

## 2022-03-20 NOTE — Patient Instructions (Addendum)
Medication Instructions:  Your physician has recommended you make the following change in your medication:   Decrease Amiodarone-  Take one tablet ( 200 mg ) by mouth daily.  Do NOT take ANY Amiodarone on Sundays.    Lab Work: None ordered.  If you have labs (blood work) drawn today and your tests are completely normal, you will receive your results only by: [place] (if you have MyChart) OR A paper copy in the mail If you have any lab test that is abnormal or we need to change your treatment, we will call you to review the results.  Testing/Procedures: None ordered.  Follow-Up: At [facility], you and your health needs are our priority.  As part of our continuing mission to provide you with exceptional heart care, we have created designated Provider Care Teams.  These Care Teams include your primary Cardiologist (physician) and Advanced Practice Providers (APPs -  Physician Assistants and Nurse Practitioners) who all work together to provide you with the care you need, when you need it.  We recommend signing up for the patient portal called "MyChart".  Sign up information is provided on this After Visit Summary.  MyChart is used to connect with patients for Virtual Visits (Telemedicine).  Patients are able to view lab/test results, encounter notes, upcoming appointments, etc.  Non-urgent messages can be sent to your provider as well.   To learn more about what you can do with MyChart, go to [email or web address].    Your next appointment:   1 year(s)  The format for your next appointment:   In Person  Provider:   Cristopher Peru, MD{or one of the following Advanced Practice Providers on your designated Care Team:   Tommye Standard, [place] Legrand Como "Jonni Sanger" Chalmers Cater, [place]  Remote monitoring is used to monitor your Pacemaker from home. This monitoring reduces the number of office visits required to check your device to one time per year. It allows [place] to keep an eye on the functioning of  your device to ensure it is working properly. You are scheduled for a device check from home on 03/21/2022. You may send your transmission at any time that day. If you have a wireless device, the transmission will be sent automatically. After your physician reviews your transmission, you will receive a postcard with your next transmission date.  Important Information About Sugar

## 2022-03-20 NOTE — Telephone Encounter (Signed)
Rhonda [name] that patients device estimated 23 months back in June 2023. Appreciative of call.

## 2022-03-20 NOTE — Telephone Encounter (Signed)
Patient's husband calling to find out what the patient's battery life is.

## 2022-03-20 NOTE — Progress Notes (Signed)
HPI Rhonda [name] returns today for followup. She is a pleasant 88 yo woman with CHB, s/p PPM insertion, PAF who has maintained NSR since starting amiodarone about 6 months ago. She has some mild symptoms of dysequilibrium. She denies palpitations.  Allergies  Allergen Reactions   Azor [Amlodipine-Olmesartan] Other (See Comments)    Higher doses cause ankles to swell   Beta Adrenergic Blockers Other (See Comments)    Bradycardia.   Dabigatran Etexilate Mesylate Other (See Comments)   Norvasc [Amlodipine Besylate] Other (See Comments)    edema   Norvasc [Amlodipine] Other (See Comments)    Edema, lowered heart rate   Parathyroid Hormone (Recomb) Nausea And Vomiting    Other reaction(s): nausea   Pradaxa [Dabigatran Etexilate Mesylate] Other (See Comments)    GI Bleed   Questran [Cholestyramine] Other (See Comments)    Constipation   Sulfa Drugs Cross Reactors Hives   Sulfacetamide Sod-Sulfur Wash [Sulfacet-Sulfur Wash &Cleanser] Other (See Comments)     Current Outpatient Medications  Medication Sig Dispense Refill   amiodarone (PACERONE) 200 MG tablet Take 1 tablet by mouth once daily 90 tablet 3   atorvastatin (LIPITOR) 40 MG tablet Take 40 mg by mouth every evening.   2   brimonidine (ALPHAGAN P) 0.1 % SOLN Place 1 drop into the right eye in the morning, at noon, and at bedtime.     Cholecalciferol (VITAMIN D3) 50 MCG (2000 UT) TABS Take 2,000 Units by mouth daily with lunch.     clobetasol cream (TEMOVATE) 0.05 % Apply 1 application topically as needed.     denosumab (PROLIA) 60 MG/ML SOLN injection Inject 60 mg into the skin every 6 (six) months. Administer in upper arm, thigh, or abdomen     diltiazem (CARDIZEM CD) 120 MG 24 hr capsule TAKE 1 CAPSULE BY MOUTH IN THE MORNING AND AT BEDTIME 180 capsule 3   diltiazem (CARDIZEM) 30 MG tablet TAKE 1 TABLET BY MOUTH EVERY 4 HOURS AS NEEDED FOR  HEART  RATE  GREATER  THAN  100  ,  AS  LONG  AS  BLOOD  PRESSURE  GREATER  THAN   100 45 tablet 0   dorzolamide (TRUSOPT) 2 % ophthalmic solution Place 1 drop into the right eye 3 (three) times daily.     ELIQUIS 2.5 MG TABS tablet Take 2.5 mg by mouth 2 (two) times daily.     fluocinonide cream (LIDEX) 0.05 % Apply 1 application. topically 2 (two) times daily.     furosemide (LASIX) 20 MG tablet Take 60 mg by mouth daily.     ketotifen (ZADITOR) 0.025 % ophthalmic solution Place 1 drop into the left eye 2 (two) times daily.     Latanoprostene Bunod (VYZULTA) 0.024 % SOLN Place 1 drop into the right eye at bedtime.      lidocaine (LMX) 4 % cream Apply 1 application topically daily.     nitroGLYCERIN (NITROSTAT) 0.4 MG SL tablet Place 0.4 mg under the tongue every 5 (five) minutes as needed. For chest pain.     olmesartan (BENICAR) 20 MG tablet Take 10 mg by mouth daily as needed (sbp over 140).     No current facility-administered medications for this visit.     Past Medical History:  Diagnosis Date   Anemia    Bilateral carotid bruits    Coronary disease    s/p PTCA 1990s   GERD (gastroesophageal reflux disease)    takes Protonix daily  GI bleed    previously with pradaxa   Glaucoma    uses Eye Drops daily   H/O hiatal hernia    Hyperlipidemia    takes Atorvastatin daily   Hypertension    takes Azor and Apresoline daily   LBBB (left bundle branch block)    Pacemaker    MDT Advisa May 2013, Dr. Loletha Grayer   PAF (paroxysmal atrial fibrillation) [facility])    PVI ablation May 2013, takes Coumadin daily   Sore throat    since anesthesia in Feb 2015 AND ALLEGERIES    ROS:   All systems reviewed and negative except as noted in the HPI.   Past Surgical History:  Procedure Laterality Date   atrial fibrillation ablation  11/12/11   PVI by Dr Rayann Heman   ATRIAL FIBRILLATION ABLATION N/A 11/12/2011   Procedure: ATRIAL FIBRILLATION ABLATION;  Surgeon: Thompson Grayer, MD;  Location: [facility] CATH LAB;  Service: Cardiovascular;  Laterality: N/A;   CARDIOVERSION N/A 05/26/2019    Procedure: CARDIOVERSION;  Surgeon: Skeet Latch, MD;  Location: [facility] ENDOSCOPY;  Service: Cardiovascular;  Laterality: N/A;   CARDIOVERSION N/A 03/14/2021   Procedure: CARDIOVERSION;  Surgeon: Elouise Munroe, MD;  Location: [facility] ENDOSCOPY;  Service: Cardiovascular;  Laterality: N/A;   CATARACT EXTRACTION W/ INTRAOCULAR LENS  IMPLANT, BILATERAL     COLONOSCOPY     CORONARY ANGIOPLASTY  1990s   by Dr Glade Lloyd   ESOPHAGOGASTRECTOMY     EYE SURGERY     GLAUCOMA SURG LT EYE   KYPHOPLASTY N/A 08/09/2013   Procedure: LUMBAR TWO KYPHOPLASTY;  Surgeon: Kristeen Miss, MD;  Location: [place] NEURO ORS;  Service: Neurosurgery;  Laterality: N/A;  L2 Kyphoplasty   KYPHOPLASTY N/A 09/07/2013   Procedure: L1 KYPHOPLASTY;  Surgeon: Kristeen Miss, MD;  Location: [place] NEURO ORS;  Service: Neurosurgery;  Laterality: N/A;   KYPHOPLASTY N/A 10/04/2013   Procedure: T12 KYPHOPLASTY;  Surgeon: Kristeen Miss, MD;  Location: [place] NEURO ORS;  Service: Neurosurgery;  Laterality: N/A;  T12 KYPHOPLASTY   PACEMAKER INSERTION  11/13/11   MDT implanted by Dr Sallyanne Kuster for 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 observed on EP study   PERMANENT PACEMAKER INSERTION N/A 11/13/2011   Procedure: [place];  Surgeon: Sanda Klein, MD;  Location: [place] CATH LAB;  Service: Cardiovascular;  Laterality: N/A;   TEE WITHOUT CARDIOVERSION  11/11/2011   Procedure: TRANSESOPHAGEAL ECHOCARDIOGRAM (TEE);  Surgeon: Pixie Casino, MD;  Location: [facility] ENDOSCOPY;  Service: Cardiovascular;  Laterality: N/A;   TEE WITHOUT CARDIOVERSION N/A 05/26/2019   Procedure: TRANSESOPHAGEAL ECHOCARDIOGRAM (TEE);  Surgeon: Skeet Latch, MD;  Location: [place];  Service: Cardiovascular;  Laterality: N/A;   TEE WITHOUT CARDIOVERSION N/A 03/14/2021   Procedure: TRANSESOPHAGEAL ECHOCARDIOGRAM (TEE);  Surgeon: Elouise Munroe, MD;  Location: [facility] ENDOSCOPY;  Service: Cardiovascular;  Laterality: N/A;   TEMPORARY PACEMAKER INSERTION  11/12/2011    Procedure: TEMPORARY PACEMAKER INSERTION;  Surgeon: Thompson Grayer, MD;  Location: [facility] CATH LAB;  Service: Cardiovascular;;     Family History  Problem Relation Age of Onset   Heart disease Mother    Heart disease Father    Heart disease Brother    Cancer Maternal Grandmother    Heart disease Maternal Grandfather    Pneumonia Paternal Grandmother    Pneumonia Paternal Grandfather    Heart disease Brother    Heart disease Brother    Pneumonia Sister      Social History   Socioeconomic History   Marital status: Married  Spouse name: Not on file   Number of children: Not on file   Years of education: Not on file   Highest education level: Not on file  Occupational History   Not on file  Tobacco Use   Smoking status: Never   Smokeless tobacco: Never  Vaping Use   Vaping Use: Never used  Substance and Sexual Activity   Alcohol use: No   Drug use: No   Sexual activity: Not Currently    Birth control/protection: Post-menopausal  Other Topics Concern   Not on file  Social History Narrative   Pt lives in [place].  Retired form [employer].   Attends [employer]   Social Determinants of Health   Financial Resource Strain: Not on file  Food Insecurity: Not on file  Transportation Needs: Not on file  Physical Activity: Not on file  Stress: Not on file  Social Connections: Not on file  Intimate Partner Violence: Not on file     BP (!) 162/74   Pulse 76   Ht 5\' 4"  (1.626 m)   Wt 106 lb 6.4 oz (48.3 kg)   SpO2 96%   BMI 18.26 kg/m   Physical Exam:  Well appearing NAD HEENT: Unremarkable Neck:  No JVD, no thyromegally Lymphatics:  No adenopathy Back:  No CVA tenderness Lungs:  Clear with no wheezes HEART:  Regular rate rhythm, no murmurs, no rubs, no clicks Abd:  soft, positive bowel sounds, no organomegally, no rebound, no guarding Ext:  2 plus pulses, no edema, no cyanosis, no clubbing Skin:  No rashes no nodules Neuro:  CN II through XII intact,  motor grossly intact  EKG - nsr with ventricular pacing  DEVICE  Normal device function.  See PaceArt for details.   Assess/Plan:  CHB - she is asymptomatic s/p PPM insertion. PPM - her medtronic DDD PM has about 2 years of longevity. We will follow. HTN - her bp is high but she brings in her bp checks and sbp is all below 120.  PAF - she is maintaining NSR on amiodarone. I asked her to reduce her dose to 200 mg daily, none on Sunday.   Saturday Debbe Crumble,MD

## 2022-03-21 ENCOUNTER — Ambulatory Visit (INDEPENDENT_AMBULATORY_CARE_PROVIDER_SITE_OTHER): Payer: Medicare Other

## 2022-03-21 DIAGNOSIS — I441 Atrioventricular block, second degree: Secondary | ICD-10-CM

## 2022-03-22 LAB — CUP PACEART REMOTE DEVICE CHECK
Battery Impedance: 2276 Ohm
Battery Remaining Longevity: 29 mo
Battery Voltage: 2.73 V
Brady Statistic AP VP Percent: 99 %
Brady Statistic AP VS Percent: 0 %
Brady Statistic AS VP Percent: 1 %
Brady Statistic AS VS Percent: 0 %
Date Time Interrogation Session: 20230921101511
Implantable Lead Implant Date: 20130515
Implantable Lead Implant Date: 20130515
Implantable Lead Location: 753859
Implantable Lead Location: 753860
Implantable Lead Model: 5076
Implantable Lead Model: 5076
Implantable Pulse Generator Implant Date: 20130515
Lead Channel Impedance Value: 440 Ohm
Lead Channel Impedance Value: 829 Ohm
Lead Channel Pacing Threshold Amplitude: 0.375 V
Lead Channel Pacing Threshold Amplitude: 1.125 V
Lead Channel Pacing Threshold Pulse Width: 0.4 ms
Lead Channel Pacing Threshold Pulse Width: 0.4 ms
Lead Channel Setting Pacing Amplitude: 2 V
Lead Channel Setting Pacing Amplitude: 2.5 V
Lead Channel Setting Pacing Pulse Width: 0.4 ms
Lead Channel Setting Sensing Sensitivity: 5.6 mV

## 2022-04-01 NOTE — Progress Notes (Signed)
Remote pacemaker transmission.   

## 2022-06-03 ENCOUNTER — Encounter: Payer: Medicare Other | Admitting: Internal Medicine

## 2022-06-11 DIAGNOSIS — Z23 Encounter for immunization: Secondary | ICD-10-CM | POA: Diagnosis not present

## 2022-06-20 ENCOUNTER — Telehealth: Payer: Self-pay

## 2022-06-20 NOTE — Telephone Encounter (Signed)
Pt is calling back to get update on transmission and if it was received. Please advise.

## 2022-06-20 NOTE — Progress Notes (Signed)
Letter sent.

## 2022-06-20 NOTE — Telephone Encounter (Signed)
The patient called to get help sending the transmission. The patient asked me to call him back when I see the transmission.

## 2022-06-20 NOTE — Telephone Encounter (Signed)
Monitor ordered

## 2022-06-28 ENCOUNTER — Ambulatory Visit (INDEPENDENT_AMBULATORY_CARE_PROVIDER_SITE_OTHER): Payer: Medicare Other

## 2022-06-28 DIAGNOSIS — I441 Atrioventricular block, second degree: Secondary | ICD-10-CM

## 2022-06-28 NOTE — Telephone Encounter (Signed)
Transmission received 06/28/2022.

## 2022-06-28 NOTE — Telephone Encounter (Signed)
The patient husband states the patient monitor has been received today. I told him to let the monitor charge for one hour and I will give them a call back around 3 pm.

## 2022-07-02 LAB — CUP PACEART REMOTE DEVICE CHECK
Battery Impedance: 2384 Ohm
Battery Remaining Longevity: 27 mo
Battery Voltage: 2.72 V
Brady Statistic AP VP Percent: 100 %
Brady Statistic AP VS Percent: 0 %
Brady Statistic AS VP Percent: 0 %
Brady Statistic AS VS Percent: 0 %
Date Time Interrogation Session: 20231229150548
Implantable Lead Connection Status: 753985
Implantable Lead Connection Status: 753985
Implantable Lead Implant Date: 20130515
Implantable Lead Implant Date: 20130515
Implantable Lead Location: 753859
Implantable Lead Location: 753860
Implantable Lead Model: 5076
Implantable Lead Model: 5076
Implantable Pulse Generator Implant Date: 20130515
Lead Channel Impedance Value: 427 Ohm
Lead Channel Impedance Value: 829 Ohm
Lead Channel Pacing Threshold Amplitude: 0.375 V
Lead Channel Pacing Threshold Amplitude: 1.25 V
Lead Channel Pacing Threshold Pulse Width: 0.4 ms
Lead Channel Pacing Threshold Pulse Width: 0.4 ms
Lead Channel Setting Pacing Amplitude: 2 V
Lead Channel Setting Pacing Amplitude: 2.5 V
Lead Channel Setting Pacing Pulse Width: 0.4 ms
Lead Channel Setting Sensing Sensitivity: 5.6 mV
Zone Setting Status: 755011
Zone Setting Status: 755011

## 2022-07-15 NOTE — Progress Notes (Signed)
Remote pacemaker transmission.   

## 2022-08-08 ENCOUNTER — Encounter (HOSPITAL_COMMUNITY): Payer: Self-pay | Admitting: *Deleted

## 2022-09-27 ENCOUNTER — Ambulatory Visit (INDEPENDENT_AMBULATORY_CARE_PROVIDER_SITE_OTHER): Payer: Medicare Other

## 2022-09-27 DIAGNOSIS — I441 Atrioventricular block, second degree: Secondary | ICD-10-CM

## 2022-09-27 LAB — CUP PACEART REMOTE DEVICE CHECK
Battery Impedance: 2357 Ohm
Battery Remaining Longevity: 27 mo
Battery Voltage: 2.73 V
Brady Statistic AP VP Percent: 100 %
Brady Statistic AP VS Percent: 0 %
Brady Statistic AS VP Percent: 0 %
Brady Statistic AS VS Percent: 0 %
Date Time Interrogation Session: 20240329100045
Implantable Lead Connection Status: 753985
Implantable Lead Connection Status: 753985
Implantable Lead Implant Date: 20130515
Implantable Lead Implant Date: 20130515
Implantable Lead Location: 753859
Implantable Lead Location: 753860
Implantable Lead Model: 5076
Implantable Lead Model: 5076
Implantable Pulse Generator Implant Date: 20130515
Lead Channel Impedance Value: 425 Ohm
Lead Channel Impedance Value: 844 Ohm
Lead Channel Pacing Threshold Amplitude: 0.375 V
Lead Channel Pacing Threshold Amplitude: 1.25 V
Lead Channel Pacing Threshold Pulse Width: 0.4 ms
Lead Channel Pacing Threshold Pulse Width: 0.4 ms
Lead Channel Setting Pacing Amplitude: 2 V
Lead Channel Setting Pacing Amplitude: 2.5 V
Lead Channel Setting Pacing Pulse Width: 0.4 ms
Lead Channel Setting Sensing Sensitivity: 5.6 mV
Zone Setting Status: 755011
Zone Setting Status: 755011

## 2022-10-30 NOTE — Progress Notes (Signed)
Remote pacemaker transmission.   

## 2022-11-13 ENCOUNTER — Other Ambulatory Visit: Payer: Self-pay | Admitting: Physician Assistant

## 2022-12-03 ENCOUNTER — Telehealth: Payer: Self-pay | Admitting: Internal Medicine

## 2022-12-03 DIAGNOSIS — Z79899 Other long term (current) drug therapy: Secondary | ICD-10-CM

## 2022-12-03 DIAGNOSIS — I10 Essential (primary) hypertension: Secondary | ICD-10-CM

## 2022-12-03 MED ORDER — CARTIA XT 120 MG PO CP24: 120.0000 mg | ORAL_CAPSULE | Freq: Two times a day (BID) | ORAL | 2 refills | Status: DC

## 2022-12-03 NOTE — Telephone Encounter (Signed)
Pt contacted message received in [facility] Triage.   Pt was unable to obtain Cartia XT 120 mg, name brand as ordered at her pharmacy;  She wanted to know if a generic Diltiazem would work, as replacement per pharmacist? Pt was concerned that it could interact with Amiodarone.   I consulted Pharm D, and Cartia XT and diltiazem generic same, and no contraindications with taking generic vs name brand Cartia XT.  Pt advise will send in Cartia XT 120 mg order to [facility] on [address] per Pt request;  If they don't have medication, per Pharm D, ok to have diltiazem generic filled in its place.  Pt verbalized understanding.  No follow up required at this time.

## 2022-12-03 NOTE — Telephone Encounter (Signed)
Pt c/o medication issue:  1. Name of Medication: CARTIA XT 120 MG 24 hr capsule   2. How are you currently taking this medication (dosage and times per day)?    3. Are you having a reaction (difficulty breathing--STAT)? no  4. What is your medication issue? Patient states that the pharmacies dont have medication is stock. Calling to see if she is okay to take the generic brand. And also wanted to know she take the generic would it affect amiodarone (PACERONE) 200 MG tablet Please advise

## 2022-12-06 MED ORDER — DILTIAZEM HCL ER COATED BEADS 120 MG PO CP24: 120.0000 mg | ORAL_CAPSULE | Freq: Two times a day (BID) | ORAL | 1 refills | Status: DC

## 2022-12-06 NOTE — Addendum Note (Signed)
Addended by: Lendon Ka on: 12/06/2022 03:50 PM   Modules accepted: Orders

## 2022-12-06 NOTE — Telephone Encounter (Signed)
Sent diltiazem 120 mg (Cardizem CD) BID prescription to [facility].  Call patient and let her know.  Adv the office does not carry samples of this medications.     Per patient Rhonda [name] said there is a shortage of this medication and they don't have any.  She still has a small supply.  I checked with the [facility] pharmacy who does not know about a shortage and they can fill it if patient requests.  For now she is okay.

## 2022-12-06 NOTE — Telephone Encounter (Signed)
Pt called in stating the pharmacy does not have Cartia, she ask that the generic brand be sent in instead.   diltiazem (CARDIZEM) 30 MG tablet    She also wanted to ask if the office carries samples of Cartia she can have. Please advise.

## 2022-12-27 ENCOUNTER — Ambulatory Visit (INDEPENDENT_AMBULATORY_CARE_PROVIDER_SITE_OTHER): Payer: Medicare Other

## 2022-12-27 DIAGNOSIS — I441 Atrioventricular block, second degree: Secondary | ICD-10-CM

## 2022-12-27 LAB — CUP PACEART REMOTE DEVICE CHECK
Battery Impedance: 2488 Ohm
Battery Remaining Longevity: 25 mo
Battery Voltage: 2.72 V
Brady Statistic AP VP Percent: 100 %
Brady Statistic AP VS Percent: 0 %
Brady Statistic AS VP Percent: 0 %
Brady Statistic AS VS Percent: 0 %
Date Time Interrogation Session: 20240628100511
Implantable Lead Connection Status: 753985
Implantable Lead Connection Status: 753985
Implantable Lead Implant Date: 20130515
Implantable Lead Implant Date: 20130515
Implantable Lead Location: 753859
Implantable Lead Location: 753860
Implantable Lead Model: 5076
Implantable Lead Model: 5076
Implantable Pulse Generator Implant Date: 20130515
Lead Channel Impedance Value: 431 Ohm
Lead Channel Impedance Value: 797 Ohm
Lead Channel Pacing Threshold Amplitude: 0.375 V
Lead Channel Pacing Threshold Amplitude: 1.125 V
Lead Channel Pacing Threshold Pulse Width: 0.4 ms
Lead Channel Pacing Threshold Pulse Width: 0.4 ms
Lead Channel Setting Pacing Amplitude: 2 V
Lead Channel Setting Pacing Amplitude: 2.5 V
Lead Channel Setting Pacing Pulse Width: 0.4 ms
Lead Channel Setting Sensing Sensitivity: 5.6 mV
Zone Setting Status: 755011
Zone Setting Status: 755011

## 2023-01-01 ENCOUNTER — Other Ambulatory Visit (HOSPITAL_COMMUNITY): Payer: Self-pay

## 2023-01-01 ENCOUNTER — Telehealth: Payer: Self-pay | Admitting: Internal Medicine

## 2023-01-01 MED ORDER — DILTIAZEM HCL ER COATED BEADS 120 MG PO CP24
120.0000 mg | ORAL_CAPSULE | Freq: Two times a day (BID) | ORAL | 1 refills | Status: DC
  Filled 2023-01-01: qty 180, 90d supply, fill #0
  Filled 2023-01-01: qty 120, 60d supply, fill #0

## 2023-01-01 NOTE — Telephone Encounter (Signed)
Pt c/o medication issue:  1. Name of Medication:  diltiazem (CARDIZEM CD) 120 MG 24 hr capsule   2. How are you currently taking this medication (dosage and times per day)? As prescribed   3. Are you having a reaction (difficulty breathing--STAT)? No   4. What is your medication issue? Patient is calling stating she is having trouble finding a pharmacy that can fill this medication again and she is about to run out. Please advise.

## 2023-01-01 NOTE — Telephone Encounter (Signed)
Pt aware this is available at [facility]. Rx sent in, pt will pick it up today. She appreciates the help with this.

## 2023-01-09 NOTE — Progress Notes (Signed)
Remote pacemaker transmission.   

## 2023-01-15 ENCOUNTER — Telehealth: Payer: Self-pay | Admitting: Internal Medicine

## 2023-01-15 MED ORDER — DILTIAZEM HCL 30 MG PO TABS: 30.0000 mg | ORAL_TABLET | ORAL | 0 refills | Status: AC | PRN

## 2023-01-15 NOTE — Telephone Encounter (Signed)
Called patient and patient's husband back about message. Patient would like to get a refill on her Cardizem 30 mg PRN for HR over 100. Patient stated right now her BP and HR are 136/78 and 79. Patient would like to know if she can take expired medication. Informed patient that she should pick up new prescription from her pharmacy. Encouraged patient to only take prn Cardizem when her HR is over 100. Patient asked about her BP, informed patient that her BP was fine and she does not take extra Cardizem 30 mg for SBP over 100. Patient stated she has been going in and out of [facility] and complaining about her recent medication she picked up from the pharmacy. Patient was worried since it was a Arts development officer. Informed patient that she could see [facility] if she keeps having issues. Patient stated she would call them tomorrow if she goes back into [facility]. Will send message to [facility], so they are aware.

## 2023-01-15 NOTE — Telephone Encounter (Signed)
Pt c/o medication issue:  1. Name of Medication:   diltiazem (CARDIZEM) 30 MG tablet   2. How are you currently taking this medication (dosage and times per day)?   As prescribed  3. Are you having a reaction (difficulty breathing--STAT)?   4. What is your medication issue?    Husband stated patient's current medication bottle shows an expiration date of 05/03/2022 and wants to know if patient can still take this medication.  Husband states if not they will need a current prescription sent to [facility] [address] [place], [place] - [address].  Caller also noted patient's HR has been over 100.

## 2023-03-04 ENCOUNTER — Ambulatory Visit: Payer: Medicare Other | Attending: Internal Medicine | Admitting: Internal Medicine

## 2023-03-04 VITALS — BP 170/86 | HR 75 | Ht 64.0 in | Wt 102.0 lb

## 2023-03-04 DIAGNOSIS — I4819 Other persistent atrial fibrillation: Secondary | ICD-10-CM

## 2023-03-04 DIAGNOSIS — I441 Atrioventricular block, second degree: Secondary | ICD-10-CM | POA: Diagnosis not present

## 2023-03-04 LAB — CUP PACEART INCLINIC DEVICE CHECK
Battery Impedance: 2614 Ohm
Battery Remaining Longevity: 24 mo
Battery Voltage: 2.72 V
Brady Statistic AP VP Percent: 99 %
Brady Statistic AP VS Percent: 0 %
Brady Statistic AS VP Percent: 1 %
Brady Statistic AS VS Percent: 0 %
Date Time Interrogation Session: 20240903142053
Implantable Lead Connection Status: 753985
Implantable Lead Connection Status: 753985
Implantable Lead Implant Date: 20130515
Implantable Lead Implant Date: 20130515
Implantable Lead Location: 753859
Implantable Lead Location: 753860
Implantable Lead Model: 5076
Implantable Lead Model: 5076
Implantable Pulse Generator Implant Date: 20130515
Lead Channel Impedance Value: 429 Ohm
Lead Channel Impedance Value: 812 Ohm
Lead Channel Pacing Threshold Amplitude: 0.75 V
Lead Channel Pacing Threshold Amplitude: 1.125 V
Lead Channel Pacing Threshold Amplitude: 1.25 V
Lead Channel Pacing Threshold Pulse Width: 0.4 ms
Lead Channel Pacing Threshold Pulse Width: 0.4 ms
Lead Channel Pacing Threshold Pulse Width: 0.4 ms
Lead Channel Setting Pacing Amplitude: 2 V
Lead Channel Setting Pacing Amplitude: 2.5 V
Lead Channel Setting Pacing Pulse Width: 0.4 ms
Lead Channel Setting Sensing Sensitivity: 5.6 mV
Zone Setting Status: 755011
Zone Setting Status: 755011

## 2023-03-04 NOTE — Progress Notes (Signed)
HPI Rhonda [name] returns today for followup. She is a pleasant 89 yo woman with CHB, s/p PPM insertion, PAF who has maintained NSR since starting amiodarone about 6 months ago. She has some mild symptoms of dysequilibrium. She denies palpitations.  Allergies  Allergen Reactions   Azor [Amlodipine-Olmesartan] Other (See Comments)    Higher doses cause ankles to swell   Beta Adrenergic Blockers Other (See Comments)    Bradycardia.   Dabigatran Etexilate Mesylate Other (See Comments)   Norvasc [Amlodipine Besylate] Other (See Comments)    edema   Norvasc [Amlodipine] Other (See Comments)    Edema, lowered heart rate   Parathyroid Hormone (Recomb) Nausea And Vomiting    Other reaction(s): nausea   Pradaxa [Dabigatran Etexilate Mesylate] Other (See Comments)    GI Bleed   Questran [Cholestyramine] Other (See Comments)    Constipation   Sulfa Drugs Cross Reactors Hives   Sulfacetamide Sod-Sulfur Wash [Sulfacet-Sulfur Wash &Cleanser] Other (See Comments)     Current Outpatient Medications  Medication Sig Dispense Refill   amiodarone (PACERONE) 200 MG tablet Take one tablet ( 200 mg ) by mouth daily.  Do NOT take any Amiodarone on Sundays. 90 tablet 3   atorvastatin (LIPITOR) 40 MG tablet Take 40 mg by mouth every evening.   2   brimonidine (ALPHAGAN P) 0.1 % SOLN Place 1 drop into the right eye in the morning, at noon, and at bedtime.     Cholecalciferol (VITAMIN D3) 50 MCG (2000 UT) TABS Take 2,000 Units by mouth daily with lunch.     clobetasol cream (TEMOVATE) 0.05 % Apply 1 application topically as needed.     denosumab (PROLIA) 60 MG/ML SOLN injection Inject 60 mg into the skin every 6 (six) months. Administer in upper arm, thigh, or abdomen     diltiazem (CARDIZEM CD) 120 MG 24 hr capsule Take 1 capsule (120 mg total) by mouth 2 (two) times daily. 180 capsule 1   diltiazem (CARDIZEM) 30 MG tablet Take 1 tablet (30 mg total) by mouth every 4 (four) hours as needed (for heart  rate over 100). 45 tablet 0   dorzolamide (TRUSOPT) 2 % ophthalmic solution Place 1 drop into the right eye 3 (three) times daily.     ELIQUIS 2.5 MG TABS tablet Take 2.5 mg by mouth 2 (two) times daily.     fluocinonide cream (LIDEX) 0.05 % Apply 1 application. topically 2 (two) times daily.     furosemide (LASIX) 20 MG tablet Take 60 mg by mouth daily.     ketotifen (ZADITOR) 0.025 % ophthalmic solution Place 1 drop into the left eye 2 (two) times daily.     Latanoprostene Bunod (VYZULTA) 0.024 % SOLN Place 1 drop into the right eye at bedtime.      lidocaine (LMX) 4 % cream Apply 1 application topically daily.     nitroGLYCERIN (NITROSTAT) 0.4 MG SL tablet Place 0.4 mg under the tongue every 5 (five) minutes as needed. For chest pain.     olmesartan (BENICAR) 20 MG tablet Take 10 mg by mouth daily as needed (sbp over 140).     No current facility-administered medications for this visit.     Past Medical History:  Diagnosis Date   Anemia    Bilateral carotid bruits    Coronary disease    s/p PTCA 1990s   GERD (gastroesophageal reflux disease)    takes Protonix daily   GI bleed    previously with pradaxa  Glaucoma    uses Eye Drops daily   H/O hiatal hernia    Hyperlipidemia    takes Atorvastatin daily   Hypertension    takes Azor and Apresoline daily   LBBB (left bundle branch block)    Pacemaker    MDT Advisa May 2013, Dr. Salena Saner   PAF (paroxysmal atrial fibrillation) [facility])    PVI ablation May 2013, takes Coumadin daily   Sore throat    since anesthesia in Feb 2015 AND ALLEGERIES    ROS:   All systems reviewed and negative except as noted in the HPI.   Past Surgical History:  Procedure Laterality Date   atrial fibrillation ablation  11/12/11   PVI by Dr Johney Frame   ATRIAL FIBRILLATION ABLATION N/A 11/12/2011   Procedure: ATRIAL FIBRILLATION ABLATION;  Surgeon: [name], MD;  Location: [facility] CATH LAB;  Service: Cardiovascular;  Laterality: N/A;   CARDIOVERSION N/A  05/26/2019   Procedure: CARDIOVERSION;  Surgeon: Chilton Si, MD;  Location: [facility] ENDOSCOPY;  Service: Cardiovascular;  Laterality: N/A;   CARDIOVERSION N/A 03/14/2021   Procedure: CARDIOVERSION;  Surgeon: Parke Poisson, MD;  Location: [facility] ENDOSCOPY;  Service: Cardiovascular;  Laterality: N/A;   CATARACT EXTRACTION W/ INTRAOCULAR LENS  IMPLANT, BILATERAL     COLONOSCOPY     CORONARY ANGIOPLASTY  1990s   by Dr Aleen Campi   ESOPHAGOGASTRECTOMY     EYE SURGERY     GLAUCOMA SURG LT EYE   KYPHOPLASTY N/A 08/09/2013   Procedure: LUMBAR TWO KYPHOPLASTY;  Surgeon: Barnett Abu, MD;  Location: [facility] NEURO ORS;  Service: Neurosurgery;  Laterality: N/A;  L2 Kyphoplasty   KYPHOPLASTY N/A 09/07/2013   Procedure: L1 KYPHOPLASTY;  Surgeon: Barnett Abu, MD;  Location: [facility] NEURO ORS;  Service: Neurosurgery;  Laterality: N/A;   KYPHOPLASTY N/A 10/04/2013   Procedure: T12 KYPHOPLASTY;  Surgeon: Barnett Abu, MD;  Location: [facility] NEURO ORS;  Service: Neurosurgery;  Laterality: N/A;  T12 KYPHOPLASTY   PACEMAKER INSERTION  11/13/11   MDT implanted by Dr Royann Shivers for 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 observed on EP study   PERMANENT PACEMAKER INSERTION N/A 11/13/2011   Procedure: PERMANENT PACEMAKER INSERTION;  Surgeon: Thurmon Fair, MD;  Location: [facility] CATH LAB;  Service: Cardiovascular;  Laterality: N/A;   TEE WITHOUT CARDIOVERSION  11/11/2011   Procedure: TRANSESOPHAGEAL ECHOCARDIOGRAM (TEE);  Surgeon: Chrystie Nose, MD;  Location: [facility] ENDOSCOPY;  Service: Cardiovascular;  Laterality: N/A;   TEE WITHOUT CARDIOVERSION N/A 05/26/2019   Procedure: TRANSESOPHAGEAL ECHOCARDIOGRAM (TEE);  Surgeon: Chilton Si, MD;  Location: [facility] ENDOSCOPY;  Service: Cardiovascular;  Laterality: N/A;   TEE WITHOUT CARDIOVERSION N/A 03/14/2021   Procedure: TRANSESOPHAGEAL ECHOCARDIOGRAM (TEE);  Surgeon: Parke Poisson, MD;  Location: [facility] ENDOSCOPY;  Service: Cardiovascular;  Laterality: N/A;   TEMPORARY PACEMAKER INSERTION   11/12/2011   Procedure: TEMPORARY PACEMAKER INSERTION;  Surgeon: [name], MD;  Location: [facility] CATH LAB;  Service: Cardiovascular;;     Family History  Problem Relation Age of Onset   Heart disease Mother    Heart disease Father    Heart disease Brother    Cancer Maternal Grandmother    Heart disease Maternal Grandfather    Pneumonia Paternal Grandmother    Pneumonia Paternal Grandfather    Heart disease Brother    Heart disease Brother    Pneumonia Sister      Social History   Socioeconomic History   Marital status: Married    Spouse name: Not on file   Number of children:  Not on file   Years of education: Not on file   Highest education level: Not on file  Occupational History   Not on file  Tobacco Use   Smoking status: Never   Smokeless tobacco: Never  Vaping Use   Vaping status: Never Used  Substance and Sexual Activity   Alcohol use: No   Drug use: No   Sexual activity: Not Currently    Birth control/protection: Post-menopausal  Other Topics Concern   Not on file  Social History Narrative   Pt lives in [place].  Retired form [employer].   Attends [employer]   Social Determinants of Health   Financial Resource Strain: Not on file  Food Insecurity: Not on file  Transportation Needs: Not on file  Physical Activity: Not on file  Stress: Not on file  Social Connections: Not on file  Intimate Partner Violence: Not on file     BP (!) 170/86   Pulse 75   Ht 5\' 4"  (1.626 m)   Wt 102 lb (46.3 kg)   SpO2 96%   BMI 17.51 kg/m   Physical Exam:  Well appearing NAD HEENT: Unremarkable Neck:  No JVD, no thyromegally Lymphatics:  No adenopathy Back:  No CVA tenderness Lungs:  Clear HEART:  Regular rate rhythm, no murmurs, no rubs, no clicks Abd:  soft, positive bowel sounds, no organomegally, no rebound, no guarding Ext:  2 plus pulses, no edema, no cyanosis, no clubbing Skin:  No rashes no nodules Neuro:  CN II through XII intact, motor  grossly intact  EKG  DEVICE  Normal device function.  See PaceArt for details.   Assess/Plan:  CHB - she is asymptomatic s/p PPM insertion. PPM - her medtronic DDD PM has about 2 years of longevity. We will follow. HTN - her bp is high but she brings in her bp checks and sbp is all below 120.  PAF - she is maintaining NSR on amiodarone. I asked her to continue her dose 200 mg daily, none on Sunday.    Sharlot Gowda Muna Demers,MD

## 2023-03-04 NOTE — Patient Instructions (Addendum)
Medication Instructions:  Your physician recommends that you continue on your current medications as directed. Please refer to the Current Medication list given to you today.  *If you need a refill on your cardiac medications before your next appointment, please call your pharmacy*  Lab Work: None ordered.  If you have labs (blood work) drawn today and your tests are completely normal, you will receive your results only by: MyChart Message (if you have MyChart) OR A paper copy in the mail If you have any lab test that is abnormal or we need to change your treatment, we will call you to review the results.  Testing/Procedures: None ordered.  Follow-Up: At [facility], you and your health needs are our priority.  As part of our continuing mission to provide you with exceptional heart care, we have created designated Provider Care Teams.  These Care Teams include your primary Cardiologist (physician) and Advanced Practice Providers (APPs -  Physician Assistants and Nurse Practitioners) who all work together to provide you with the care you need, when you need it.  Your next appointment:   1 year(s)  The format for your next appointment:   In Person  Provider:   Lewayne Bunting, MD{or one of the following Advanced Practice Providers on your designated Care Team:   Francis Dowse, [place] Casimiro Needle "Mardelle Matte" [place], [place] Earnest Rosier, NP  Remote monitoring is used to monitor your Pacemaker/ ICD from home. This monitoring reduces the number of office visits required to check your device to one time per year. It allows [place] to keep an eye on the functioning of your device to ensure it is working properly. You are scheduled for a device check from home on 03/28/23. You may send your transmission at any time that day. If you have a wireless device, the transmission will be sent automatically. After your physician reviews your transmission, you will receive a postcard with your next transmission  date.  Important Information About Sugar

## 2023-03-13 ENCOUNTER — Other Ambulatory Visit: Payer: Self-pay | Admitting: Internal Medicine

## 2023-03-20 ENCOUNTER — Telehealth: Payer: Self-pay | Admitting: Internal Medicine

## 2023-03-20 NOTE — Telephone Encounter (Signed)
Patient stated she will need assistance with sending her transmission on 9/27.

## 2023-03-27 NOTE — Telephone Encounter (Signed)
I spoke with the patient and let her know I will call her tomorrow between 10:30-11 am.

## 2023-03-28 ENCOUNTER — Ambulatory Visit (INDEPENDENT_AMBULATORY_CARE_PROVIDER_SITE_OTHER): Payer: Medicare Other

## 2023-03-28 DIAGNOSIS — I441 Atrioventricular block, second degree: Secondary | ICD-10-CM | POA: Diagnosis not present

## 2023-03-28 NOTE — Telephone Encounter (Signed)
I help the patient and her neighbor send manual transmission.  Transmission received 03/28/2023.

## 2023-03-29 LAB — CUP PACEART REMOTE DEVICE CHECK
Battery Impedance: 2610 Ohm
Battery Remaining Longevity: 24 mo
Battery Voltage: 2.71 V
Brady Statistic AP VP Percent: 99 %
Brady Statistic AP VS Percent: 0 %
Brady Statistic AS VP Percent: 1 %
Brady Statistic AS VS Percent: 0 %
Date Time Interrogation Session: 20240927094208
Implantable Lead Connection Status: 753985
Implantable Lead Connection Status: 753985
Implantable Lead Implant Date: 20130515
Implantable Lead Implant Date: 20130515
Implantable Lead Location: 753859
Implantable Lead Location: 753860
Implantable Lead Model: 5076
Implantable Lead Model: 5076
Implantable Pulse Generator Implant Date: 20130515
Lead Channel Impedance Value: 412 Ohm
Lead Channel Impedance Value: 784 Ohm
Lead Channel Pacing Threshold Amplitude: 0.5 V
Lead Channel Pacing Threshold Amplitude: 1.125 V
Lead Channel Pacing Threshold Pulse Width: 0.4 ms
Lead Channel Pacing Threshold Pulse Width: 0.4 ms
Lead Channel Setting Pacing Amplitude: 2 V
Lead Channel Setting Pacing Amplitude: 2.5 V
Lead Channel Setting Pacing Pulse Width: 0.4 ms
Lead Channel Setting Sensing Sensitivity: 5.6 mV
Zone Setting Status: 755011
Zone Setting Status: 755011

## 2023-04-03 NOTE — Progress Notes (Signed)
Remote pacemaker transmission.   

## 2023-04-30 ENCOUNTER — Telehealth: Payer: Self-pay | Admitting: *Deleted

## 2023-04-30 NOTE — Telephone Encounter (Signed)
   Pre-operative Risk Assessment    Patient Name: Rhonda [name]  DOB: 1930-01-02 MRN: [id number]    DATE OF LAST VISIT: 03/04/23 DR. Ladona Ridgel DATE OF NEXT VISIT: NONE Request for Surgical Clearance    Procedure:  Dental Extraction - Amount of Teeth to be Pulled:  EXTRACTION OF 1 TOOTH ; FORM DOES NOT STATE IF SURGICAL OR SIMPLE EXTRACTION  Date of Surgery:  Clearance TBD                                 Surgeon:  DR. Irving Copas, DDS Surgeon's Group or Practice Name:  [facility] DENT[facility] Phone number:  [phone]  Fax number:  [phone]   Type of Clearance Requested:   - Medical  - Pharmacy:  Hold Apixaban (Eliquis)     Type of Anesthesia:  Local  2% LIDOCAINE w/EPI   Additional requests/questions:    Elpidio Anis   04/30/2023, 10:29 AM

## 2023-04-30 NOTE — Telephone Encounter (Signed)
   Patient Name: SHAYDA CURTSINGER  DOB: 1930/02/23 MRN: [id number]  Primary Cardiologist: None  Chart reviewed as part of pre-operative protocol coverage.  Simple dental extractions (i.e. 1-2 teeth) are considered low risk procedures per guidelines and generally do not require any specific cardiac clearance. It is also generally accepted that for simple extractions and dental cleanings, there is no need to interrupt blood thinner therapy.   SBE prophylaxis is not required for the patient from a cardiac standpoint.  I will route this recommendation to the requesting party via Epic fax function and remove from pre-op pool.  Please call with questions.  Napoleon Form, Leodis Rains, NP 04/30/2023, 10:51 AM

## 2023-07-03 ENCOUNTER — Telehealth: Payer: Self-pay

## 2023-07-03 ENCOUNTER — Ambulatory Visit (INDEPENDENT_AMBULATORY_CARE_PROVIDER_SITE_OTHER): Payer: Medicare Other

## 2023-07-03 DIAGNOSIS — I441 Atrioventricular block, second degree: Secondary | ICD-10-CM

## 2023-07-03 LAB — CUP PACEART REMOTE DEVICE CHECK
Battery Impedance: 3463 Ohm
Battery Voltage: 2.73 V
Brady Statistic RV Percent Paced: 100 %
Date Time Interrogation Session: 20250102100019
Implantable Lead Connection Status: 753985
Implantable Lead Connection Status: 753985
Implantable Lead Implant Date: 20130515
Implantable Lead Implant Date: 20130515
Implantable Lead Location: 753859
Implantable Lead Location: 753860
Implantable Lead Model: 5076
Implantable Lead Model: 5076
Implantable Pulse Generator Implant Date: 20130515
Lead Channel Impedance Value: 67 Ohm
Lead Channel Impedance Value: 758 Ohm
Lead Channel Setting Pacing Amplitude: 2.5 V
Lead Channel Setting Pacing Pulse Width: 0.4 ms
Lead Channel Setting Sensing Sensitivity: 5.6 mV
Zone Setting Status: 755011
Zone Setting Status: 755011

## 2023-07-03 NOTE — Telephone Encounter (Signed)
 Scheduled remote reviewed. Normal device function.   Device reached RRT 06/23/23 - route to triage Next remote 91 days. LA, CVRS

## 2023-07-03 NOTE — Telephone Encounter (Signed)
 Spoke to patient to advise device at RRT and will need apt to discuss gen change. Routing to EP scheduler for apt.

## 2023-07-03 NOTE — Telephone Encounter (Signed)
 Device is an adapta. Switched to VVI. Attempting to contact patient, assess for symptoms, and schedule with GT; However, pts spouse keeps answering the phone and cannot hear me. Will search for other methods of contact in the chart.

## 2023-07-09 NOTE — Telephone Encounter (Signed)
 Outreach made to [employer] to confirm battery is guaranted for 3 months s/p ERI.  Per [employer] rep-device is guaranteed.    Follow up scheduled.

## 2023-07-09 NOTE — Addendum Note (Signed)
 Addended by: Cleda Mccreedy on: 07/09/2023 03:18 PM   Modules accepted: Orders

## 2023-07-09 NOTE — Telephone Encounter (Signed)
 Pt is scheduled for PPM Gen Change with Dr. Waddell on 08/20/23 at 10:30 am. She will get labs done at [facility] on 2/6 after her appt with Daphne Barrack, NP.   We will give her surgical scrub the day of her appt 2/6.  Pt is aware that we will go over all instructions in detail with her at her appt on 2/6.

## 2023-07-29 ENCOUNTER — Other Ambulatory Visit: Payer: Self-pay

## 2023-07-29 MED ORDER — DILTIAZEM HCL ER COATED BEADS 120 MG PO CP24: 120.0000 mg | ORAL_CAPSULE | Freq: Two times a day (BID) | ORAL | 2 refills | Status: DC

## 2023-08-07 ENCOUNTER — Ambulatory Visit: Payer: Medicare Other | Admitting: Pulmonary Disease

## 2023-08-07 ENCOUNTER — Encounter: Payer: Self-pay | Admitting: *Deleted

## 2023-08-07 ENCOUNTER — Ambulatory Visit: Payer: Medicare Other | Attending: Physician Assistant | Admitting: Physician Assistant

## 2023-08-07 ENCOUNTER — Encounter: Payer: Self-pay | Admitting: Physician Assistant

## 2023-08-07 VITALS — BP 164/72 | HR 65 | Ht 64.5 in | Wt 112.2 lb

## 2023-08-07 DIAGNOSIS — I4819 Other persistent atrial fibrillation: Secondary | ICD-10-CM | POA: Diagnosis not present

## 2023-08-07 DIAGNOSIS — Z79899 Other long term (current) drug therapy: Secondary | ICD-10-CM | POA: Diagnosis not present

## 2023-08-07 DIAGNOSIS — Z950 Presence of cardiac pacemaker: Secondary | ICD-10-CM

## 2023-08-07 DIAGNOSIS — Z45010 Encounter for checking and testing of cardiac pacemaker pulse generator [battery]: Secondary | ICD-10-CM

## 2023-08-07 DIAGNOSIS — I10 Essential (primary) hypertension: Secondary | ICD-10-CM

## 2023-08-07 DIAGNOSIS — I441 Atrioventricular block, second degree: Secondary | ICD-10-CM | POA: Diagnosis not present

## 2023-08-07 LAB — CUP PACEART INCLINIC DEVICE CHECK
Battery Impedance: 3463 Ohm
Battery Voltage: 2.73 V
Brady Statistic RV Percent Paced: 100 %
Date Time Interrogation Session: 20250206161125
Implantable Lead Connection Status: 753985
Implantable Lead Connection Status: 753985
Implantable Lead Implant Date: 20130515
Implantable Lead Implant Date: 20130515
Implantable Lead Location: 753859
Implantable Lead Location: 753860
Implantable Lead Model: 5076
Implantable Lead Model: 5076
Implantable Pulse Generator Implant Date: 20130515
Lead Channel Impedance Value: 67 Ohm
Lead Channel Impedance Value: 777 Ohm
Lead Channel Pacing Threshold Amplitude: 1.25 V
Lead Channel Pacing Threshold Pulse Width: 0.4 ms
Lead Channel Setting Pacing Amplitude: 2.5 V
Lead Channel Setting Pacing Pulse Width: 0.4 ms
Lead Channel Setting Sensing Sensitivity: 5.6 mV
Zone Setting Status: 755011
Zone Setting Status: 755011

## 2023-08-07 MED ORDER — DILTIAZEM HCL ER COATED BEADS 120 MG PO CP24: 120.0000 mg | ORAL_CAPSULE | Freq: Two times a day (BID) | ORAL | 2 refills | Status: DC

## 2023-08-07 NOTE — Patient Instructions (Addendum)
 Medication Instructions:    ONLY TAKE AS INSTRUCTED UP TO PROCEDURE 08-20-23 : LASIX   60 MG IN THE AM  LASIX  40 MG IN PM  ( 20 MG TABLETS)   THE RESUME TAKING AS PRESCRIBED  :  LASIX  80 MG  ONCE A DAY ( 20 MG TABLETS)    *If you need a refill on your cardiac medications before your next appointment, please call your pharmacy*   Lab Work:   PLEASE GO DOWN STAIRS  [facility]  FIRST FLOOR  [address] ( GET OFF ELEVATORS MAKE A LEFT AND ANOTHER LEFT LAB ON RIGHT DOWN HALLWAY :  BMET AND CBC TODAY     If you have labs (blood work) drawn today and your tests are completely normal, you will receive your results only by: MyChart Message (if you have MyChart) OR A paper copy in the mail If you have any lab test that is abnormal or we need to change your treatment, we will call you to review the results.   Testing/Procedures:  SEE LETTER BELOW DEVICE BATTERY CHANGE  ON 08-20-23     Follow-Up: At [facility], you and your health needs are our priority.  As part of our continuing mission to provide you with exceptional heart care, we have created designated Provider Care Teams.  These Care Teams include your primary Cardiologist (physician) and Advanced Practice Providers (APPs -  Physician Assistants and Nurse Practitioners) who all work together to provide you with the care you need, when you need it.  We recommend signing up for the patient portal called MyChart.  Sign up information is provided on this After Visit Summary.  MyChart is used to connect with patients for Virtual Visits (Telemedicine).  Patients are able to view lab/test results, encounter notes, upcoming appointments, etc.  Non-urgent messages can be sent to your provider as well.   To learn more about what you can do with MyChart, go to [email or web address].    Your next appointment:  AFTER 08-20-23  10-14 DAY WOUND CHECK DEVICE CLINIC    3  MONTHS    Provider:   Danelle Birmingham, MD   Other Instructions       Implantable Device Instructions  SHANIAH BALTES 08/07/2023  You are scheduled for a Permanent transvenous pacemaker (PPM) on Wednesday, February 19 with Dr. Danelle Birmingham.  1. Pre procedure Lab testing:  PLEASE GO DOWN STAIRS  [facility]  FIRST FLOOR  [address] ( GET OFF ELEVATORS MAKE A LEFT AND ANOTHER LEFT LAB ON RIGHT DOWN HALLWAY :  BMET AND CBC TODAY    2. Please arrive at the [facility] (Main Entrance A) at [facility]: [address] [place], [place] [address] at 8:30 AM (This time is 2 hour(s) before your procedure to ensure your preparation).   Free valet parking service is available. You will check in at ADMITTING. The support person will be asked to wait in the waiting room.  It is OK to have someone drop you off and come back when you are ready to be discharged.          Special note: Every effort is made to have your procedure done on time. Please understand that emergencies sometimes delay  scheduled procedures.  3.  No eating or drinking after midnight prior to procedure.     4.  Medication instructions:  On the morning of your procedure hold your Eliquis (Apixaban) for 3 day(s) prior to your procedure. Your last  dose will be Sunday, February 16, PM dose.    You may take the rest of your medications with a sip of water  the morning  of procedure.      5.  The night before your procedure and the morning of your procedure scrub your neck/chest with CHG surgical scrub.  See instruction letter.  6. Plan to go home the same day, you will only stay overnight if medically necessary. 7. You MUST have a responsible adult to drive you home. 8. An adult MUST be with you the first 24 hours after you arrive home. 9. Bring a current list of your medications, and the last time and date medication taken. 10. Bring ID and current insurance cards. 11. Please wear clothes that are easy to get on and off and wear slip-on shoes.    You will follow up with the [address] [facility]  clinic 10-14 days after your procedure.  You will follow up with Dr. Gregg Taylor 91 days after your procedure.  These appointments will be made for you.   * If you have ANY questions after you get home, please call the office at [phone] or send a MyChart message.  FYI: For your safety, and to allow us to monitor your vital signs accurately during the surgery/procedure we request that if you have artificial nails, gel coating, SNS etc. Please have those removed prior to your surgery/procedure. Not having the nail coverings /polish removed may result in cancellation or delay of your surgery/procedure.    [place] - Preparing For Surgery    Before surgery, you can play an important role. Because skin is not sterile, your skin needs to be as free of germs as possible. You can reduce the number of germs on your skin by washing with CHG (chlorahexidine gluconate) Soap before surgery.  CHG is an antiseptic cleaner which kills germs and bonds with the skin to continue killing germs even after washing.  Please do not use if you have an allergy to CHG or antibacterial soaps.  If your skin becomes reddened/irritated stop using the CHG.   Do not shave (including legs and underarms) for at least 48 hours prior to first CHG shower.  It is OK to shave your face.  Please follow these instructions carefully:  1.  Shower the night before surgery and the morning of surgery with CHG.  2.  If you choose to wash your hair, wash your hair first as usual with your normal shampoo.  3.  After you shampoo, rinse your hair and body thoroughly to remove the shampoo.  4.  Use CHG as you would any other liquid soap.  You can apply CHG directly to the skin and wash gently with a clean washcloth. 5.  Apply the CHG Soap to your body ONLY FROM THE NECK DOWN.  Do not use on open wounds or open sores.  Avoid contact with your eyes, ears, mouth and genitals (private parts).  Wash genitals (private parts) with your normal  soap.  6.  Wash thoroughly, paying special attention to the area where your surgery will be performed.  7.  Thoroughly rinse your body with warm water from the neck down.   8.  DO NOT shower/wash with your normal soap after using and rinsing off the CHG soap.  9.  Pat yourself dry with a clean towel.           10 .  Wear clean pajamas.  11.  Place clean sheets on your bed the night of your first shower and do not sleep with pets.  Day of Surgery: Do not apply any deodorants/lotions.  Please wear clean clothes to the hospital/surgery center.       1st Floor: - Lobby - Registration  - Pharmacy  - Lab - Cafe  2nd Floor: - PV Lab - Diagnostic Testing (echo, CT, nuclear med)  3rd Floor: - Vacant  4th Floor: - TCTS (cardiothoracic surgery) - AFib Clinic - Structural Heart Clinic - Vascular Surgery  - Vascular Ultrasound  5th Floor: - [facility] Card[facility] (general and EP) - Clinical Pharmacy for coumadin , hypertension, lipid, weight-loss medications, and med management appointments    Valet parking services will be available as well.

## 2023-08-07 NOTE — Progress Notes (Signed)
 Cardiology Office Note Date:  08/07/2023  Patient ID:  Rhonda [name], Rhonda [name] April 24, 1930, MRN [id number] PCP:  Clarice Nottingham, MD  Electrophysiologist: Dr. Kelsie >>> Dr. Waddell   Chief Complaint:   RRT  History of Present Illness: Rhonda [name] is a 89 y.o. female with history of PAFib, LBBB, Glaucoma, CAD, and PPM, remote CAD (PCI [phone])  She saw Dr. Waddell 03/04/23, doing well, amiodarone  continues (with none on Sunday)  RRT reached 06/23/23  TODAY   She is accompanied by her husband. He broke his hip a few months back and she has been doing all the things including taking care of him! Denies SOB, DOE, but of late her stamina not as good Infrequently feels a brief heaviness in her chest when she first sits down, this is transient and not noted when up and around. No near syncope or syncope. No bleeding or signs of bleeding  She is edematous today She reports noted for about 5 weeks, about 2 weeks ago self increased her lasix  to 80mg  daily (takes all 80mg  once mid-day) She says in the morning her legs are normal As she is up and around  she gets swollen Denies any SOB, not at rest or with exertional No symptoms of PND, orthopnea     AFib Hx Diagnosed 2010 PVI ablation 11/12/2011 AFlutter 2020 Hx of GIB on Pradaxa  AAD hx Tikosyn  2012 stopped 2013 with failure to maintain SR Amiodarone  Nov 2022 is current   Device information MDT dual chamber PPM implanted 11/13/2011   Past Medical History:  Diagnosis Date   Anemia    Bilateral carotid bruits    Coronary disease    s/p PTCA 1990s   GERD (gastroesophageal reflux disease)    takes Protonix  daily   GI bleed    previously with pradaxa   Glaucoma    uses Eye Drops daily   H/O hiatal hernia    Hyperlipidemia    takes Atorvastatin  daily   Hypertension    takes Azor  and Apresoline  daily   LBBB (left bundle branch block)    Pacemaker    MDT Advisa May 2013, Dr. JAYSON   PAF (paroxysmal atrial fibrillation)  (HCC)    PVI ablation May 2013, takes Coumadin  daily   Sore throat    since anesthesia in Feb 2015 AND ALLEGERIES    Past Surgical History:  Procedure Laterality Date   atrial fibrillation ablation  11/12/11   PVI by Dr Kelsie   ATRIAL FIBRILLATION ABLATION N/A 11/12/2011   Procedure: ATRIAL FIBRILLATION ABLATION;  Surgeon: Lynwood Kelsie, MD;  Location: [facility] CATH LAB;  Service: Cardiovascular;  Laterality: N/A;   CARDIOVERSION N/A 05/26/2019   Procedure: CARDIOVERSION;  Surgeon: Raford Riggs, MD;  Location: [facility] ENDOSCOPY;  Service: Cardiovascular;  Laterality: N/A;   CARDIOVERSION N/A 03/14/2021   Procedure: CARDIOVERSION;  Surgeon: Loni Soyla LABOR, MD;  Location: [facility] ENDOSCOPY;  Service: Cardiovascular;  Laterality: N/A;   CATARACT EXTRACTION W/ INTRAOCULAR LENS  IMPLANT, BILATERAL     COLONOSCOPY     CORONARY ANGIOPLASTY  1990s   by Dr Bulah   ESOPHAGOGASTRECTOMY     EYE SURGERY     GLAUCOMA SURG LT EYE   KYPHOPLASTY N/A 08/09/2013   Procedure: LUMBAR TWO KYPHOPLASTY;  Surgeon: Victory Gens, MD;  Location: [facility] NEURO ORS;  Service: Neurosurgery;  Laterality: N/A;  L2 Kyphoplasty   KYPHOPLASTY N/A 09/07/2013   Procedure: L1 KYPHOPLASTY;  Surgeon: Victory Gens, MD;  Location: [facility] NEURO ORS;  Service: Neurosurgery;  Laterality: N/A;   KYPHOPLASTY N/A 10/04/2013   Procedure: T12 KYPHOPLASTY;  Surgeon: Victory Gens, MD;  Location: [facility] NEURO ORS;  Service: Neurosurgery;  Laterality: N/A;  T12 KYPHOPLASTY   PACEMAKER INSERTION  11/13/11   MDT implanted by Dr Francyne for 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 observed on EP study   PERMANENT PACEMAKER INSERTION N/A 11/13/2011   Procedure: PERMANENT PACEMAKER INSERTION;  Surgeon: Jerel Francyne, MD;  Location: [facility] CATH LAB;  Service: Cardiovascular;  Laterality: N/A;   TEE WITHOUT CARDIOVERSION  11/11/2011   Procedure: TRANSESOPHAGEAL ECHOCARDIOGRAM (TEE);  Surgeon: Vinie KYM Maxcy, MD;  Location: [facility] ENDOSCOPY;  Service: Cardiovascular;   Laterality: N/A;   TEE WITHOUT CARDIOVERSION N/A 05/26/2019   Procedure: TRANSESOPHAGEAL ECHOCARDIOGRAM (TEE);  Surgeon: Raford Riggs, MD;  Location: [facility] ENDOSCOPY;  Service: Cardiovascular;  Laterality: N/A;   TEE WITHOUT CARDIOVERSION N/A 03/14/2021   Procedure: TRANSESOPHAGEAL ECHOCARDIOGRAM (TEE);  Surgeon: Loni Soyla LABOR, MD;  Location: [facility] ENDOSCOPY;  Service: Cardiovascular;  Laterality: N/A;   TEMPORARY PACEMAKER INSERTION  11/12/2011   Procedure: TEMPORARY PACEMAKER INSERTION;  Surgeon: Lynwood Rakers, MD;  Location: [facility] CATH LAB;  Service: Cardiovascular;;    Current Outpatient Medications  Medication Sig Dispense Refill   amiodarone  (PACERONE ) 200 MG tablet TAKE 1 TABLET BY MOUTH ONCE DAILY .  DO  NOT  TAKE  ON  SUNDAYS 90 tablet 0   atorvastatin  (LIPITOR) 40 MG tablet Take 40 mg by mouth every evening.   2   brimonidine  (ALPHAGAN  P) 0.1 % SOLN Place 1 drop into the right eye in the morning, at noon, and at bedtime.     Cholecalciferol (VITAMIN D3) 50 MCG (2000 UT) TABS Take 2,000 Units by mouth daily with lunch.     clobetasol cream (TEMOVATE) 0.05 % Apply 1 application topically as needed.     denosumab  (PROLIA ) 60 MG/ML SOLN injection Inject 60 mg into the skin every 6 (six) months. Administer in upper arm, thigh, or abdomen     diltiazem  (CARDIZEM  CD) 120 MG 24 hr capsule Take 1 capsule (120 mg total) by mouth 2 (two) times daily. 180 capsule 2   diltiazem  (CARDIZEM ) 30 MG tablet Take 1 tablet (30 mg total) by mouth every 4 (four) hours as needed (for heart rate over 100). 45 tablet 0   dorzolamide  (TRUSOPT ) 2 % ophthalmic solution Place 1 drop into the right eye 3 (three) times daily.     ELIQUIS 2.5 MG TABS tablet Take 2.5 mg by mouth 2 (two) times daily.     fluocinonide cream (LIDEX) 0.05 % Apply 1 application. topically 2 (two) times daily.     furosemide  (LASIX ) 20 MG tablet Take 60 mg by mouth daily.     ketotifen (ZADITOR) 0.025 % ophthalmic solution Place 1 drop into  the left eye 2 (two) times daily.     Latanoprostene Bunod (VYZULTA) 0.024 % SOLN Place 1 drop into the right eye at bedtime.      lidocaine  (LMX) 4 % cream Apply 1 application topically daily.     nitroGLYCERIN (NITROSTAT) 0.4 MG SL tablet Place 0.4 mg under the tongue every 5 (five) minutes as needed. For chest pain.     olmesartan  (BENICAR ) 20 MG tablet Take 10 mg by mouth daily as needed (sbp over 140).     No current facility-administered medications for this visit.    Allergies:   Azor  [amlodipine -olmesartan ], Beta adrenergic blockers, Dabigatran etexilate mesylate, Norvasc  [amlodipine  besylate], Norvasc  [amlodipine ], Parathyroid  hormone (recomb), Pradaxa [dabigatran  etexilate mesylate], Questran [cholestyramine], Sulfa drugs cross reactors, and Sulfacetamide sod-sulfur wash [sulfacet-sulfur wash &cleanser]   Social History:  The patient  reports that she has never smoked. She has never used smokeless tobacco. She reports that she does not drink alcohol  and does not use drugs.   Family History:  The patient's family history includes Cancer in her maternal grandmother; Heart disease in her brother, brother, brother, father, maternal grandfather, and mother; Pneumonia in her paternal grandfather, paternal grandmother, and sister.  ROS:  Please see the history of present illness.   All other systems are reviewed and otherwise negative.   PHYSICAL EXAM:  VS:  There were no vitals taken for this visit. BMI: There is no height or weight on file to calculate BMI. Thin WF, in no acute distress HEENT: normocephalic, atraumatic Neck: no JVD, carotid bruits or masses Cardiac:  RRR; 2+/SM, no rubs, or gallops Lungs: CTA b/l, no wheezing, rhonchi or rales Abd: soft, nontender MS: no deformity, advanced atrophy Ext: 2+ edema to just below the knees b/l Skin: warm and dry, no rash Neuro:  No gross deficits appreciated Psych: euthymic mood, full affect  PPM site is stable, no tethering or  discomfort, no erosion/thinning, skin is intact   EKG:  done today and reviewed by myself Asynchronous V paced 65bpm   Pacer interrogation done today and reviewed by myself Battery reached RRT on 06/23/23 RV  lead measurements are ok She is dependent   09/24/21: TTE 1. Left ventricular ejection fraction, by estimation, is 60 to 65%. The  left ventricle has normal function. The left ventricle has no regional  wall motion abnormalities. There is moderate concentric left ventricular  hypertrophy. Left ventricular  diastolic parameters are consistent with Grade II diastolic dysfunction  (pseudonormalization).   2. Right ventricular systolic function is normal. The right ventricular  size is mildly enlarged. There is mildly elevated pulmonary artery  systolic pressure. The estimated right ventricular systolic pressure is  38.7 mmHg.   3. Left atrial size was moderately dilated.   4. The mitral valve is abnormal. Mild mitral valve regurgitation.  Moderate mitral annular calcification.   5. The aortic valve is tricuspid. There is severe calcifcation of the  aortic valve. There is severe thickening of the aortic valve. Aortic valve  regurgitation is mild. Moderate aortic valve stenosis. Aortic valve mean  gradient measures 19.5 mmHg. Aortic   valve Vmax measures 2.92 m/s. AVA by continuity is 0.9cm2, DI 0.26.   6. The inferior vena cava is normal in size with <50% respiratory  variability, suggesting right atrial pressure of 8 mmHg.    03/14/21: TEE/DCCV IMPRESSIONS   1. Very limited images obtained due to patient's respiratory difficulty.  Focused exam performed.   2. Left ventricular ejection fraction, by estimation, is 30 to 35%. The  left ventricle has moderately decreased function.   3. Left atrial size was mild to moderately dilated. No left atrial/left  atrial appendage thrombus was detected.   4. A small pericardial effusion is present.   5. The mitral valve is grossly  normal. Trivial mitral valve  regurgitation.   6. Aortic valve regurgitation is not visualized.   Conclusion(s)/Recommendation(s): No LA/LAA thrombus identified. Successful  cardioversion performed with restoration of normal sinus rhythm.    06/16/13 Echocardiogram: Study Conclusions - Left ventricle: The cavity size was normal. There was mild   concentric hypertrophy. Systolic function was normal. The   estimated ejection fraction was in the range of 55% to  60%. Wall motion was normal; there were no regional wall   motion abnormalities. Doppler parameters are consistent   with abnormal left ventricular relaxation (grade 1   diastolic dysfunction). - Tricuspid valve: Mild regurgitation. - Pulmonary arteries: Systolic pressure was within the   normal range.   Recent Labs:  Wt Readings from Last 3 Encounters:  03/04/23 102 lb (46.3 kg)  03/20/22 106 lb 6.4 oz (48.3 kg)  09/17/21 107 lb 9.6 oz (48.8 kg)     Other studies reviewed: Additional studies/records reviewed today include: summarized above   ASSESSMENT AND PLAN:  1. Persistent AF CHADS2Vasc is at least 5, on Eliquis, appropriately dosed chronic amio Labs today  unknown % burden, RRT status with no histogram data    2. HTN Recheck is 148/80 (this is more bout her home readings) New to the [facility], reports recently found to have higher BPs via PMD  Is being managed with her PMD   3. PPM RRT We discussed generator change procedure, potential risks/benefits What to expect day of her procedure She is agreeable  4. CAD Last/only intervention 1990's No anginal sounding symptoms on statin tx, no ASA with a/c she reports her lipids/labs are done routinely with her PMD   5. CM Suspect 2/2 RVR Recovered LVEF  6.  VHD Mod AS   She is edematous today Lungs are clear Suspect 2/2 RRT/asynchronous pacing Timeline lines up with RRT  Unfortunately unable to revert to DDD programming She is not SOB and  reports tat edema resolves overnight She has been taking 80mg  daily (usually 60) of her lasix  for a couple weeks I have advised she take 60mg  in the AM and 40mg  mid-day  Labs today  Would expect her edema to resolve once she has synchronous pacing restored She is scheduled for 08/20/23 Hold Eliquis 2 days ahead of her procedure and the day of, she understands the instructions, knows Dr. Waddell will advise when to resume post procedure   Disposition: usual post procedure follow up  Current medicines are reviewed at length with the patient today.  The patient did not have any concerns regarding medicines.  Bonney Charlies Bard, PA-C 08/07/2023 7:11 AM     [facility] [address] [place] [address] [phone] (office)  [phone] (fax)

## 2023-08-08 LAB — COMPREHENSIVE METABOLIC PANEL
ALT: 32 [IU]/L (ref 0–32)
AST: 27 [IU]/L (ref 0–40)
Albumin: 4.1 g/dL (ref 3.6–4.6)
Alkaline Phosphatase: 108 [IU]/L (ref 44–121)
BUN/Creatinine Ratio: 21 (ref 12–28)
BUN: 17 mg/dL (ref 10–36)
Bilirubin Total: 0.6 mg/dL (ref 0.0–1.2)
CO2: 22 mmol/L (ref 20–29)
Calcium: 10.1 mg/dL (ref 8.7–10.3)
Chloride: 102 mmol/L (ref 96–106)
Creatinine, Ser: 0.82 mg/dL (ref 0.57–1.00)
Globulin, Total: 3.2 g/dL (ref 1.5–4.5)
Glucose: 81 mg/dL (ref 70–99)
Potassium: 4 mmol/L (ref 3.5–5.2)
Sodium: 142 mmol/L (ref 134–144)
Total Protein: 7.3 g/dL (ref 6.0–8.5)
eGFR: 67 mL/min/{1.73_m2} (ref >59)

## 2023-08-08 LAB — CBC
Hematocrit: 37.4 % (ref 34.0–46.6)
Hemoglobin: 12.7 g/dL (ref 11.1–15.9)
MCH: 31.7 pg (ref 26.6–33.0)
MCHC: 34 g/dL (ref 31.5–35.7)
MCV: 93 fL (ref 79–97)
Platelets: 232 10*3/uL (ref 150–450)
RBC: 4.01 x10E6/uL (ref 3.77–5.28)
RDW: 12.2 % (ref 11.7–15.4)
WBC: 6.5 10*3/uL (ref 3.4–10.8)

## 2023-08-08 LAB — TSH: TSH: 4.8 u[IU]/mL — ABNORMAL HIGH (ref 0.450–4.500)

## 2023-08-12 ENCOUNTER — Telehealth: Payer: Self-pay | Admitting: *Deleted

## 2023-08-12 NOTE — Telephone Encounter (Signed)
Attempted to talk to Husband about labs but was unable to finish due to call dropping.Called back multiple times on home phone no answer. Also called  cell. Cell number voice mail full can not leave a message.

## 2023-08-13 NOTE — Telephone Encounter (Signed)
Patient is returning call to discuss labs.

## 2023-08-14 ENCOUNTER — Other Ambulatory Visit: Payer: Self-pay | Admitting: *Deleted

## 2023-08-14 MED ORDER — POTASSIUM CHLORIDE ER 10 MEQ PO TBCR: 10.0000 meq | EXTENDED_RELEASE_TABLET | Freq: Every day | ORAL | 3 refills | Status: DC

## 2023-08-14 NOTE — Progress Notes (Signed)
Remote pacemaker transmission.

## 2023-08-15 ENCOUNTER — Telehealth: Payer: Self-pay | Admitting: Internal Medicine

## 2023-08-15 ENCOUNTER — Ambulatory Visit: Payer: Medicare Other | Admitting: Pulmonary Disease

## 2023-08-15 NOTE — Telephone Encounter (Signed)
Patient wants a call back to discuss medication instructions prior to upcoming procedure.

## 2023-08-15 NOTE — Telephone Encounter (Signed)
Pt called to verify she was to stop taking her eliquis this weekend. Pt stated understanding

## 2023-08-19 NOTE — Pre-Procedure Instructions (Signed)
 Instructed patient on the following items: Arrival time 0830 Nothing to eat or drink after midnight No meds AM of procedure Responsible person to drive you home and stay with you for 24 hrs Wash with special soap night before and morning of procedure If on anti-coagulant drug instructions Eliquis- last dose Sunday 2/16

## 2023-08-20 ENCOUNTER — Encounter (HOSPITAL_COMMUNITY)
Admission: RE | Disposition: A | Discharge status: Home / Self Care | Payer: Medicare Other | Source: Home / Self Care | Attending: Internal Medicine

## 2023-08-20 ENCOUNTER — Ambulatory Visit (HOSPITAL_COMMUNITY)
Admission: RE | Admit: 2023-08-20 | Discharge: 2023-08-20 | Disposition: A | Discharge status: Home / Self Care | Payer: Medicare Other | Attending: Internal Medicine | Admitting: Internal Medicine

## 2023-08-20 ENCOUNTER — Encounter (HOSPITAL_COMMUNITY): Payer: Self-pay | Admitting: Internal Medicine

## 2023-08-20 ENCOUNTER — Other Ambulatory Visit: Payer: Self-pay

## 2023-08-20 DIAGNOSIS — I10 Essential (primary) hypertension: Secondary | ICD-10-CM | POA: Diagnosis not present

## 2023-08-20 DIAGNOSIS — Z45010 Encounter for checking and testing of cardiac pacemaker pulse generator [battery]: Secondary | ICD-10-CM | POA: Insufficient documentation

## 2023-08-20 DIAGNOSIS — I442 Atrioventricular block, complete: Secondary | ICD-10-CM | POA: Diagnosis not present

## 2023-08-20 DIAGNOSIS — I480 Paroxysmal atrial fibrillation: Secondary | ICD-10-CM | POA: Insufficient documentation

## 2023-08-20 DIAGNOSIS — Z79899 Other long term (current) drug therapy: Secondary | ICD-10-CM | POA: Insufficient documentation

## 2023-08-20 HISTORY — PX: PPM GENERATOR CHANGEOUT: EP1233

## 2023-08-20 LAB — BASIC METABOLIC PANEL
Anion gap: 12 (ref 5–15)
BUN: 16 mg/dL (ref 8–23)
CO2: 26 mmol/L (ref 22–32)
Calcium: 9.8 mg/dL (ref 8.9–10.3)
Chloride: 103 mmol/L (ref 98–111)
Creatinine, Ser: 0.8 mg/dL (ref 0.44–1.00)
GFR, Estimated: >60 mL/min (ref >60)
Glucose, Bld: 92 mg/dL (ref 70–99)
Potassium: 3.5 mmol/L (ref 3.5–5.1)
Sodium: 141 mmol/L (ref 135–145)

## 2023-08-20 SURGERY — PPM GENERATOR CHANGEOUT

## 2023-08-20 MED ORDER — CEFAZOLIN SODIUM-DEXTROSE 2-4 GM/100ML-% IV SOLN
2.0000 g | INTRAVENOUS | Status: AC
  Administered 2023-08-20: 2 g via INTRAVENOUS

## 2023-08-20 MED ORDER — LIDOCAINE HCL (PF) 1 % IJ SOLN
INTRAMUSCULAR | Status: DC | PRN
  Administered 2023-08-20: 45 mL

## 2023-08-20 MED ORDER — SODIUM CHLORIDE 0.9 % IV SOLN
INTRAVENOUS | Status: AC
  Filled 2023-08-20: qty 2

## 2023-08-20 MED ORDER — POVIDONE-IODINE 10 % EX SWAB
2.0000 | Freq: Once | CUTANEOUS | Status: AC
  Administered 2023-08-20: 2 via TOPICAL

## 2023-08-20 MED ORDER — FENTANYL CITRATE (PF) 100 MCG/2ML IJ SOLN
INTRAMUSCULAR | Status: DC | PRN
  Administered 2023-08-20: 12.5 ug via INTRAVENOUS

## 2023-08-20 MED ORDER — MIDAZOLAM HCL 5 MG/5ML IJ SOLN
INTRAMUSCULAR | Status: DC | PRN
  Administered 2023-08-20: 1 mg via INTRAVENOUS

## 2023-08-20 MED ORDER — FENTANYL CITRATE (PF) 100 MCG/2ML IJ SOLN
INTRAMUSCULAR | Status: AC
  Filled 2023-08-20: qty 2

## 2023-08-20 MED ORDER — ONDANSETRON HCL 4 MG/2ML IJ SOLN: 4.0000 mg | Freq: Four times a day (QID) | INTRAMUSCULAR | Status: DC | PRN

## 2023-08-20 MED ORDER — SODIUM CHLORIDE 0.9 % IV SOLN: 80.0000 mg | INTRAVENOUS | Status: DC

## 2023-08-20 MED ORDER — ACETAMINOPHEN 325 MG PO TABS: 325.0000 mg | ORAL_TABLET | ORAL | Status: DC | PRN

## 2023-08-20 MED ORDER — SODIUM CHLORIDE 0.9 % IV SOLN: INTRAVENOUS | Status: DC

## 2023-08-20 MED ORDER — MIDAZOLAM HCL 2 MG/2ML IJ SOLN
INTRAMUSCULAR | Status: AC
  Filled 2023-08-20: qty 2

## 2023-08-20 MED ORDER — LIDOCAINE HCL (PF) 1 % IJ SOLN
INTRAMUSCULAR | Status: AC
  Filled 2023-08-20: qty 60

## 2023-08-20 MED ORDER — CHLORHEXIDINE GLUCONATE 4 % EX SOLN
4.0000 | Freq: Once | CUTANEOUS | Status: DC
  Filled 2023-08-20: qty 60

## 2023-08-20 MED ORDER — CEFAZOLIN SODIUM-DEXTROSE 2-4 GM/100ML-% IV SOLN
INTRAVENOUS | Status: AC
  Filled 2023-08-20: qty 100

## 2023-08-20 MED ORDER — SODIUM CHLORIDE 0.9 % IV SOLN
INTRAVENOUS | Status: DC | PRN
  Administered 2023-08-20: 80 mg

## 2023-08-20 SURGICAL SUPPLY — 7 items
CABLE SURGICAL S-101-97-12 (CABLE) ×1 IMPLANT
IPG PACE AZUR XT DR MRI W1DR01 (Pacemaker) IMPLANT
PACE AZURE XT DR MRI W1DR01 (Pacemaker) ×1 IMPLANT
PAD DEFIB RADIO PHYSIO CONN (PAD) ×1 IMPLANT
POUCH AIGIS-R ANTIBACT PPM (Mesh General) ×1 IMPLANT
POUCH AIGIS-R ANTIBACT PPM MED (Mesh General) IMPLANT
TRAY PACEMAKER INSERTION (PACKS) ×1 IMPLANT

## 2023-08-20 NOTE — H&P (Signed)
 HPI Mrs. Wambolt returns today for followup. She is a pleasant 89 yo woman with CHB, s/p PPM insertion, PAF who has maintained NSR since starting amiodarone about 6 months ago. She has some mild symptoms of dysequilibrium. She denies palpitations.  Allergies       Allergies  Allergen Reactions   Azor [Amlodipine-Olmesartan] Other (See Comments)      Higher doses cause ankles to swell   Beta Adrenergic Blockers Other (See Comments)      Bradycardia.   Dabigatran Etexilate Mesylate Other (See Comments)   Norvasc [Amlodipine Besylate] Other (See Comments)      edema   Norvasc [Amlodipine] Other (See Comments)      Edema, lowered heart rate   Parathyroid Hormone (Recomb) Nausea And Vomiting      Other reaction(s): nausea   Pradaxa [Dabigatran Etexilate Mesylate] Other (See Comments)      GI Bleed   Questran [Cholestyramine] Other (See Comments)      Constipation   Sulfa Drugs Cross Reactors Hives   Sulfacetamide Sod-Sulfur Wash [Sulfacet-Sulfur Wash &Cleanser] Other (See Comments)                Current Outpatient Medications  Medication Sig Dispense Refill   amiodarone (PACERONE) 200 MG tablet Take one tablet ( 200 mg ) by mouth daily.  Do NOT take any Amiodarone on Sundays. 90 tablet 3   atorvastatin (LIPITOR) 40 MG tablet Take 40 mg by mouth every evening.    2   brimonidine (ALPHAGAN P) 0.1 % SOLN Place 1 drop into the right eye in the morning, at noon, and at bedtime.       Cholecalciferol (VITAMIN D3) 50 MCG (2000 UT) TABS Take 2,000 Units by mouth daily with lunch.       clobetasol cream (TEMOVATE) 0.05 % Apply 1 application topically as needed.       denosumab (PROLIA) 60 MG/ML SOLN injection Inject 60 mg into the skin every 6 (six) months. Administer in upper arm, thigh, or abdomen       diltiazem (CARDIZEM CD) 120 MG 24 hr capsule Take 1 capsule (120 mg total) by mouth 2 (two) times daily. 180 capsule 1   diltiazem (CARDIZEM) 30 MG tablet Take 1 tablet (30 mg  total) by mouth every 4 (four) hours as needed (for heart rate over 100). 45 tablet 0   dorzolamide (TRUSOPT) 2 % ophthalmic solution Place 1 drop into the right eye 3 (three) times daily.       ELIQUIS 2.5 MG TABS tablet Take 2.5 mg by mouth 2 (two) times daily.       fluocinonide cream (LIDEX) 0.05 % Apply 1 application. topically 2 (two) times daily.       furosemide (LASIX) 20 MG tablet Take 60 mg by mouth daily.       ketotifen (ZADITOR) 0.025 % ophthalmic solution Place 1 drop into the left eye 2 (two) times daily.       Latanoprostene Bunod (VYZULTA) 0.024 % SOLN Place 1 drop into the right eye at bedtime.        lidocaine (LMX) 4 % cream Apply 1 application topically daily.       nitroGLYCERIN (NITROSTAT) 0.4 MG SL tablet Place 0.4 mg under the tongue every 5 (five) minutes as needed. For chest pain.       olmesartan (BENICAR) 20 MG tablet Take 10 mg by mouth daily as needed (sbp over 140).  No current facility-administered medications for this visit.              Past Medical History:  Diagnosis Date   Anemia     Bilateral carotid bruits     Coronary disease      s/p PTCA 1990s   GERD (gastroesophageal reflux disease)      takes Protonix daily   GI bleed      previously with pradaxa   Glaucoma      uses Eye Drops daily   H/O hiatal hernia     Hyperlipidemia      takes Atorvastatin daily   Hypertension      takes Azor and Apresoline daily   LBBB (left bundle branch block)     Pacemaker      MDT Advisa May 2013, Dr. Salena Saner   PAF (paroxysmal atrial fibrillation) (HCC)      PVI ablation May 2013, takes Coumadin daily   Sore throat      since anesthesia in Feb 2015 AND ALLEGERIES          ROS:    All systems reviewed and negative except as noted in the HPI.          Past Surgical History:  Procedure Laterality Date   atrial fibrillation ablation   11/12/11    PVI by Dr Johney Frame   ATRIAL FIBRILLATION ABLATION N/A 11/12/2011    Procedure: ATRIAL FIBRILLATION  ABLATION;  Surgeon: [name], MD;  Location: [facility] CATH LAB;  Service: Cardiovascular;  Laterality: N/A;   CARDIOVERSION N/A 05/26/2019    Procedure: CARDIOVERSION;  Surgeon: Chilton Si, MD;  Location: [facility] ENDOSCOPY;  Service: Cardiovascular;  Laterality: N/A;   CARDIOVERSION N/A 03/14/2021    Procedure: CARDIOVERSION;  Surgeon: Parke Poisson, MD;  Location: [facility] ENDOSCOPY;  Service: Cardiovascular;  Laterality: N/A;   CATARACT EXTRACTION W/ INTRAOCULAR LENS  IMPLANT, BILATERAL       COLONOSCOPY       CORONARY ANGIOPLASTY   1990s    by Dr Aleen Campi   ESOPHAGOGASTRECTOMY       EYE SURGERY        GLAUCOMA SURG LT EYE   KYPHOPLASTY N/A 08/09/2013    Procedure: LUMBAR TWO KYPHOPLASTY;  Surgeon: Barnett Abu, MD;  Location: [facility] NEURO ORS;  Service: Neurosurgery;  Laterality: N/A;  L2 Kyphoplasty   KYPHOPLASTY N/A 09/07/2013    Procedure: L1 KYPHOPLASTY;  Surgeon: Barnett Abu, MD;  Location: [facility] NEURO ORS;  Service: Neurosurgery;  Laterality: N/A;   KYPHOPLASTY N/A 10/04/2013    Procedure: T12 KYPHOPLASTY;  Surgeon: Barnett Abu, MD;  Location: [facility] NEURO ORS;  Service: Neurosurgery;  Laterality: N/A;  T12 KYPHOPLASTY   PACEMAKER INSERTION   11/13/11    MDT implanted by Dr Royann Shivers for 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 observed on EP study   PERMANENT PACEMAKER INSERTION N/A 11/13/2011    Procedure: PERMANENT PACEMAKER INSERTION;  Surgeon: Thurmon Fair, MD;  Location: [facility] CATH LAB;  Service: Cardiovascular;  Laterality: N/A;   TEE WITHOUT CARDIOVERSION   11/11/2011    Procedure: TRANSESOPHAGEAL ECHOCARDIOGRAM (TEE);  Surgeon: Chrystie Nose, MD;  Location: [facility] ENDOSCOPY;  Service: Cardiovascular;  Laterality: N/A;   TEE WITHOUT CARDIOVERSION N/A 05/26/2019    Procedure: TRANSESOPHAGEAL ECHOCARDIOGRAM (TEE);  Surgeon: Chilton Si, MD;  Location: [facility] ENDOSCOPY;  Service: Cardiovascular;  Laterality: N/A;   TEE WITHOUT CARDIOVERSION N/A 03/14/2021    Procedure: TRANSESOPHAGEAL  ECHOCARDIOGRAM (TEE);  Surgeon: Parke Poisson, MD;  Location: [facility] ENDOSCOPY;  Service: Cardiovascular;  Laterality: N/A;   TEMPORARY PACEMAKER INSERTION   11/12/2011    Procedure: TEMPORARY PACEMAKER INSERTION;  Surgeon: [name], MD;  Location: [facility] CATH LAB;  Service: Cardiovascular;;                 Family History  Problem Relation Age of Onset   Heart disease Mother     Heart disease Father     Heart disease Brother     Cancer Maternal Grandmother     Heart disease Maternal Grandfather     Pneumonia Paternal Grandmother     Pneumonia Paternal Grandfather     Heart disease Brother     Heart disease Brother     Pneumonia Sister              Social History         Socioeconomic History   Marital status: Married      Spouse name: Not on file   Number of children: Not on file   Years of education: Not on file   Highest education level: Not on file  Occupational History   Not on file  Tobacco Use   Smoking status: Never   Smokeless tobacco: Never  Vaping Use   Vaping status: Never Used  Substance and Sexual Activity   Alcohol use: No   Drug use: No   Sexual activity: Not Currently      Birth control/protection: Post-menopausal  Other Topics Concern   Not on file  Social History Narrative    Pt lives in [place].  Retired form [employer].    Attends [employer]    Social Determinants of Health    Financial Resource Strain: Not on file  Food Insecurity: Not on file  Transportation Needs: Not on file  Physical Activity: Not on file  Stress: Not on file  Social Connections: Not on file  Intimate Partner Violence: Not on file        BP (!) 170/86   Pulse 75   Ht 5\' 4"  (1.626 m)   Wt 102 lb (46.3 kg)   SpO2 96%   BMI 17.51 kg/m    Physical Exam:   Well appearing NAD HEENT: Unremarkable Neck:  No JVD, no thyromegally Lymphatics:  No adenopathy Back:  No CVA tenderness Lungs:  Clear HEART:  Regular rate rhythm, no murmurs, no rubs,  no clicks Abd:  soft, positive bowel sounds, no organomegally, no rebound, no guarding Ext:  2 plus pulses, no edema, no cyanosis, no clubbing Skin:  No rashes no nodules Neuro:  CN II through XII intact, motor grossly intact   EKG   DEVICE  Normal device function.  See PaceArt for details.    Assess/Plan:   CHB - she is asymptomatic s/p PPM insertion. PPM - her medtronic DDD PM has about 2 years of longevity. We will follow. HTN - her bp is high but she brings in her bp checks and sbp is all below 120.  PAF - she is maintaining NSR on amiodarone. I asked her to continue her dose 200 mg daily, none on Sunday.    Dorathy Daft  EP Attending  Patient seen and examined. Since her last visit, she has reached ERI and will undergo PPM gen change out. I have reviewed the indications/risks/benefits/goals/expectations and she wishes to proceed.  Sharlot Gowda Brindy Higginbotham,MD

## 2023-08-20 NOTE — Discharge Instructions (Signed)

## 2023-08-21 ENCOUNTER — Telehealth: Payer: Self-pay | Admitting: Internal Medicine

## 2023-08-21 ENCOUNTER — Telehealth: Payer: Self-pay | Admitting: Pulmonary Disease

## 2023-08-21 ENCOUNTER — Encounter: Payer: Self-pay | Admitting: Internal Medicine

## 2023-08-21 NOTE — Telephone Encounter (Signed)
 Error

## 2023-08-21 NOTE — Telephone Encounter (Signed)
 Spoke to pt advising her remote transmission was received and she did not have to send daily. Pt was appreciative of call.

## 2023-08-21 NOTE — Telephone Encounter (Signed)
 Called patient to check in on her after her procedure.  She reports she is a "little bit sore" but doing well otherwise.  She has gone back to her 80 mg lasix daily dosing and her swelling is improved.  Questions answered about arm restrictions, movement and wound care.     Canary Brim, NP-C, AGACNP-BC [facility] - Electrophysiology  08/21/2023, 1:44 PM

## 2023-08-21 NOTE — Telephone Encounter (Signed)
 Patient is requesting call back to check on her new device and to make sure that it is working well. Please advise.

## 2023-08-24 MED FILL — Midazolam HCl Inj 2 MG/2ML (Base Equivalent): INTRAMUSCULAR | Qty: 1 | Status: AC

## 2023-08-28 ENCOUNTER — Other Ambulatory Visit: Payer: Self-pay

## 2023-08-28 MED ORDER — AMIODARONE HCL 200 MG PO TABS: ORAL_TABLET | ORAL | 3 refills | Status: AC

## 2023-09-04 ENCOUNTER — Ambulatory Visit: Payer: Medicare Other

## 2023-09-11 ENCOUNTER — Ambulatory Visit

## 2023-09-11 ENCOUNTER — Ambulatory Visit: Attending: Cardiovascular Disease

## 2023-09-11 DIAGNOSIS — I442 Atrioventricular block, complete: Secondary | ICD-10-CM

## 2023-09-11 LAB — CUP PACEART INCLINIC DEVICE CHECK
Date Time Interrogation Session: 20250313203835
Implantable Lead Connection Status: 753985
Implantable Lead Connection Status: 753985
Implantable Lead Implant Date: 20130515
Implantable Lead Implant Date: 20130515
Implantable Lead Location: 753859
Implantable Lead Location: 753860
Implantable Lead Model: 5076
Implantable Lead Model: 5076
Implantable Pulse Generator Implant Date: 20250219

## 2023-09-11 NOTE — Patient Instructions (Signed)

## 2023-09-11 NOTE — Progress Notes (Signed)
 Normal dual chamber pacemaker wound check. Presenting rhythm: AF/VP 60 . There was 1 small stitch noted at incision line.  Stitch clipped/removed, otherwise wound well healed, incision edges well approximated, no s/s of infection.  Small superficial open area at distal end of incision line where stitch removed.  Patient is to [facility] and cleanse the area twice daily using Dial soap/water and clean cloth to wash/dry.  Will have patient return to clinic in 1 week to ensure area healing appropriately.   Routine testing performed. Thresholds, sensing, and impedances consistent with implant measurements programmed safety margins appropriate for chronic leads. AT/AF burden 37.1%.  62 AT/AF episodes all appear AF  longest 23 hours, patient is on OAC. No elevated ventricular rates - patient PPM dependent (CHB).. No arm restrictions.  Pt enrolled in remote follow-up.  PROGRAMMING CHANGE:  RV threshold adjusted to Adaptive 3.0V@.4ms from Adaptive 2.5v@ 0.76ms.

## 2023-09-17 ENCOUNTER — Ambulatory Visit: Attending: Internal Medicine

## 2023-09-17 DIAGNOSIS — I442 Atrioventricular block, complete: Secondary | ICD-10-CM

## 2023-09-17 NOTE — Patient Instructions (Signed)
 Follow up as scheduled.

## 2023-09-17 NOTE — Progress Notes (Signed)
 Pt seen in device clinic for wound recheck.  Pt s/p pacemaker generator change 08/20/2023.  At her follow up wound check a stitch was noted at her incision site and removed.  She is here today to recheck wound site.    Incision site remains well healed with no s/s of infection.  There is a small scab along the incision where the stitch was removed.  Attempted to gently remove scab without success.  Advised Pt to continue to wash site and monitor for any changes.  Advised scab should fall off.    No follow up needed.  Pt will call if any changes to wound site.

## 2023-10-02 ENCOUNTER — Ambulatory Visit (INDEPENDENT_AMBULATORY_CARE_PROVIDER_SITE_OTHER): Payer: Self-pay

## 2023-10-02 DIAGNOSIS — I442 Atrioventricular block, complete: Secondary | ICD-10-CM | POA: Diagnosis not present

## 2023-10-02 LAB — CUP PACEART REMOTE DEVICE CHECK
Battery Remaining Longevity: 130 mo
Battery Voltage: 3.16 V
Brady Statistic AP VP Percent: 88.75 %
Brady Statistic AP VS Percent: 0 %
Brady Statistic AS VP Percent: 11.21 %
Brady Statistic AS VS Percent: 0.02 %
Brady Statistic RA Percent Paced: 70.33 %
Brady Statistic RV Percent Paced: 99.98 %
Date Time Interrogation Session: 20250402231951
Implantable Lead Connection Status: 753985
Implantable Lead Connection Status: 753985
Implantable Lead Implant Date: 20130515
Implantable Lead Implant Date: 20130515
Implantable Lead Location: 753859
Implantable Lead Location: 753860
Implantable Lead Model: 5076
Implantable Lead Model: 5076
Implantable Pulse Generator Implant Date: 20250219
Lead Channel Impedance Value: 266 Ohm
Lead Channel Impedance Value: 361 Ohm
Lead Channel Impedance Value: 361 Ohm
Lead Channel Impedance Value: 741 Ohm
Lead Channel Pacing Threshold Amplitude: 1.25 V
Lead Channel Pacing Threshold Pulse Width: 0.4 ms
Lead Channel Sensing Intrinsic Amplitude: 1.375 mV
Lead Channel Sensing Intrinsic Amplitude: 1.375 mV
Lead Channel Setting Pacing Amplitude: 2 V
Lead Channel Setting Pacing Amplitude: 2.5 V
Lead Channel Setting Pacing Pulse Width: 0.4 ms
Lead Channel Setting Sensing Sensitivity: 2 mV
Zone Setting Status: 755011

## 2023-10-30 ENCOUNTER — Telehealth: Payer: Self-pay

## 2023-10-30 NOTE — Telephone Encounter (Signed)
 Medication

## 2023-10-30 NOTE — Telephone Encounter (Signed)
 Per review of Pt's pharmacies, she does not have [place] listed as a possible location.  Pt calling in upset that her husband's medications went to a pharmacy in [place].   Advised would remove this pharmacy from their lists of pharmacies.

## 2023-11-10 NOTE — Progress Notes (Signed)
 Remote pacemaker transmission.

## 2023-11-27 ENCOUNTER — Encounter: Payer: Self-pay | Admitting: Internal Medicine

## 2023-11-27 ENCOUNTER — Ambulatory Visit: Payer: Medicare Other | Attending: Internal Medicine | Admitting: Internal Medicine

## 2023-11-27 VITALS — BP 176/90 | HR 79 | Ht 64.5 in | Wt 111.6 lb

## 2023-11-27 DIAGNOSIS — I442 Atrioventricular block, complete: Secondary | ICD-10-CM

## 2023-11-27 DIAGNOSIS — I480 Paroxysmal atrial fibrillation: Secondary | ICD-10-CM | POA: Diagnosis not present

## 2023-11-27 DIAGNOSIS — I10 Essential (primary) hypertension: Secondary | ICD-10-CM | POA: Diagnosis not present

## 2023-11-27 LAB — CUP PACEART INCLINIC DEVICE CHECK
Date Time Interrogation Session: 20250529115712
Implantable Lead Connection Status: 753985
Implantable Lead Connection Status: 753985
Implantable Lead Implant Date: 20130515
Implantable Lead Implant Date: 20130515
Implantable Lead Location: 753859
Implantable Lead Location: 753860
Implantable Lead Model: 5076
Implantable Lead Model: 5076
Implantable Pulse Generator Implant Date: 20250219

## 2023-11-27 NOTE — Progress Notes (Signed)
 HPI Rhonda [name] returns today for followup. She is a pleasant 89 yo woman with CHB, s/p PPM insertion, Persistent AF who had initially maintained NSR but has reverted back to afib. She was initially symptomatic when out of rhythm but now with CHB, she does not know if she is in our out. She denies palpitations. She has some swelling in her left leg after surgery to remove a skin CA.  Allergies  Allergen Reactions   Azor  [Amlodipine -Olmesartan ] Other (See Comments)    Higher doses cause ankles to swell   Beta Adrenergic Blockers Other (See Comments)    Bradycardia.   Norvasc  [Amlodipine ] Other (See Comments)    Edema, lowered heart rate   Parathyroid  Hormone (Recomb) Nausea And Vomiting   Pradaxa [Dabigatran Etexilate Mesylate] Other (See Comments)    GI Bleed   Questran [Cholestyramine] Other (See Comments)    Constipation   Sulfa Drugs Cross Reactors Hives   Sulfacetamide Sod-Sulfur Wash [Sulfacet-Sulfur Wash &Cleanser] Hives     Current Outpatient Medications  Medication Sig Dispense Refill   amiodarone  (PACERONE ) 200 MG tablet TAKE 1 TABLET BY MOUTH ONCE DAILY .  DO  NOT  TAKE  ON  SUNDAYS 90 tablet 3   atorvastatin  (LIPITOR) 40 MG tablet Take 40 mg by mouth every evening.   2   brimonidine  (ALPHAGAN  P) 0.1 % SOLN Place 1 drop into the right eye 3 (three) times daily.     Cholecalciferol (VITAMIN D3) 50 MCG (2000 UT) TABS Take 2,000 Units by mouth daily with lunch.     denosumab  (PROLIA ) 60 MG/ML SOLN injection Inject 60 mg into the skin every 6 (six) months. Administer in upper arm, thigh, or abdomen     diltiazem  (CARDIZEM  CD) 120 MG 24 hr capsule Take 1 capsule (120 mg total) by mouth 2 (two) times daily. 180 capsule 2   diltiazem  (CARDIZEM ) 30 MG tablet Take 1 tablet (30 mg total) by mouth every 4 (four) hours as needed (for heart rate over 100). 45 tablet 0   dorzolamide  (TRUSOPT ) 2 % ophthalmic solution Place 1 drop into the right eye 3 (three) times daily.     ELIQUIS  2.5 MG TABS tablet Take 2.5 mg by mouth 2 (two) times daily.     furosemide  (LASIX ) 20 MG tablet Take 40-60 mg by mouth See admin instructions. Take 60 mg in the morning and 40 mg in the evening     ketotifen (ZADITOR) 0.025 % ophthalmic solution Place 1 drop into the left eye daily.     Latanoprostene Bunod (VYZULTA) 0.024 % SOLN Place 1 drop into the right eye at bedtime.      lidocaine  (LMX) 4 % cream Apply 1 Application topically daily.     nitroGLYCERIN (NITROSTAT) 0.4 MG SL tablet Place 0.4 mg under the tongue every 5 (five) minutes as needed. For chest pain.     telmisartan (MICARDIS) 20 MG tablet Take 20 mg by mouth daily.     potassium chloride  (KLOR-CON ) 10 MEQ tablet Take 1 tablet (10 mEq total) by mouth daily. 90 tablet 3   No current facility-administered medications for this visit.     Past Medical History:  Diagnosis Date   Anemia    Bilateral carotid bruits    Coronary disease    s/p PTCA 1990s   GERD (gastroesophageal reflux disease)    takes Protonix  daily   GI bleed    previously with pradaxa   Glaucoma    uses  Eye Drops daily   H/O hiatal hernia    Hyperlipidemia    takes Atorvastatin  daily   Hypertension    takes Azor  and Apresoline  daily   LBBB (left bundle branch block)    Pacemaker    MDT Advisa May 2013, Dr. Tita Form   PAF (paroxysmal atrial fibrillation) [facility])    PVI ablation May 2013, takes Coumadin  daily   Sore throat    since anesthesia in Feb 2015 AND ALLEGERIES    ROS:   All systems reviewed and negative except as noted in the HPI.   Past Surgical History:  Procedure Laterality Date   atrial fibrillation ablation  11/12/11   PVI by Dr Nunzio Belch   ATRIAL FIBRILLATION ABLATION N/A 11/12/2011   Procedure: ATRIAL FIBRILLATION ABLATION;  Surgeon: Jolly Needle, MD;  Location: [facility] CATH LAB;  Service: Cardiovascular;  Laterality: N/A;   CARDIOVERSION N/A 05/26/2019   Procedure: CARDIOVERSION;  Surgeon: Maudine Sos, MD;  Location: [facility] ENDOSCOPY;   Service: Cardiovascular;  Laterality: N/A;   CARDIOVERSION N/A 03/14/2021   Procedure: CARDIOVERSION;  Surgeon: Euell Herrlich, MD;  Location: [facility] ENDOSCOPY;  Service: Cardiovascular;  Laterality: N/A;   CATARACT EXTRACTION W/ INTRAOCULAR LENS  IMPLANT, BILATERAL     COLONOSCOPY     CORONARY ANGIOPLASTY  1990s   by Dr Azalea Lento   ESOPHAGOGASTRECTOMY     EYE SURGERY     GLAUCOMA SURG LT EYE   KYPHOPLASTY N/A 08/09/2013   Procedure: LUMBAR TWO KYPHOPLASTY;  Surgeon: Elna Haggis, MD;  Location: [facility] NEURO ORS;  Service: Neurosurgery;  Laterality: N/A;  L2 Kyphoplasty   KYPHOPLASTY N/A 09/07/2013   Procedure: L1 KYPHOPLASTY;  Surgeon: Elna Haggis, MD;  Location: [facility] NEURO ORS;  Service: Neurosurgery;  Laterality: N/A;   KYPHOPLASTY N/A 10/04/2013   Procedure: T12 KYPHOPLASTY;  Surgeon: Elna Haggis, MD;  Location: [facility] NEURO ORS;  Service: Neurosurgery;  Laterality: N/A;  T12 KYPHOPLASTY   PACEMAKER INSERTION  11/13/11   MDT implanted by Dr Alvis Ba for 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 observed on EP study   PERMANENT PACEMAKER INSERTION N/A 11/13/2011   Procedure: PERMANENT PACEMAKER INSERTION;  Surgeon: Luana Rumple, MD;  Location: [facility] CATH LAB;  Service: Cardiovascular;  Laterality: N/A;   PPM GENERATOR CHANGEOUT N/A 08/20/2023   Procedure: PPM GENERATOR CHANGEOUT;  Surgeon: Tammie Fall, MD;  Location: [facility] INVASIVE CV LAB;  Service: Cardiovascular;  Laterality: N/A;   TEE WITHOUT CARDIOVERSION  11/11/2011   Procedure: TRANSESOPHAGEAL ECHOCARDIOGRAM (TEE);  Surgeon: Hazle Lites, MD;  Location: [facility] ENDOSCOPY;  Service: Cardiovascular;  Laterality: N/A;   TEE WITHOUT CARDIOVERSION N/A 05/26/2019   Procedure: TRANSESOPHAGEAL ECHOCARDIOGRAM (TEE);  Surgeon: Maudine Sos, MD;  Location: [facility] ENDOSCOPY;  Service: Cardiovascular;  Laterality: N/A;   TEE WITHOUT CARDIOVERSION N/A 03/14/2021   Procedure: TRANSESOPHAGEAL ECHOCARDIOGRAM (TEE);  Surgeon: Euell Herrlich, MD;  Location: [facility]  ENDOSCOPY;  Service: Cardiovascular;  Laterality: N/A;   TEMPORARY PACEMAKER INSERTION  11/12/2011   Procedure: TEMPORARY PACEMAKER INSERTION;  Surgeon: Jolly Needle, MD;  Location: [facility] CATH LAB;  Service: Cardiovascular;;     Family History  Problem Relation Age of Onset   Heart disease Mother    Heart disease Father    Heart disease Brother    Cancer Maternal Grandmother    Heart disease Maternal Grandfather    Pneumonia Paternal Grandmother    Pneumonia Paternal Grandfather    Heart disease Brother    Heart disease Brother    Pneumonia Sister  Social History   Socioeconomic History   Marital status: Married    Spouse name: Not on file   Number of children: Not on file   Years of education: Not on file   Highest education level: Not on file  Occupational History   Not on file  Tobacco Use   Smoking status: Never   Smokeless tobacco: Never  Vaping Use   Vaping status: Never Used  Substance and Sexual Activity   Alcohol  use: No   Drug use: No   Sexual activity: Not Currently    Birth control/protection: Post-menopausal  Other Topics Concern   Not on file  Social History Narrative   Pt lives in [place].  Retired form [employer].   Attends [employer]   Social Drivers of Health   Financial Resource Strain: Not on file  Food Insecurity: Not on file  Transportation Needs: Not on file  Physical Activity: Not on file  Stress: Not on file  Social Connections: Not on file  Intimate Partner Violence: Not on file     BP (!) 176/90   Pulse 79   Ht 5' 4.5" (1.638 m)   Wt 111 lb 9.6 oz (50.6 kg)   SpO2 98%   BMI 18.86 kg/m   Physical Exam:  Well appearing elderly woman, NAD HEENT: Unremarkable Neck:  No JVD, no thyromegally Lymphatics:  No adenopathy Back:  No CVA tenderness Lungs:  Clear with no wheezes HEART:  Regular rate rhythm, no murmurs, no rubs, no clicks Abd:  soft, positive bowel sounds, no organomegally, no rebound, no  guarding Ext:  2 plus pulses, 1+ edema right leg, 2+ left leg, no cyanosis, no clubbing Skin:  No rashes no nodules Neuro:  CN II through XII intact, motor grossly intact  EKG - afib with a controlled VR  DEVICE  Normal device function.  See PaceArt for details.   Assess/Plan:  CHB - she is asymptomatic s/p PPM insertion. PPM - her medtronic DDD PM has about 2 years of longevity. We will follow. HTN - her bp is high but she brings in her bp checks and sbp is all below 120.  Persistent AF - she is back in afib but did not know it. I asked her to stop amio. She will be maintained with rate control.   Pete Brand Avant Printy,MD

## 2023-11-27 NOTE — Patient Instructions (Signed)
 Medication Instructions:  Your physician has recommended you make the following change in your medication:  Stop amiodarone . Wear support socking on left leg.  Lab Work: None ordered.  You may go to any [facility] Location for your lab work:  [facility] - [address] ([facility]) - [address] [phone] [address]  [place] - [address]   [place]  - [address]   [place] - [address] - [address] [employer]  - [address] [place] [address] ([facility]   If you have labs (blood work) drawn today and your tests are completely normal, you will receive your results only by: [employer] (if you have MyChart)  If you have any lab test that is abnormal or we need to change your treatment, we will call you or send a MyChart message to review the results.  Testing/Procedures: None ordered.  Follow-Up: At [facility], you and your health needs are our priority.  As part of our continuing mission to provide you with exceptional heart care, we have created designated Provider Care Teams.  These Care Teams include your primary Cardiologist (physician) and Advanced Practice Providers (APPs -  Physician Assistants and Nurse Practitioners) who all work together to provide you with the care you need, when you need it.  We recommend signing up for the patient portal called "MyChart".  Sign up information is provided on this After Visit Summary.  MyChart is used to connect with patients for Virtual Visits (Telemedicine).  Patients are able to view lab/test results, encounter notes, upcoming appointments, etc.  Non-urgent messages can be sent to your provider as well.   To learn more about what you can do with MyChart, go to [email or web address].    Your next appointment:   1 year(s)  The format for your next appointment:   In Person  Provider:    Manya Sells, MD{or one of the following Advanced Practice Providers on your designated Care Team:   Mertha Abrahams, [place] Bambi Lever "Jonelle Neri" [place], [place] Neda Balk, NP  Note: Remote monitoring is used to monitor your Pacemaker/ ICD from home. This monitoring reduces the number of office visits required to check your device to one time per year. It allows us  to keep an eye on the functioning of your device to ensure it is working properly.

## 2024-01-01 ENCOUNTER — Ambulatory Visit (INDEPENDENT_AMBULATORY_CARE_PROVIDER_SITE_OTHER): Payer: Self-pay

## 2024-01-01 DIAGNOSIS — I442 Atrioventricular block, complete: Secondary | ICD-10-CM | POA: Diagnosis not present

## 2024-01-01 LAB — CUP PACEART REMOTE DEVICE CHECK
Battery Remaining Longevity: 129 mo
Battery Voltage: 3.1 V
Brady Statistic RA Percent Paced: 2.19 %
Brady Statistic RV Percent Paced: 99.94 %
Date Time Interrogation Session: 20250702233512
Implantable Lead Connection Status: 753985
Implantable Lead Connection Status: 753985
Implantable Lead Implant Date: 20130515
Implantable Lead Implant Date: 20130515
Implantable Lead Location: 753859
Implantable Lead Location: 753860
Implantable Lead Model: 5076
Implantable Lead Model: 5076
Implantable Pulse Generator Implant Date: 20250219
Lead Channel Impedance Value: 266 Ohm
Lead Channel Impedance Value: 342 Ohm
Lead Channel Impedance Value: 361 Ohm
Lead Channel Impedance Value: 741 Ohm
Lead Channel Pacing Threshold Amplitude: 1.25 V
Lead Channel Pacing Threshold Pulse Width: 0.4 ms
Lead Channel Sensing Intrinsic Amplitude: 1.125 mV
Lead Channel Sensing Intrinsic Amplitude: 1.125 mV
Lead Channel Setting Pacing Amplitude: 2 V
Lead Channel Setting Pacing Amplitude: 2.5 V
Lead Channel Setting Pacing Pulse Width: 0.4 ms
Lead Channel Setting Sensing Sensitivity: 2 mV
Zone Setting Status: 755011

## 2024-01-07 ENCOUNTER — Ambulatory Visit: Payer: Self-pay | Admitting: Internal Medicine

## 2024-04-01 ENCOUNTER — Ambulatory Visit: Payer: Self-pay

## 2024-04-01 DIAGNOSIS — I442 Atrioventricular block, complete: Secondary | ICD-10-CM | POA: Diagnosis not present

## 2024-04-01 LAB — CUP PACEART REMOTE DEVICE CHECK
Battery Remaining Longevity: 128 mo
Battery Voltage: 3.05 V
Brady Statistic AP VP Percent: 99.6 %
Brady Statistic AP VS Percent: 0 %
Brady Statistic AS VP Percent: 0.39 %
Brady Statistic AS VS Percent: 0.01 %
Brady Statistic RA Percent Paced: 43.03 %
Brady Statistic RV Percent Paced: 99.95 %
Date Time Interrogation Session: 20251002055147
Implantable Lead Connection Status: 753985
Implantable Lead Connection Status: 753985
Implantable Lead Implant Date: 20130515
Implantable Lead Implant Date: 20130515
Implantable Lead Location: 753859
Implantable Lead Location: 753860
Implantable Lead Model: 5076
Implantable Lead Model: 5076
Implantable Pulse Generator Implant Date: 20250219
Lead Channel Impedance Value: 285 Ohm
Lead Channel Impedance Value: 361 Ohm
Lead Channel Impedance Value: 361 Ohm
Lead Channel Impedance Value: 684 Ohm
Lead Channel Pacing Threshold Amplitude: 1.125 V
Lead Channel Pacing Threshold Pulse Width: 0.4 ms
Lead Channel Sensing Intrinsic Amplitude: 3 mV
Lead Channel Sensing Intrinsic Amplitude: 3 mV
Lead Channel Setting Pacing Amplitude: 2 V
Lead Channel Setting Pacing Amplitude: 2.25 V
Lead Channel Setting Pacing Pulse Width: 0.4 ms
Lead Channel Setting Sensing Sensitivity: 2 mV
Zone Setting Status: 755011

## 2024-04-04 ENCOUNTER — Ambulatory Visit: Payer: Self-pay | Admitting: Internal Medicine

## 2024-04-05 NOTE — Progress Notes (Signed)
 Remote PPM Transmission

## 2024-04-09 NOTE — Progress Notes (Signed)
 Remote PPM Transmission

## 2024-05-02 ENCOUNTER — Other Ambulatory Visit: Payer: Self-pay | Admitting: Physician Assistant

## 2024-07-02 ENCOUNTER — Ambulatory Visit: Payer: Self-pay

## 2024-07-02 DIAGNOSIS — I442 Atrioventricular block, complete: Secondary | ICD-10-CM | POA: Diagnosis not present

## 2024-07-04 LAB — CUP PACEART REMOTE DEVICE CHECK
Battery Remaining Longevity: 131 mo
Battery Voltage: 3.03 V
Brady Statistic AP VP Percent: 97.08 %
Brady Statistic AP VS Percent: 0 %
Brady Statistic AS VP Percent: 2.9 %
Brady Statistic AS VS Percent: 0.02 %
Brady Statistic RA Percent Paced: 96.81 %
Brady Statistic RV Percent Paced: 99.98 %
Date Time Interrogation Session: 20260102031900
Implantable Lead Connection Status: 753985
Implantable Lead Connection Status: 753985
Implantable Lead Implant Date: 20130515
Implantable Lead Implant Date: 20130515
Implantable Lead Location: 753859
Implantable Lead Location: 753860
Implantable Lead Model: 5076
Implantable Lead Model: 5076
Implantable Pulse Generator Implant Date: 20250219
Lead Channel Impedance Value: 266 Ohm
Lead Channel Impedance Value: 342 Ohm
Lead Channel Impedance Value: 361 Ohm
Lead Channel Impedance Value: 646 Ohm
Lead Channel Pacing Threshold Amplitude: 0.5 V
Lead Channel Pacing Threshold Amplitude: 1 V
Lead Channel Pacing Threshold Pulse Width: 0.4 ms
Lead Channel Pacing Threshold Pulse Width: 0.4 ms
Lead Channel Sensing Intrinsic Amplitude: 2.125 mV
Lead Channel Sensing Intrinsic Amplitude: 2.125 mV
Lead Channel Setting Pacing Amplitude: 1.5 V
Lead Channel Setting Pacing Amplitude: 2 V
Lead Channel Setting Pacing Pulse Width: 0.4 ms
Lead Channel Setting Sensing Sensitivity: 2 mV
Zone Setting Status: 755011

## 2024-07-06 NOTE — Progress Notes (Signed)
 Remote PPM Transmission

## 2024-07-10 ENCOUNTER — Ambulatory Visit: Payer: Self-pay | Admitting: Cardiovascular Disease

## 2024-07-17 ENCOUNTER — Other Ambulatory Visit: Payer: Self-pay | Admitting: Physician Assistant

## 2024-10-01 ENCOUNTER — Ambulatory Visit

## 2024-12-31 ENCOUNTER — Ambulatory Visit

## 2025-04-01 ENCOUNTER — Ambulatory Visit
